# Patient Record
Sex: Female | Born: 2000 | Race: White | Hispanic: No | Marital: Single | Smoking: Never smoker
Health system: Southern US, Community
[De-identification: ages and names within clinical notes are randomized; demographics above are authoritative.]

## PROBLEM LIST (undated history)

## (undated) DIAGNOSIS — Z789 Other specified health status: Secondary | ICD-10-CM

## (undated) HISTORY — PX: NO PAST SURGERIES: SHX2092

---
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
## 2021-01-02 ENCOUNTER — Emergency Department (HOSPITAL_COMMUNITY)
Admission: EM | Admit: 2021-01-02 | Discharge: 2021-01-02 | Disposition: A | Discharge status: Home / Self Care | Payer: Medicaid Other | Attending: Emergency Medicine | Admitting: Emergency Medicine

## 2021-01-02 ENCOUNTER — Other Ambulatory Visit: Payer: Self-pay

## 2021-01-02 ENCOUNTER — Emergency Department (HOSPITAL_COMMUNITY): Payer: Medicaid Other

## 2021-01-02 DIAGNOSIS — Z3A01 Less than 8 weeks gestation of pregnancy: Secondary | ICD-10-CM | POA: Diagnosis not present

## 2021-01-02 DIAGNOSIS — E876 Hypokalemia: Secondary | ICD-10-CM | POA: Diagnosis not present

## 2021-01-02 DIAGNOSIS — Z3A11 11 weeks gestation of pregnancy: Secondary | ICD-10-CM | POA: Insufficient documentation

## 2021-01-02 DIAGNOSIS — O26891 Other specified pregnancy related conditions, first trimester: Secondary | ICD-10-CM | POA: Diagnosis present

## 2021-01-02 DIAGNOSIS — R7401 Elevation of levels of liver transaminase levels: Secondary | ICD-10-CM | POA: Diagnosis not present

## 2021-01-02 DIAGNOSIS — O219 Vomiting of pregnancy, unspecified: Secondary | ICD-10-CM | POA: Insufficient documentation

## 2021-01-02 DIAGNOSIS — R000 Tachycardia, unspecified: Secondary | ICD-10-CM | POA: Diagnosis not present

## 2021-01-02 DIAGNOSIS — Z419 Encounter for procedure for purposes other than remedying health state, unspecified: Secondary | ICD-10-CM | POA: Diagnosis not present

## 2021-01-02 DIAGNOSIS — R109 Unspecified abdominal pain: Secondary | ICD-10-CM | POA: Diagnosis not present

## 2021-01-02 DIAGNOSIS — R7989 Other specified abnormal findings of blood chemistry: Secondary | ICD-10-CM

## 2021-01-02 DIAGNOSIS — K760 Fatty (change of) liver, not elsewhere classified: Secondary | ICD-10-CM | POA: Diagnosis not present

## 2021-01-02 DIAGNOSIS — O99281 Endocrine, nutritional and metabolic diseases complicating pregnancy, first trimester: Secondary | ICD-10-CM | POA: Diagnosis not present

## 2021-01-02 LAB — HEPATITIS PANEL, ACUTE
HCV Ab: NONREACTIVE
Hep A IgM: NONREACTIVE
Hep B C IgM: NONREACTIVE
Hepatitis B Surface Ag: NONREACTIVE

## 2021-01-02 LAB — CBC WITH DIFFERENTIAL/PLATELET
Abs Immature Granulocytes: 0.04 10*3/uL (ref 0.00–0.07)
Basophils Absolute: 0.1 10*3/uL (ref 0.0–0.1)
Basophils Relative: 1 %
Eosinophils Absolute: 0.1 10*3/uL (ref 0.0–0.5)
Eosinophils Relative: 1 %
HCT: 42.7 % (ref 36.0–46.0)
Hemoglobin: 15 g/dL (ref 12.0–15.0)
Immature Granulocytes: 0 %
Lymphocytes Relative: 15 %
Lymphs Abs: 1.5 10*3/uL (ref 0.7–4.0)
MCH: 30.2 pg (ref 26.0–34.0)
MCHC: 35.1 g/dL (ref 30.0–36.0)
MCV: 86.1 fL (ref 80.0–100.0)
Monocytes Absolute: 1.1 10*3/uL — ABNORMAL HIGH (ref 0.1–1.0)
Monocytes Relative: 11 %
Neutro Abs: 7 10*3/uL (ref 1.7–7.7)
Neutrophils Relative %: 72 %
Platelets: 284 10*3/uL (ref 150–400)
RBC: 4.96 MIL/uL (ref 3.87–5.11)
RDW: 12.9 % (ref 11.5–15.5)
WBC: 9.7 10*3/uL (ref 4.0–10.5)
nRBC: 0 % (ref 0.0–0.2)

## 2021-01-02 LAB — COMPREHENSIVE METABOLIC PANEL
ALT: 219 U/L — ABNORMAL HIGH (ref 0–44)
AST: 101 U/L — ABNORMAL HIGH (ref 15–41)
Albumin: 3.8 g/dL (ref 3.5–5.0)
Alkaline Phosphatase: 131 U/L — ABNORMAL HIGH (ref 38–126)
Anion gap: 16 — ABNORMAL HIGH (ref 5–15)
BUN: 5 mg/dL — ABNORMAL LOW (ref 6–20)
CO2: 18 mmol/L — ABNORMAL LOW (ref 22–32)
Calcium: 9.4 mg/dL (ref 8.9–10.3)
Chloride: 97 mmol/L — ABNORMAL LOW (ref 98–111)
Creatinine, Ser: 0.77 mg/dL (ref 0.44–1.00)
GFR, Estimated: >60 mL/min (ref >60)
Glucose, Bld: 109 mg/dL — ABNORMAL HIGH (ref 70–99)
Potassium: 2.5 mmol/L — CL (ref 3.5–5.1)
Sodium: 131 mmol/L — ABNORMAL LOW (ref 135–145)
Total Bilirubin: 2.6 mg/dL — ABNORMAL HIGH (ref 0.3–1.2)
Total Protein: 7.7 g/dL (ref 6.5–8.1)

## 2021-01-02 LAB — LIPASE, BLOOD: Lipase: 37 U/L (ref 11–51)

## 2021-01-02 LAB — I-STAT BETA HCG BLOOD, ED (MC, WL, AP ONLY): I-stat hCG, quantitative: >2000 m[IU]/mL — ABNORMAL HIGH (ref <5)

## 2021-01-02 LAB — POTASSIUM: Potassium: 3 mmol/L — ABNORMAL LOW (ref 3.5–5.1)

## 2021-01-02 MED ORDER — POTASSIUM CHLORIDE 10 MEQ/100ML IV SOLN
10.0000 meq | INTRAVENOUS | Status: AC
  Administered 2021-01-02 (×2): 10 meq via INTRAVENOUS
  Filled 2021-01-02 (×2): qty 100

## 2021-01-02 MED ORDER — ONDANSETRON HCL 4 MG/2ML IJ SOLN
4.0000 mg | Freq: Once | INTRAMUSCULAR | Status: AC
  Administered 2021-01-02: 4 mg via INTRAVENOUS
  Filled 2021-01-02: qty 2

## 2021-01-02 MED ORDER — SODIUM CHLORIDE 0.9 % IV BOLUS
2000.0000 mL | Freq: Once | INTRAVENOUS | Status: AC
Start: 2021-01-02 — End: 2021-01-02
  Administered 2021-01-02: 2000 mL via INTRAVENOUS

## 2021-01-02 MED ORDER — POTASSIUM CHLORIDE CRYS ER 20 MEQ PO TBCR
40.0000 meq | EXTENDED_RELEASE_TABLET | Freq: Once | ORAL | Status: AC
  Administered 2021-01-02: 40 meq via ORAL
  Filled 2021-01-02: qty 2

## 2021-01-02 MED ORDER — DOXYLAMINE-PYRIDOXINE 10-10 MG PO TBEC: DELAYED_RELEASE_TABLET | ORAL | 1 refills | Status: DC

## 2021-01-02 NOTE — ED Provider Notes (Signed)
Emergency Medicine Provider OB Triage Evaluation Note  Robin Campbell is a 20 y.o. female, G1P0, at [redacted]w[redacted]d gestation who presents to the emergency department with complaints of nausea and vomiting x2 weeks.  Patient has tried prescription medications with no relief.  Denies associated abdominal pain.  No vaginal bleeding.  Patient has had a ultrasound and confirmed IUP per patient; however unable to review results in chart review.   Review of  Systems  Positive: nausea and vomiting Negative: vaginal bleeding  Physical Exam  LMP 10/15/2020 (Exact Date)  General: Awake, no distress  HEENT: Atraumatic  Resp: Normal effort  Cardiac: Normal rate Abd: Nondistended, nontender  MSK: Moves all extremities without difficulty Neuro: Speech clear  Medical Decision Making  Pt evaluated for pregnancy concern and is stable for transfer to MAU. Pt is in agreement with plan for transfer.  12:38 PM Discussed with MAU APP, Joni Reining  who would like patient worked up in the ED due to elevated HR. MAU is happy see patient once cardiac related issues are ruled out due to tachycardia.  Labs ordered EKG  Clinical Impression  No diagnosis found.     Jesusita Oka 01/02/21 1311    Gerhard Munch, MD 01/02/21 1705

## 2021-01-02 NOTE — ED Provider Notes (Addendum)
[facility] EMERGENCY DEPARTMENT Provider Note   CSN: [id number] Arrival date & time: 01/02/21  1200     History Chief Complaint  Patient presents with   Emesis During Pregnancy    Robin Campbell is a 20 y.o. female with past medical history significant for G1 P0 pregnancy approximately [redacted]w[redacted]d followed by left-sided OB/GYN who presents for evaluation nausea and vomiting.  This is been occurring over the last 2 weeks however worse over the last 48 hours.  States she has been able to keep down any liquids.  She has tried Zofran as well as likely just without relief.  She denies any abdominal pain, back pain, chest pain, shortness of breath, vaginal bleeding, fluid leakage.  She has had an ultrasound which confirmed IUP.  Denies additional aggravating or alleviating factors.   History obtained from patient and past medical records.  No interpreter is used.   HPI     No past medical history on file.  Patient Active Problem List   Diagnosis Date Noted   Encounter for supervision of normal first pregnancy in first trimester 12/11/2020    No past surgical history on file.   OB History     Gravida  1   Para      Term      Preterm      AB      Living         SAB      IAB      Ectopic      Multiple      Live Births              Family History  Problem Relation Age of Onset   Colon cancer Father    Throat cancer Father     Social History   Tobacco Use   Smoking status: Never    Passive exposure: Never   Smokeless tobacco: Never  Vaping Use   Vaping Use: Never used  Substance Use Topics   Alcohol use: Yes   Drug use: Never    Home Medications Prior to Admission medications   Medication Sig Start Date End Date Taking? Authorizing Provider  ondansetron (ZOFRAN ODT) 4 MG disintegrating tablet Take 1 tablet (4 mg total) by mouth every 6 (six) hours as needed for nausea. 12/20/20  Yes Nadara Mustard, MD    Allergies    Patient  has no known allergies.  Review of Systems   Review of Systems  Constitutional: Negative.   HENT: Negative.    Respiratory: Negative.    Cardiovascular: Negative.   Gastrointestinal:  Positive for nausea and vomiting. Negative for abdominal distention, abdominal pain, anal bleeding, blood in stool, constipation, diarrhea and rectal pain.  Genitourinary: Negative.   Musculoskeletal: Negative.   Skin: Negative.   Neurological: Negative.   All other systems reviewed and are negative.  Physical Exam Updated Vital Signs BP 95/62   Pulse 95   Temp 98.2 F (36.8 C) (Oral)   Resp 18   Ht 5\' 3"  (1.6 m)   Wt 58.1 kg   LMP 10/15/2020 (Exact Date)   SpO2 98%   BMI 22.67 kg/m   Physical Exam Vitals and nursing note reviewed.  Constitutional:      General: She is not in acute distress.    Appearance: She is well-developed. She is not ill-appearing, toxic-appearing or diaphoretic.  HENT:     Head: Normocephalic and atraumatic.     Nose: Nose normal.  Mouth/Throat:     Mouth: Mucous membranes are moist.  Eyes:     Pupils: Pupils are equal, round, and reactive to light.  Cardiovascular:     Rate and Rhythm: Normal rate.     Pulses: Normal pulses.     Heart sounds: Normal heart sounds.  Pulmonary:     Effort: Pulmonary effort is normal. No respiratory distress.     Breath sounds: Normal breath sounds.  Abdominal:     General: Bowel sounds are normal. There is no distension.     Palpations: Abdomen is soft.     Tenderness: There is no abdominal tenderness. There is no right CVA tenderness, left CVA tenderness, guarding or rebound. Negative signs include Murphy's sign and McBurney's sign.     Comments: FHT 166  Musculoskeletal:        General: No swelling, tenderness, deformity or signs of injury. Normal range of motion.     Cervical back: Normal range of motion.  Skin:    General: Skin is warm and dry.     Capillary Refill: Capillary refill takes less than 2 seconds.   Neurological:     General: No focal deficit present.     Mental Status: She is alert and oriented to person, place, and time.  Psychiatric:        Mood and Affect: Mood normal.    ED Results / Procedures / Treatments   Labs (all labs ordered are listed, but only abnormal results are displayed) Labs Reviewed  CBC WITH DIFFERENTIAL/PLATELET - Abnormal; Notable for the following components:      Result Value   Monocytes Absolute 1.1 (*)    All other components within normal limits  COMPREHENSIVE METABOLIC PANEL - Abnormal; Notable for the following components:   Sodium 131 (*)    Potassium 2.5 (*)    Chloride 97 (*)    CO2 18 (*)    Glucose, Bld 109 (*)    BUN 5 (*)    AST 101 (*)    ALT 219 (*)    Alkaline Phosphatase 131 (*)    Total Bilirubin 2.6 (*)    Anion gap 16 (*)    All other components within normal limits  I-STAT BETA HCG BLOOD, ED ([facility], [facility], AP ONLY) - Abnormal; Notable for the following components:   I-stat hCG, quantitative >2,000.0 (*)    All other components within normal limits  LIPASE, BLOOD  HEPATITIS PANEL, ACUTE  POTASSIUM    EKG EKG Interpretation  Date/Time:  Thursday January 02 2021 13:20:04 EDT Ventricular Rate:  139 PR Interval:  172 QRS Duration: 70 QT Interval:  284 QTC Calculation: 432 R Axis:   112 Text Interpretation: Suspect arm lead reversal, interpretation assumes no reversal Sinus tachycardia Biatrial enlargement Right axis deviation Abnormal ECG Confirmed by Gerhard Munch [phone]) on 01/02/2021 3:51:51 PM  Radiology US Abdomen Limited RUQ (LIVER/GB)  Result Date: 01/02/2021 CLINICAL DATA:  Elevated LFTs EXAM: ULTRASOUND ABDOMEN LIMITED RIGHT UPPER QUADRANT COMPARISON:  None. FINDINGS: Gallbladder: There is layering sludge in the gallbladder. No gallstones or wall thickening visualized. No sonographic Murphy sign noted by sonographer. Common bile duct: Diameter: 4 mm Liver: Parenchymal echogenicity is mildly increased. No focal  lesion is identified. There is no intrahepatic biliary ductal dilatation. Portal vein is patent on color Doppler imaging with normal direction of blood flow towards the liver. Other: None. IMPRESSION: 1. Mild hepatic steatosis. 2. Layering sludge in the gallbladder. No shadowing stones or evidence of acute cholecystitis. Electronically  Signed   By: Lesia Hausen M.D.   On: 01/02/2021 16:11    Procedures .Critical Care  Date/Time: 01/02/2021 6:19 PM Performed by: Linwood Dibbles, PA-C Authorized by: Linwood Dibbles, PA-C   Critical care provider statement:    Critical care time (minutes):  35   Critical care was necessary to treat or prevent imminent or life-threatening deterioration of the following conditions:  Metabolic crisis (severe hypokalemia)   Critical care was time spent personally by me on the following activities:  Discussions with consultants, evaluation of patient's response to treatment, examination of patient, ordering and performing treatments and interventions, ordering and review of laboratory studies, ordering and review of radiographic studies, pulse oximetry, re-evaluation of patient's condition, obtaining history from patient or surrogate and review of old charts   Medications Ordered in ED Medications  sodium chloride 0.9 % bolus 2,000 mL (0 mLs Intravenous Stopped 01/02/21 1718)  ondansetron (ZOFRAN) injection 4 mg (4 mg Intravenous Given 01/02/21 1507)  potassium chloride 10 mEq in 100 mL IVPB (0 mEq Intravenous Stopped 01/02/21 1718)  potassium chloride SA (KLOR-CON) CR tablet 40 mEq (40 mEq Oral Given 01/02/21 1717)   ED Course  I have reviewed the triage vital signs and the nursing notes.  Pertinent labs & imaging results that were available during my care of the patient were reviewed by me and considered in my medical decision making (see chart for details).  Here for evaluation of nausea vomiting in setting of pregnancy.  She is afebrile, nonseptic,  non-ill-appearing.  Apparently with triage exam MAU would not take patient as she was tachycardic.  Patient denies any chest pain, shortness of breath.  No clinical evidence of VTE on exam.  She does appear very dehydrated.  Suspect her tachycardia is due to this.  We will plan on basic labs, IV fluids, EKG and reassess  Labs personally reviewed and interpreted:  CBC without leukocytosis, hemoglobin 15.0 CMP sodium 131, potassium 2.5, elevated LFTs, anion gap 16 Lipase 37 EKG without ischemic changes, no prolonged qt interval Fetal heart tones 166  Patient reassessed. Discussed labs and imaging. Given IV and PO potassium. Discussed elevated LFT.  She continues to deny any abdominal pain.  Patient states her emesis has been consistent throughout her pregnancy.  Discussed ultrasound with possible hepatic steatosis with some layering however no discrete stones.  Exam not concerning for cholelithiasis, choledocholithiasis.  Patient does state that she does eat fast food very frequently.  Discussed close follow-up with PCP for repeat of her labs.  We will plan on repeat her potassium to ensure this is increasing.  With regards to her emesis she has not had any emesis here in the emergency department.  She is gotten 2 L of IV fluids.  She continues to deny any abdominal pain, vaginal discharge or bleeding.  Collected hepatitis panel.  The patient's tachycardia likely due to dehydration.  I have low suspicion for sepsis, PE as cause of this. No HA, vision changes, abd pain, HTN here to suggest Pre E as cause of her Lft elevation.  Reassessed.  Tolerating p.o. intake.  I offered admission to patient as she does appear dehydrated with significant hypokalemia however patient DECLINES and prefers to go home.  Care transferred to [facility], PA-C who will FU on repeat potassium. If improved can dc home with zofran/ potassium rx and close FU with PCP and Obgyn for repeat labs.     MDM Rules/Calculators/A&P  Final Clinical Impression(s) / ED Diagnoses Final diagnoses:  LFT elevation  Nausea and vomiting during pregnancy  Hypokalemia    Rx / DC Orders ED Discharge Orders     None            Celinda Dethlefs A, PA-C 01/03/21 1025    Gerhard MunchLockwood, Robert, MD 01/07/21 1609

## 2021-01-02 NOTE — ED Triage Notes (Signed)
Patient complains of emesis during pregnancy for the last two weeks, she is 11 weeks and 4 days. Denies abdominal pain, denies vaginal bleeding.

## 2021-01-02 NOTE — ED Provider Notes (Addendum)
Care of the patient received from [facility].  Please see her note for full HPI.  In short, 19 year old G1P0 resents to the ER with complaints of hyperemesis.  She was found to be profoundly hypokalemic with a potassium of 2.5, and a small metabolic acidosis, tachycardic.  Her LFTs were also elevated, hepatitis panel sent off.  Abdominal ultrasound with some gallbladder sludge but no evidence of acute cholecystitis.  Patient was given IV fluids and IV potassium.  She was tolerating oral fluids well.  Care of the patient signed out pending repeat potassium.  Repeat potassium 3.0.  Patient continues to tolerate  oral fluids well.  Pt taking zofran, will prescribe Diclegis as per discussion w/ Dr. Adela Lank.  Stable for discharge at this time.  Case discussed with Dr. Adela Lank is agreeable to the above plan and disposition  Results for orders placed or performed during the hospital encounter of 01/02/21  CBC with Differential  Result Value Ref Range   WBC 9.7 4.0 - 10.5 K/uL   RBC 4.96 3.87 - 5.11 MIL/uL   Hemoglobin 15.0 12.0 - 15.0 g/dL   HCT 58.5 27.7 - 82.4 %   MCV 86.1 80.0 - 100.0 fL   MCH 30.2 26.0 - 34.0 pg   MCHC 35.1 30.0 - 36.0 g/dL   RDW 23.5 36.1 - 44.3 %   Platelets 284 150 - 400 K/uL   nRBC 0.0 0.0 - 0.2 %   Neutrophils Relative % 72 %   Neutro Abs 7.0 1.7 - 7.7 K/uL   Lymphocytes Relative 15 %   Lymphs Abs 1.5 0.7 - 4.0 K/uL   Monocytes Relative 11 %   Monocytes Absolute 1.1 (H) 0.1 - 1.0 K/uL   Eosinophils Relative 1 %   Eosinophils Absolute 0.1 0.0 - 0.5 K/uL   Basophils Relative 1 %   Basophils Absolute 0.1 0.0 - 0.1 K/uL   Immature Granulocytes 0 %   Abs Immature Granulocytes 0.04 0.00 - 0.07 K/uL  Comprehensive metabolic panel  Result Value Ref Range   Sodium 131 (L) 135 - 145 mmol/L   Potassium 2.5 (LL) 3.5 - 5.1 mmol/L   Chloride 97 (L) 98 - 111 mmol/L   CO2 18 (L) 22 - 32 mmol/L   Glucose, Bld 109 (H) 70 - 99 mg/dL   BUN 5 (L) 6 - 20 mg/dL   Creatinine, Ser  1.54 0.44 - 1.00 mg/dL   Calcium 9.4 8.9 - 00.8 mg/dL   Total Protein 7.7 6.5 - 8.1 g/dL   Albumin 3.8 3.5 - 5.0 g/dL   AST 676 (H) 15 - 41 U/L   ALT 219 (H) 0 - 44 U/L   Alkaline Phosphatase 131 (H) 38 - 126 U/L   Total Bilirubin 2.6 (H) 0.3 - 1.2 mg/dL   GFR, Estimated >19 >50 mL/min   Anion gap 16 (H) 5 - 15  Lipase, blood  Result Value Ref Range   Lipase 37 11 - 51 U/L  Potassium  Result Value Ref Range   Potassium 3.0 (L) 3.5 - 5.1 mmol/L  I-Stat Beta hCG blood, ED ([facility], [facility], [facility] only)  Result Value Ref Range   I-stat hCG, quantitative >2,000.0 (H) <5 mIU/mL   Comment 3           US Abdomen Limited RUQ (LIVER/GB)  Result Date: 01/02/2021 CLINICAL DATA:  Elevated LFTs EXAM: ULTRASOUND ABDOMEN LIMITED RIGHT UPPER QUADRANT COMPARISON:  None. FINDINGS: Gallbladder: There is layering sludge in the gallbladder. No gallstones or wall thickening  visualized. No sonographic Murphy sign noted by sonographer. Common bile duct: Diameter: 4 mm Liver: Parenchymal echogenicity is mildly increased. No focal lesion is identified. There is no intrahepatic biliary ductal dilatation. Portal vein is patent on color Doppler imaging with normal direction of blood flow towards the liver. Other: None. IMPRESSION: 1. Mild hepatic steatosis. 2. Layering sludge in the gallbladder. No shadowing stones or evidence of acute cholecystitis. Electronically Signed   By: Lesia Hausen M.D.   On: 01/02/2021 16:11              Melene Plan, DO 01/02/21 1950    Mare Ferrari, PA-C 01/02/21 1956    Melene Plan, DO 01/02/21 1956

## 2021-01-02 NOTE — Discharge Instructions (Addendum)
Your potassium was very low, continue to drink plenty of fluids and follow up with your OBGYN. Please return to the ER for any new or worsening symptoms. Take the prescribed medication for nausea as directed

## 2021-01-02 NOTE — ED Notes (Signed)
Pt transported to Ultrasound.  

## 2021-01-08 ENCOUNTER — Other Ambulatory Visit: Payer: Self-pay

## 2021-01-08 ENCOUNTER — Encounter: Payer: Self-pay | Admitting: Obstetrics and Gynecology

## 2021-01-08 ENCOUNTER — Ambulatory Visit (INDEPENDENT_AMBULATORY_CARE_PROVIDER_SITE_OTHER): Payer: Medicaid Other | Admitting: Obstetrics and Gynecology

## 2021-01-08 VITALS — BP 88/70 | Wt 123.0 lb

## 2021-01-08 DIAGNOSIS — Z1379 Encounter for other screening for genetic and chromosomal anomalies: Secondary | ICD-10-CM | POA: Diagnosis not present

## 2021-01-08 DIAGNOSIS — O210 Mild hyperemesis gravidarum: Secondary | ICD-10-CM

## 2021-01-08 DIAGNOSIS — Z113 Encounter for screening for infections with a predominantly sexual mode of transmission: Secondary | ICD-10-CM

## 2021-01-08 DIAGNOSIS — Z369 Encounter for antenatal screening, unspecified: Secondary | ICD-10-CM

## 2021-01-08 DIAGNOSIS — Z1159 Encounter for screening for other viral diseases: Secondary | ICD-10-CM

## 2021-01-08 DIAGNOSIS — Z3401 Encounter for supervision of normal first pregnancy, first trimester: Secondary | ICD-10-CM

## 2021-01-08 MED ORDER — ONDANSETRON 4 MG PO TBDP: 4.0000 mg | ORAL_TABLET | Freq: Four times a day (QID) | ORAL | 3 refills | Status: DC | PRN

## 2021-01-08 MED ORDER — PROMETHAZINE HCL 25 MG PO TABS: 25.0000 mg | ORAL_TABLET | Freq: Four times a day (QID) | ORAL | 3 refills | Status: DC | PRN

## 2021-01-08 NOTE — Progress Notes (Signed)
Routine Prenatal Care Visit  Subjective  Robin Campbell is a 20 y.o. G1P0 at [redacted]w[redacted]d being seen today for ongoing prenatal care.  She is currently monitored for the following issues for this low-risk pregnancy and has Encounter for supervision of normal first pregnancy in first trimester on their problem list.  ----------------------------------------------------------------------------------- Patient reports  that she has been having issues with nausea or vomiting.  She has very little desire to eat.  She reports that she has lost 30 pounds since her pregnancy began.  She is accompanied by her significant other who reports that she has not eaten in 19 days.  The patient reports that she had small amounts of water and ginger ale this morning.  She was previously hospitalized for nausea during pregnancy. She reports that she is using Zofran previously in pregnancy which helped control her nausea.  She is prescribed a different medication at the hospital but was not able to fill the prescription for this medication.  She reported that it was too expensive.  She has been able to obtain pregnancy Medicaid. Contractions: Not present. Vag. Bleeding: None.  Movement: Absent. Denies leaking of fluid.  ----------------------------------------------------------------------------------- The following portions of the patient's history were reviewed and updated as appropriate: allergies, current medications, past family history, past medical history, past social history, past surgical history and problem list. Problem list updated.   Objective  Blood pressure (!) 88/70, weight 123 lb (55.8 kg), last menstrual period 10/15/2020. Pregravid weight 147 lb (66.7 kg) Total Weight Gain -24 lb (-10.9 kg) Urinalysis:      Fetal Status: Fetal Heart Rate (bpm): 130   Movement: Absent     General:  Alert, oriented and cooperative. Patient is in no acute distress.  Skin: Skin is warm and dry. No rash noted.    Cardiovascular: Normal heart rate noted  Respiratory: Normal respiratory effort, no problems with respiration noted  Abdomen: Soft, gravid, appropriate for gestational age. Pain/Pressure: Absent     Pelvic:  Cervical exam deferred        Extremities: Normal range of motion.  Edema: None  Mental Status: Normal mood and affect. Normal behavior. Normal judgment and thought content.     Assessment   20 y.o. G1P0 at [redacted]w[redacted]d by  07/22/2021, by Last Menstrual Period presenting for routine prenatal visit  Plan   pregnancy 1 Problems (from 12/11/20 to present)     Problem Noted Resolved   Encounter for supervision of normal first pregnancy in first trimester 12/11/2020 by Tresea Mall, CNM No   Overview Addendum 01/08/2021 10:03 AM by Natale Milch, MD     Nursing Staff Provider  Office Location  [facility] Dating  LMP, confirmed 9 wk [place]  Language  English Anatomy US    Flu Vaccine   Genetic Screen  NIPS:   TDaP vaccine    Hgb A1C or  GTT Early : NA Third trimester :   Covid    LAB RESULTS   Rhogam   Blood Type     Feeding Plan  Antibody    Contraception  Rubella    Circumcision  RPR     Pediatrician   HBsAg NON REACTIVE (09/01 1830)   Support Person Boyfriend [place] HIV    Prenatal Classes Advised Varicella     GBS  (For PCN allergy, check sensitivities)   BTL Consent NO    VBAC Consent N/A Pap  Not of age       Pelvis Tested NA  I recommended inpatient admission for patient's severe hyperemesis.  Patient declined. I encouraged the patient to maintain hydration at home.  I recommended popsicles, sucking on hard candy, or chewing gum.  I recommended small frequent snacks trying to consume something every 2 hours.  Reviewed plan of care for management of nausea outpatient.  I sent prescriptions for Phenergan and Zofran to the pharmacy.  NOB and Maternit21 labs today.  Gestational age appropriate obstetric precautions including but not limited to vaginal  bleeding, contractions, leaking of fluid and fetal movement were reviewed in detail with the patient.    Return in about 2 weeks (around 01/22/2021) for ROB with MD.  Natale Milch MD [facility], [facility] 01/08/2021, 10:25 AM

## 2021-01-08 NOTE — Patient Instructions (Signed)
Initial steps to help :   B6 (pyridoxine) 25 mg,  3-4 times a day- 200 mg a day total Unisom (doxylamine) 25 mg at bedtime **B6 and Unisom are available as a combination prescription medications called diclegis and bonjesta  B1 (thiamin)  50-100 mg 1-2 a day-  100 mg a day total  Continue prenatal vitamin with iron and thiamin. If it is not tolerated switch to 1 mg of folic acid.  Can add medication for gastric reflux if needed.  Subsequent steps to be added to B1, B6, and Unisom:  Antihistamine (one of the following medications) Dramamine      25-50 mg every 4-6 hours Benadryl      25-50 mg every 4-6 hours Meclizine      25 mg every 6 hours  2. Dopamine Antagonist (one of the following medications) Metoclopramide  (Reglan)  5-10 mg every 6-8 hours         PO Promethazine   (Phenergan)   12.5-25 mg every 4-6 hours      PO or rectal Prochlorperazine  (Compazine)  5-10 mg every 6-8 hours     25mg BID rectally   Subsequent steps if there has still not been improvement in symptoms:  3. Daily stool softner:  Colace 100 mg twice a day  4. Ondansetron  (Zofran)   4-8 mg every 6-8 hours  Hyperemesis Gravidarum Hyperemesis gravidarum is a severe form of nausea and vomiting that happens during pregnancy. Hyperemesis is worse than morning sickness. It may cause you to have nausea or vomiting all day for many days. It may keep you from eating and drinking enough food and liquids, which can lead to dehydration, malnutrition, and weight loss. Hyperemesis usually occurs during the first half (the first 20 weeks) of pregnancy. It often goes away once a woman is in her second half of pregnancy. However, sometimes hyperemesis continues through anentire pregnancy. What are the causes? The cause of this condition is not known. It may be associated with: Changes in hormones in the body during pregnancy. Changes in the gastrointestinal system. Genetic or inherited conditions. What are the signs or  symptoms? Symptoms of this condition include: Severe nausea and vomiting that does not go away. Problems keeping food down. Weight loss. Loss of body fluid (dehydration). Loss of appetite. You may have no desire to eat or you may not like the food you have previously enjoyed. How is this diagnosed? This condition may be diagnosed based on your medical history, your symptoms,and a physical exam. You may also have other tests, including: Blood tests. Urine tests. Blood pressure tests. Ultrasound to look for problems with the placenta or to check if you are pregnant with more than one baby. How is this treated? This condition is managed by controlling symptoms. This may include: Following an eating plan. This can help to lessen nausea and vomiting. Treatments that do not use medicine. These include acupressure bracelets, hypnosis, and eating or drinking foods or fluids that contain ginger, ginger ale, or ginger tea. Taking prescription medicine or over-the-counter medicine as told by your health care provider. Continuing to take prenatal vitamins. You may need to change what kind you take and when you take them. Follow your health care provider's instructions about prenatal vitamins. An eating plan and medicines are often used together to help control symptoms. If medicines do not help relieve nausea and vomiting, you may need to receivefluids through an IV at the hospital. Follow these instructions at home: To help   hospital. Follow these instructions at home: To help relieve your symptoms, listen to your body. Everyone is different and has different preferences. Find what works best for you. Here are some things you can try to help relieve your symptoms: Meals and snacks  Eat 5-6 small meals daily instead of 3 large meals. Eating small meals and snacks can help you avoid an empty stomach. Before getting out of bed, eat a couple of crackers to avoid moving around on an empty stomach. Eat a protein-rich snack before bed. Examples include  cheese and crackers, or a peanut butter sandwich made with 1 slice of whole-wheat bread and 1 tsp (5 g) of peanut butter. Eat and drink slowly. Try eating starchy foods as these are usually tolerated well. Examples include cereal, toast, bread, potatoes, pasta, rice, and pretzels. Eat at least one serving of protein with your meals and snacks. Protein options include lean meats, poultry, seafood, beans, nuts, nut butters, eggs, cheese, and yogurt. Eat or suck on things that have ginger in them. It may help to relieve nausea. Add  tsp (0.44 g) ground ginger to hot tea, or choose ginger tea. Fluids It is important to stay hydrated. Try to: Drink small amounts of fluids often. Drink fluids 30 minutes before or after a meal to help lessen the feeling of a full stomach. Drink 100% fruit juice or an electrolyte drink. An electrolyte drink contains sodium, potassium, and chloride. Drink fluids that are cold, clear, and carbonated or sour. These include lemonade, ginger ale, lemon-lime soda, ice water, and sparkling water. Things to avoid Avoid the following: Eating foods that trigger your symptoms. These may include spicy foods, coffee, high-fat foods, very sweet foods, and acidic foods. Drinking more than 1 cup of fluid at a time. Skipping meals. Nausea can be more intense on an empty stomach. If you cannot tolerate food, do not force it. Try sucking on ice chips or other frozen items and make up for missed calories later. Lying down within 2 hours after eating. Being exposed to environmental triggers. These may include food smells, smoky rooms, closed spaces, rooms with strong smells, warm or humid places, overly loud and noisy rooms, and rooms with motion or flickering lights. Try eating meals in a well-ventilated area that is free of strong smells. Making quick and sudden changes in your movement. Taking iron pills and multivitamins that contain iron. If you take prescription iron pills, do not stop  taking them unless your health care provider approves. Preparing food. The smell of food can spoil your appetite or trigger nausea. General instructions Brush your teeth or use a mouth rinse after meals. Take over-the-counter and prescription medicines only as told by your health care provider. Follow instructions from your health care provider about eating or drinking restrictions. Talk with your health care provider about starting a supplement of vitamin B6. Continue to take your prenatal vitamins as told by your health care provider. If you are having trouble taking your prenatal vitamins, talk with your health care provider about other options. Keep all follow-up visits. This is important. Follow-up visits include prenatal visits. Contact a health care provider if: You have pain in your abdomen. You have a severe headache. You have vision problems. You are losing weight. You feel weak or dizzy. You cannot eat or drink without vomiting, especially if this goes on for a full day. Get help right away if: You cannot drink fluids without vomiting. You vomit blood. You have constant nausea and vomiting.  a fever and your symptoms suddenly get worse. Summary Hyperemesis gravidarum is a severe form of nausea and vomiting that happens during pregnancy. Making some changes to your eating habits may help relieve nausea and vomiting. This condition may be managed with lifestyle changes and medicines as prescribed by your health care provider. If medicines do not help relieve nausea and vomiting, you may need to receive fluids through an IV at the hospital. This information is not intended to replace advice given to you by your health care provider. Make sure you discuss any questions you have with your healthcare provider. Document Revised: 11/13/2019 Document Reviewed: 11/13/2019 Elsevier Patient Education  2022 [employer]  

## 2021-01-08 NOTE — Progress Notes (Signed)
C/o needs refill of zofran; doesn't feel well today; very nauseous; hard to eat and keep liquids down.

## 2021-01-09 LAB — HEPATITIS C ANTIBODY: Hep C Virus Ab: <0.1 s/co ratio (ref 0.0–0.9)

## 2021-01-09 LAB — RPR+RH+ABO+RUB AB+AB SCR+CB...
Antibody Screen: NEGATIVE
HIV Screen 4th Generation wRfx: NONREACTIVE
Hematocrit: 43.1 % (ref 34.0–46.6)
Hemoglobin: 14.6 g/dL (ref 11.1–15.9)
Hepatitis B Surface Ag: NEGATIVE
MCH: 29.9 pg (ref 26.6–33.0)
MCHC: 33.9 g/dL (ref 31.5–35.7)
MCV: 88 fL (ref 79–97)
Platelets: 255 10*3/uL (ref 150–450)
RBC: 4.89 x10E6/uL (ref 3.77–5.28)
RDW: 13.5 % (ref 11.7–15.4)
RPR Ser Ql: NONREACTIVE
Rh Factor: POSITIVE
Rubella Antibodies, IGG: 3.95 index (ref >0.99)
Varicella zoster IgG: 903 index (ref >165)
WBC: 8.9 10*3/uL (ref 3.4–10.8)

## 2021-01-11 ENCOUNTER — Inpatient Hospital Stay
Admission: EM | Admit: 2021-01-11 | Discharge: 2021-01-15 | DRG: 833 | Disposition: A | Discharge status: Home / Self Care | Payer: Medicaid Other | Attending: Obstetrics and Gynecology | Admitting: Obstetrics and Gynecology

## 2021-01-11 ENCOUNTER — Other Ambulatory Visit: Payer: Self-pay

## 2021-01-11 DIAGNOSIS — Z20822 Contact with and (suspected) exposure to covid-19: Secondary | ICD-10-CM | POA: Diagnosis present

## 2021-01-11 DIAGNOSIS — O99891 Other specified diseases and conditions complicating pregnancy: Secondary | ICD-10-CM | POA: Diagnosis present

## 2021-01-11 DIAGNOSIS — O210 Mild hyperemesis gravidarum: Secondary | ICD-10-CM | POA: Diagnosis not present

## 2021-01-11 DIAGNOSIS — R000 Tachycardia, unspecified: Secondary | ICD-10-CM | POA: Diagnosis present

## 2021-01-11 DIAGNOSIS — R1110 Vomiting, unspecified: Secondary | ICD-10-CM

## 2021-01-11 DIAGNOSIS — O99281 Endocrine, nutritional and metabolic diseases complicating pregnancy, first trimester: Secondary | ICD-10-CM | POA: Diagnosis not present

## 2021-01-11 DIAGNOSIS — Z880 Allergy status to penicillin: Secondary | ICD-10-CM

## 2021-01-11 DIAGNOSIS — O2390 Unspecified genitourinary tract infection in pregnancy, unspecified trimester: Secondary | ICD-10-CM | POA: Diagnosis not present

## 2021-01-11 DIAGNOSIS — O211 Hyperemesis gravidarum with metabolic disturbance: Secondary | ICD-10-CM | POA: Diagnosis not present

## 2021-01-11 DIAGNOSIS — R7401 Elevation of levels of liver transaminase levels: Secondary | ICD-10-CM

## 2021-01-11 DIAGNOSIS — Z3403 Encounter for supervision of normal first pregnancy, third trimester: Secondary | ICD-10-CM

## 2021-01-11 DIAGNOSIS — E860 Dehydration: Secondary | ICD-10-CM

## 2021-01-11 DIAGNOSIS — B9689 Other specified bacterial agents as the cause of diseases classified elsewhere: Secondary | ICD-10-CM | POA: Diagnosis not present

## 2021-01-11 DIAGNOSIS — E876 Hypokalemia: Secondary | ICD-10-CM

## 2021-01-11 DIAGNOSIS — Z3401 Encounter for supervision of normal first pregnancy, first trimester: Secondary | ICD-10-CM

## 2021-01-11 DIAGNOSIS — Z3A12 12 weeks gestation of pregnancy: Secondary | ICD-10-CM | POA: Diagnosis not present

## 2021-01-11 DIAGNOSIS — R824 Acetonuria: Secondary | ICD-10-CM

## 2021-01-11 DIAGNOSIS — R9431 Abnormal electrocardiogram [ECG] [EKG]: Secondary | ICD-10-CM | POA: Diagnosis not present

## 2021-01-11 DIAGNOSIS — R8271 Bacteriuria: Secondary | ICD-10-CM

## 2021-01-11 LAB — CBC
HCT: 40 % (ref 36.0–46.0)
Hemoglobin: 15 g/dL (ref 12.0–15.0)
MCH: 31.1 pg (ref 26.0–34.0)
MCHC: 37.5 g/dL — ABNORMAL HIGH (ref 30.0–36.0)
MCV: 82.8 fL (ref 80.0–100.0)
Platelets: 258 10*3/uL (ref 150–400)
RBC: 4.83 MIL/uL (ref 3.87–5.11)
RDW: 13 % (ref 11.5–15.5)
WBC: 9.3 10*3/uL (ref 4.0–10.5)
nRBC: 0 % (ref 0.0–0.2)

## 2021-01-11 LAB — RESP PANEL BY RT-PCR (FLU A&B, COVID) ARPGX2
Influenza A by PCR: NEGATIVE
Influenza B by PCR: NEGATIVE
SARS Coronavirus 2 by RT PCR: NEGATIVE

## 2021-01-11 LAB — COMPREHENSIVE METABOLIC PANEL
ALT: 332 U/L — ABNORMAL HIGH (ref 0–44)
AST: 191 U/L — ABNORMAL HIGH (ref 15–41)
Albumin: 3.8 g/dL (ref 3.5–5.0)
Alkaline Phosphatase: 111 U/L (ref 38–126)
Anion gap: 14 (ref 5–15)
BUN: <5 mg/dL — ABNORMAL LOW (ref 6–20)
CO2: 26 mmol/L (ref 22–32)
Calcium: 9.2 mg/dL (ref 8.9–10.3)
Chloride: 90 mmol/L — ABNORMAL LOW (ref 98–111)
Creatinine, Ser: 0.54 mg/dL (ref 0.44–1.00)
GFR, Estimated: >60 mL/min (ref >60)
Glucose, Bld: 119 mg/dL — ABNORMAL HIGH (ref 70–99)
Potassium: 2.2 mmol/L — CL (ref 3.5–5.1)
Sodium: 130 mmol/L — ABNORMAL LOW (ref 135–145)
Total Bilirubin: 1.7 mg/dL — ABNORMAL HIGH (ref 0.3–1.2)
Total Protein: 8 g/dL (ref 6.5–8.1)

## 2021-01-11 LAB — MAGNESIUM: Magnesium: 1.8 mg/dL (ref 1.7–2.4)

## 2021-01-11 LAB — HCG, QUANTITATIVE, PREGNANCY: hCG, Beta Chain, Quant, S: 143643 m[IU]/mL — ABNORMAL HIGH (ref <5)

## 2021-01-11 LAB — ACETAMINOPHEN LEVEL: Acetaminophen (Tylenol), Serum: <10 ug/mL — ABNORMAL LOW (ref 10–30)

## 2021-01-11 LAB — LIPASE, BLOOD: Lipase: 36 U/L (ref 11–51)

## 2021-01-11 MED ORDER — THIAMINE HCL 100 MG/ML IJ SOLN
INTRAVENOUS | Status: AC
  Filled 2021-01-11 (×3): qty 1000

## 2021-01-11 MED ORDER — PYRIDOXINE HCL 100 MG/ML IJ SOLN
100.0000 mg | Freq: Once | INTRAMUSCULAR | Status: AC
  Administered 2021-01-11: 100 mg via INTRAVENOUS
  Filled 2021-01-11: qty 1

## 2021-01-11 MED ORDER — ONDANSETRON HCL 4 MG/2ML IJ SOLN: 4.0000 mg | Freq: Three times a day (TID) | INTRAMUSCULAR | Status: DC | PRN

## 2021-01-11 MED ORDER — PROMETHAZINE HCL 25 MG PO TABS
25.0000 mg | ORAL_TABLET | Freq: Once | ORAL | Status: AC
  Administered 2021-01-11: 25 mg via ORAL
  Filled 2021-01-11: qty 1

## 2021-01-11 MED ORDER — METHYLPREDNISOLONE 4 MG PO TABS
16.0000 mg | ORAL_TABLET | Freq: Every day | ORAL | Status: AC
  Administered 2021-01-12 – 2021-01-14 (×3): 16 mg via ORAL
  Filled 2021-01-11 (×3): qty 4

## 2021-01-11 MED ORDER — METHYLPREDNISOLONE 4 MG PO TABS: 4.0000 mg | ORAL_TABLET | Freq: Every day | ORAL | Status: DC

## 2021-01-11 MED ORDER — METHYLPREDNISOLONE SODIUM SUCC 125 MG IJ SOLR
48.0000 mg | Freq: Once | INTRAMUSCULAR | Status: AC
  Administered 2021-01-12: 48 mg via INTRAVENOUS
  Filled 2021-01-11: qty 0.77

## 2021-01-11 MED ORDER — SODIUM CHLORIDE 0.9 % IV SOLN
8.0000 mg | Freq: Three times a day (TID) | INTRAVENOUS | Status: DC | PRN
  Filled 2021-01-11: qty 4

## 2021-01-11 MED ORDER — ALUM & MAG HYDROXIDE-SIMETH 200-200-20 MG/5ML PO SUSP
30.0000 mL | Freq: Once | ORAL | Status: AC
  Administered 2021-01-11: 30 mL via ORAL
  Filled 2021-01-11: qty 30

## 2021-01-11 MED ORDER — VITAMIN B-6 50 MG PO TABS
25.0000 mg | ORAL_TABLET | Freq: Two times a day (BID) | ORAL | Status: DC
  Administered 2021-01-12 – 2021-01-15 (×8): 25 mg via ORAL
  Filled 2021-01-11 (×10): qty 0.5

## 2021-01-11 MED ORDER — POTASSIUM CHLORIDE 10 MEQ/100ML IV SOLN
10.0000 meq | INTRAVENOUS | Status: DC
  Administered 2021-01-11 (×2): 10 meq via INTRAVENOUS
  Filled 2021-01-11 (×2): qty 100

## 2021-01-11 MED ORDER — METHYLPREDNISOLONE 4 MG PO TABS
8.0000 mg | ORAL_TABLET | Freq: Every day | ORAL | Status: DC
  Filled 2021-01-11: qty 2

## 2021-01-11 MED ORDER — METHYLPREDNISOLONE 16 MG PO TABS: 16.0000 mg | ORAL_TABLET | Freq: Every day | ORAL | Status: DC

## 2021-01-11 MED ORDER — METHYLPREDNISOLONE 4 MG PO TABS
4.0000 mg | ORAL_TABLET | Freq: Every day | ORAL | Status: DC
  Filled 2021-01-11: qty 1

## 2021-01-11 MED ORDER — FAMOTIDINE 20 MG PO TABS
20.0000 mg | ORAL_TABLET | Freq: Two times a day (BID) | ORAL | Status: DC
  Administered 2021-01-11 – 2021-01-15 (×8): 20 mg via ORAL
  Filled 2021-01-11 (×8): qty 1

## 2021-01-11 MED ORDER — CEPHALEXIN 500 MG PO CAPS
500.0000 mg | ORAL_CAPSULE | Freq: Once | ORAL | Status: AC
  Administered 2021-01-11: 500 mg via ORAL
  Filled 2021-01-11: qty 1

## 2021-01-11 MED ORDER — METHYLPREDNISOLONE 4 MG PO TABS
8.0000 mg | ORAL_TABLET | Freq: Every day | ORAL | Status: DC
  Administered 2021-01-14: 8 mg via ORAL
  Filled 2021-01-11 (×4): qty 2

## 2021-01-11 MED ORDER — METHYLPREDNISOLONE 4 MG PO TABS
16.0000 mg | ORAL_TABLET | Freq: Every day | ORAL | Status: AC
  Administered 2021-01-12 – 2021-01-15 (×4): 16 mg via ORAL
  Filled 2021-01-11 (×6): qty 4

## 2021-01-11 MED ORDER — DOXYLAMINE SUCCINATE (SLEEP) 25 MG PO TABS
25.0000 mg | ORAL_TABLET | ORAL | Status: AC
  Administered 2021-01-11: 25 mg via ORAL
  Filled 2021-01-11: qty 1

## 2021-01-11 MED ORDER — POTASSIUM CHLORIDE CRYS ER 20 MEQ PO TBCR
40.0000 meq | EXTENDED_RELEASE_TABLET | Freq: Once | ORAL | Status: AC
  Administered 2021-01-11: 40 meq via ORAL
  Filled 2021-01-11: qty 2

## 2021-01-11 MED ORDER — ONDANSETRON HCL 4 MG/2ML IJ SOLN
4.0000 mg | Freq: Once | INTRAMUSCULAR | Status: AC
  Administered 2021-01-11: 4 mg via INTRAVENOUS
  Filled 2021-01-11: qty 2

## 2021-01-11 MED ORDER — PROMETHAZINE HCL 25 MG RE SUPP
12.5000 mg | RECTAL | Status: DC | PRN
  Filled 2021-01-11: qty 1

## 2021-01-11 MED ORDER — METHYLPREDNISOLONE 4 MG PO TABS: 8.0000 mg | ORAL_TABLET | Freq: Every day | ORAL | Status: DC

## 2021-01-11 MED ORDER — DOXYLAMINE SUCCINATE (SLEEP) 25 MG PO TABS
25.0000 mg | ORAL_TABLET | Freq: Two times a day (BID) | ORAL | Status: DC
  Administered 2021-01-12 – 2021-01-15 (×8): 25 mg via ORAL
  Filled 2021-01-11 (×10): qty 1

## 2021-01-11 MED ORDER — DEXTROSE IN LACTATED RINGERS 5 % IV SOLN: INTRAVENOUS | Status: DC

## 2021-01-11 MED ORDER — LACTATED RINGERS IV BOLUS
2000.0000 mL | Freq: Once | INTRAVENOUS | Status: AC
  Administered 2021-01-11: 2000 mL via INTRAVENOUS

## 2021-01-11 MED ORDER — PROMETHAZINE HCL 25 MG PO TABS
12.5000 mg | ORAL_TABLET | ORAL | Status: DC | PRN
  Filled 2021-01-11: qty 1

## 2021-01-11 MED ORDER — HYDROXYZINE HCL 25 MG PO TABS: 50.0000 mg | ORAL_TABLET | Freq: Four times a day (QID) | ORAL | Status: DC | PRN

## 2021-01-11 MED ORDER — FAMOTIDINE 20 MG IN NS 100 ML IVPB
20.0000 mg | Freq: Two times a day (BID) | INTRAVENOUS | Status: DC
  Filled 2021-01-11 (×4): qty 100

## 2021-01-11 MED ORDER — ONDANSETRON 4 MG PO TBDP: 4.0000 mg | ORAL_TABLET | Freq: Three times a day (TID) | ORAL | Status: DC | PRN

## 2021-01-11 MED ORDER — METHYLPREDNISOLONE 4 MG PO TABS
16.0000 mg | ORAL_TABLET | Freq: Every day | ORAL | Status: AC
  Administered 2021-01-12 – 2021-01-13 (×2): 16 mg via ORAL
  Filled 2021-01-11 (×4): qty 4

## 2021-01-11 MED ORDER — HYDROXYZINE HCL 50 MG/ML IM SOLN
50.0000 mg | Freq: Four times a day (QID) | INTRAMUSCULAR | Status: DC | PRN
  Filled 2021-01-11: qty 1

## 2021-01-11 MED ORDER — KCL IN DEXTROSE-NACL 20-5-0.45 MEQ/L-%-% IV SOLN
INTRAVENOUS | Status: DC
  Filled 2021-01-11 (×14): qty 1000

## 2021-01-11 NOTE — ED Notes (Signed)
Report given to Jackie RN

## 2021-01-11 NOTE — ED Triage Notes (Signed)
Pt to ED for emesis, [redacted] weeks pregnant. Has had px meds for emesis not helping. Reports unable to eat

## 2021-01-11 NOTE — H&P (Signed)
Obstetric H&P   Chief Complaint: Nausea and vomiting  Prenatal Care Provider: [facility]  History of Present Illness: 20 y.o. G1P0 [redacted]w[redacted]d by 07/22/2021, by Last Menstrual Period presenting to ER with symptoms of hyperemesis.  Patient noted to be tachycardic and hyperkalemic on presentation.  Symptoms were not controlled on home regimen of Dicelgis, Zofran, and promethazine.  Symptoms have been progressively worsening in the past week with currently no po intolerance.  She declined admission on 9/7.  She has lost 20lbs since start of her pregnancy.  No vaginal bleeding or other pregnancy concerns.    Pregravid weight 66.7 kg Total Weight Gain -10.9 kg  pregnancy 1 Problems (from 12/11/20 to present)     Problem Noted Resolved   Encounter for supervision of normal first pregnancy in first trimester 12/11/2020 by Tresea Mall, CNM No   Overview Addendum 01/08/2021 10:03 AM by Natale Milch, MD     Nursing Staff Provider  Office Location  [facility] Dating  LMP, confirmed 9 wk [place]  Language  English Anatomy US    Flu Vaccine   Genetic Screen  NIPS:   TDaP vaccine    Hgb A1C or  GTT Early : NA Third trimester :   Covid    LAB RESULTS   Rhogam   Blood Type  O pos  Feeding Plan  Antibody  neg  Contraception  Rubella Immune  Circumcision  RPR NR   Pediatrician   HBsAg NON REACTIVE (09/01 1830)   Support Person Boyfriend [place] HIV Negative   Prenatal Classes Advised Varicella Immune    GBS  (For PCN allergy, check sensitivities)   BTL Consent NO    VBAC Consent N/A Pap  Not of age       Pelvis Tested NA                Review of Systems: 10 point review of systems negative unless otherwise noted in HPI  Past Medical History: Patient Active Problem List   Diagnosis Date Noted   Hyperemesis affecting pregnancy, antepartum 01/11/2021   Encounter for supervision of normal first pregnancy in first trimester 12/11/2020     Nursing Staff Provider  Office Location  [facility] Dating   LMP, confirmed 9 wk [place]  Language  English Anatomy US    Flu Vaccine   Genetic Screen  NIPS:   TDaP vaccine    Hgb A1C or  GTT Early : NA Third trimester :   Covid    LAB RESULTS   Rhogam   Blood Type     Feeding Plan  Antibody    Contraception  Rubella    Circumcision  RPR     Pediatrician   HBsAg NON REACTIVE (09/01 1830)   Support Person Boyfriend [place] HIV    Prenatal Classes Advised Varicella     GBS  (For PCN allergy, check sensitivities)   BTL Consent NO    VBAC Consent N/A Pap  Not of age       Pelvis Tested NA          Past Surgical History: History reviewed. No pertinent surgical history.  Past Obstetric History: # 1 - Date: None, Sex: None, Weight: None, GA: None, Delivery: None, Apgar1: None, Apgar5: None, Living: None, Birth Comments: None   Past Gynecologic History:  Family History: Family History  Problem Relation Age of Onset   Colon cancer Father    Throat cancer Father     Social History: Social History  Socioeconomic History   Marital status: Single    Spouse name: Not on file   Number of children: Not on file   Years of education: Not on file   Highest education level: Not on file  Occupational History   Occupation: Retail  Tobacco Use   Smoking status: Never    Passive exposure: Never   Smokeless tobacco: Never  Vaping Use   Vaping Use: Never used  Substance and Sexual Activity   Alcohol use: Yes   Drug use: Never   Sexual activity: Yes    Partners: Male    Birth control/protection: None  Other Topics Concern   Not on file  Social History Narrative   Not on file   Social Determinants of Health   Financial Resource Strain: Not on file  Food Insecurity: Not on file  Transportation Needs: Not on file  Physical Activity: Not on file  Stress: Not on file  Social Connections: Not on file  Intimate Partner Violence: Not on file    Medications: Prior to Admission medications   Medication Sig Start Date End Date Taking?  Authorizing Provider  Doxylamine-Pyridoxine (DICLEGIS) 10-10 MG TBEC Initial, 2 tablets (doxylamine succinate 10 mg/pyridoxine hydrochloride 10 mg per tablet) orally at bedtime on day 1 and 2; if symptoms persist, take 1 tablet in morning and 2 tablets at bedtime on day 3; if symptoms persist, may increase to MAX 4 tablets per day, administered as 1 tablet in the morning, 1 tablet in mid-afternoon and 2 tablets at bedtime Patient not taking: Reported on 01/11/2021 01/02/21   Trudee GripBelaya, Maria A, PA-C  ondansetron (ZOFRAN ODT) 4 MG disintegrating tablet Take 1 tablet (4 mg total) by mouth every 6 (six) hours as needed for nausea. Patient not taking: Reported on 01/11/2021 12/20/20   Nadara MustardHarris, Robert P, MD  ondansetron (ZOFRAN ODT) 4 MG disintegrating tablet Take 1 tablet (4 mg total) by mouth every 6 (six) hours as needed for nausea. 01/08/21   Schuman, Jaquelyn Bitterhristanna R, MD  promethazine (PHENERGAN) 25 MG tablet Take 1 tablet (25 mg total) by mouth every 6 (six) hours as needed for nausea or vomiting. 01/08/21   Natale MilchSchuman, Christanna R, MD    Allergies: No Known Allergies  Physical Exam: Vitals: Blood pressure 107/63, pulse 100, temperature 99 F (37.2 C), temperature source Oral, resp. rate 19, height 5\' 3"  (1.6 m), weight 57.6 kg, last menstrual period 10/15/2020, SpO2 100 %.  General: NAD HEENT: normocephalic, anicteric Pulmonary: No increased work of breathing Cardiovascular: RRR, distal pulses 2+ Abdomen: soft, non-tender Extremities: no edema, erythema, or tenderness Neurologic: Grossly intact Psychiatric: mood appropriate, affect full  Labs: Results for orders placed or performed during the hospital encounter of 01/11/21 (from the past 24 hour(s))  Lipase, blood     Status: None   Collection Time: 01/11/21  3:57 PM  Result Value Ref Range   Lipase 36 11 - 51 U/L  Comprehensive metabolic panel     Status: Abnormal   Collection Time: 01/11/21  3:57 PM  Result Value Ref Range   Sodium 130 (L) 135 -  145 mmol/L   Potassium 2.2 (LL) 3.5 - 5.1 mmol/L   Chloride 90 (L) 98 - 111 mmol/L   CO2 26 22 - 32 mmol/L   Glucose, Bld 119 (H) 70 - 99 mg/dL   BUN <5 (L) 6 - 20 mg/dL   Creatinine, Ser 1.610.54 0.44 - 1.00 mg/dL   Calcium 9.2 8.9 - 09.610.3 mg/dL   Total Protein 8.0 6.5 -  8.1 g/dL   Albumin 3.8 3.5 - 5.0 g/dL   AST 175 (H) 15 - 41 U/L   ALT 332 (H) 0 - 44 U/L   Alkaline Phosphatase 111 38 - 126 U/L   Total Bilirubin 1.7 (H) 0.3 - 1.2 mg/dL   GFR, Estimated >10 >25 mL/min   Anion gap 14 5 - 15  CBC     Status: Abnormal   Collection Time: 01/11/21  3:57 PM  Result Value Ref Range   WBC 9.3 4.0 - 10.5 K/uL   RBC 4.83 3.87 - 5.11 MIL/uL   Hemoglobin 15.0 12.0 - 15.0 g/dL   HCT 85.2 77.8 - 24.2 %   MCV 82.8 80.0 - 100.0 fL   MCH 31.1 26.0 - 34.0 pg   MCHC 37.5 (H) 30.0 - 36.0 g/dL   RDW 35.3 61.4 - 43.1 %   Platelets 258 150 - 400 K/uL   nRBC 0.0 0.0 - 0.2 %  hCG, quantitative, pregnancy     Status: Abnormal   Collection Time: 01/11/21  3:57 PM  Result Value Ref Range   hCG, Beta Chain, Quant, S 143,643 (H) <5 mIU/mL  Magnesium     Status: None   Collection Time: 01/11/21  3:57 PM  Result Value Ref Range   Magnesium 1.8 1.7 - 2.4 mg/dL  Resp Panel by RT-PCR (Flu A&B, Covid) Nasopharyngeal Swab     Status: None   Collection Time: 01/11/21  4:12 PM   Specimen: Nasopharyngeal Swab; Nasopharyngeal(NP) swabs in vial transport medium  Result Value Ref Range   SARS Coronavirus 2 by RT PCR NEGATIVE NEGATIVE   Influenza A by PCR NEGATIVE NEGATIVE   Influenza B by PCR NEGATIVE NEGATIVE  Urinalysis, Complete w Microscopic     Status: Abnormal   Collection Time: 01/11/21  4:21 PM  Result Value Ref Range   Color, Urine YELLOW YELLOW   APPearance CLOUDY (A) CLEAR   Specific Gravity, Urine 1.015 1.005 - 1.030   pH 7.0 5.0 - 8.0   Glucose, UA NEGATIVE NEGATIVE mg/dL   Hgb urine dipstick TRACE (A) NEGATIVE   Bilirubin Urine SMALL (A) NEGATIVE   Ketones, ur >160 (A) NEGATIVE mg/dL    Protein, ur 7.0 (A) NEGATIVE mg/dL   Nitrite NEGATIVE NEGATIVE   Leukocytes,Ua SMALL (A) NEGATIVE   RBC / HPF 6-10 0 - 5 RBC/hpf   WBC, UA 21-50 0 - 5 WBC/hpf   Bacteria, UA FEW (A) NONE SEEN   Squamous Epithelial / LPF 11-20 0 - 5   Mucus PRESENT   Acetaminophen level     Status: Abnormal   Collection Time: 01/11/21  6:00 PM  Result Value Ref Range   Acetaminophen (Tylenol), Serum <10 (L) 10 - 30 ug/mL    Assessment: 20 y.o. G1P0 [redacted]w[redacted]d by 07/22/2021, by Last Menstrual Period with hyperemesis gravidarum  Plan: 1) Hyperemesis gravidarum - clear liquid diet advance as tolerated - D5 KCL at 127mL/hr and once daily banana bag - Repeat electrolytes including magnesium in AM - Prn Zofran, promethazine - Steroid taper - Continue vitamin B6 and doxylamine - Consider addition of reglan  2) Fetus - previable  3) PNL - Blood type O/Positive/-- (09/07 1047) / Anti-bodyscreen Negative (09/07 1047) / Rubella 3.95 (09/07 1047) / Varicella Immune / RPR Non Reactive (09/07 1047) / HBsAg Negative (09/07 1047) / HIV Non Reactive (09/07 1047)   4) Immunization History -   There is no immunization history on file for this patient.  5) Disposition -  pending po tolerance and improvement in symptoms  Vena Austria, MD, [facility], [facility] 01/11/2021, 9:17 PM

## 2021-01-11 NOTE — ED Provider Notes (Signed)
[facility] Emergency Department Provider Note  ____________________________________________   Event Date/Time   First MD Initiated Contact with Patient 01/11/21 [phone]     (approximate)  I have reviewed the triage vital signs and the nursing notes.   HISTORY  Chief Complaint Emesis   HPI Robin Campbell is a 20 y.o. female G1P0 at [redacted]w[redacted]d without other significant past medical history who presents for assessment of nausea and vomiting.  Patient states that is been a week since has been over keep anything down and she has been taking Zofran but this is not seem to help much.  She states she has began to feel lightheaded and dizzy.  She denies any headache, earache, chest pain, cough, fevers, abdominal pain, diarrhea, burning with urination, abnormal vaginal bleeding or discharge, back pain, rash or extremity pain.  No recent falls or injuries.  No other medications at this time.  She did note she was unable to fill her likely just prescribed Pregnancy due to cough.  Of note patient was evaluated by her OB/GYN on 9/7 and at that time was felt to be fairly dehydrated and her OB recommended admission but patient had declined.         History reviewed. No pertinent past medical history.  Patient Active Problem List   Diagnosis Date Noted   Encounter for supervision of normal first pregnancy in first trimester 12/11/2020    History reviewed. No pertinent surgical history.  Prior to Admission medications   Medication Sig Start Date End Date Taking? Authorizing Provider  Doxylamine-Pyridoxine (DICLEGIS) 10-10 MG TBEC Initial, 2 tablets (doxylamine succinate 10 mg/pyridoxine hydrochloride 10 mg per tablet) orally at bedtime on day 1 and 2; if symptoms persist, take 1 tablet in morning and 2 tablets at bedtime on day 3; if symptoms persist, may increase to MAX 4 tablets per day, administered as 1 tablet in the morning, 1 tablet in mid-afternoon and 2 tablets at  bedtime 01/02/21   Sharyn Lull A, PA-C  ondansetron (ZOFRAN ODT) 4 MG disintegrating tablet Take 1 tablet (4 mg total) by mouth every 6 (six) hours as needed for nausea. 12/20/20   Gae Dry, MD  ondansetron (ZOFRAN ODT) 4 MG disintegrating tablet Take 1 tablet (4 mg total) by mouth every 6 (six) hours as needed for nausea. 01/08/21   Schuman, Stefanie Libel, MD  promethazine (PHENERGAN) 25 MG tablet Take 1 tablet (25 mg total) by mouth every 6 (six) hours as needed for nausea or vomiting. 01/08/21   Homero Fellers, MD    Allergies Patient has no known allergies.  Family History  Problem Relation Age of Onset   Colon cancer Father    Throat cancer Father     Social History Social History   Tobacco Use   Smoking status: Never    Passive exposure: Never   Smokeless tobacco: Never  Vaping Use   Vaping Use: Never used  Substance Use Topics   Alcohol use: Yes   Drug use: Never    Review of Systems  Review of Systems  Constitutional:  Negative for chills and fever.  HENT:  Negative for sore throat.   Eyes:  Negative for pain.  Respiratory:  Negative for cough and stridor.   Cardiovascular:  Negative for chest pain.  Gastrointestinal:  Positive for nausea and vomiting.  Genitourinary:  Negative for dysuria.  Musculoskeletal:  Negative for myalgias.  Skin:  Negative for rash.  Neurological:  Negative for seizures, loss of consciousness and  headaches.  Psychiatric/Behavioral:  Negative for suicidal ideas.   All other systems reviewed and are negative.  ____________________________________________   PHYSICAL EXAM:  VITAL SIGNS: ED Triage Vitals  Enc Vitals Group     BP 01/11/21 1554 113/71     Pulse Rate 01/11/21 1554 (!) 127     Resp 01/11/21 1554 20     Temp 01/11/21 1554 97.7 F (36.5 C)     Temp Source 01/11/21 1554 Oral     SpO2 01/11/21 1554 98 %     Weight 01/11/21 1555 123 lb (55.8 kg)     Height 01/11/21 1555 $RemoveBefor'5\' 3"'[id number]$  (1.6 m)     Head Circumference --       Peak Flow --      Pain Score 01/11/21 1554 0     Pain Loc --      Pain Edu? --      Excl. in [place]? --    Vitals:   01/11/21 1712 01/11/21 1800  BP: 103/72 101/69  Pulse: 97 99  Resp: 18 12  Temp:    SpO2: 100% 100%   Physical Exam Vitals and nursing note reviewed.  Constitutional:      General: She is not in acute distress.    Appearance: She is well-developed.  HENT:     Head: Normocephalic and atraumatic.     Right Ear: External ear normal.     Left Ear: External ear normal.     Nose: Nose normal.     Mouth/Throat:     Mouth: Mucous membranes are dry.  Eyes:     Conjunctiva/sclera: Conjunctivae normal.  Cardiovascular:     Rate and Rhythm: Regular rhythm. Tachycardia present.     Heart sounds: No murmur heard. Pulmonary:     Effort: Pulmonary effort is normal. No respiratory distress.     Breath sounds: Normal breath sounds.  Abdominal:     Palpations: Abdomen is soft.     Tenderness: There is no abdominal tenderness. There is no right CVA tenderness or left CVA tenderness.  Musculoskeletal:     Cervical back: Neck supple.     Right lower leg: No edema.     Left lower leg: No edema.  Skin:    General: Skin is warm and dry.     Capillary Refill: Capillary refill takes more than 3 seconds.  Neurological:     Mental Status: She is alert and oriented to person, place, and time.  Psychiatric:        Mood and Affect: Mood normal.    ____________________________________________   LABS (all labs ordered are listed, but only abnormal results are displayed)  Labs Reviewed  COMPREHENSIVE METABOLIC PANEL - Abnormal; Notable for the following components:      Result Value   Sodium 130 (*)    Potassium 2.2 (*)    Chloride 90 (*)    Glucose, Bld 119 (*)    BUN <5 (*)    AST 191 (*)    ALT 332 (*)    Total Bilirubin 1.7 (*)    All other components within normal limits  CBC - Abnormal; Notable for the following components:   MCHC 37.5 (*)    All other  components within normal limits  URINALYSIS, COMPLETE (UACMP) WITH MICROSCOPIC - Abnormal; Notable for the following components:   APPearance CLOUDY (*)    Hgb urine dipstick TRACE (*)    Bilirubin Urine SMALL (*)    Ketones, ur >160 (*)  Protein, ur 7.0 (*)    Leukocytes,Ua SMALL (*)    Bacteria, UA FEW (*)    All other components within normal limits  HCG, QUANTITATIVE, PREGNANCY - Abnormal; Notable for the following components:   hCG, Beta Chain, Quant, S [id number] (*)    All other components within normal limits  RESP PANEL BY RT-PCR (FLU A&B, COVID) [id number]  URINE CULTURE  LIPASE, BLOOD  MAGNESIUM  ACETAMINOPHEN LEVEL   ____________________________________________  EKG  ____________________________________________  RADIOLOGY  ED MD interpretation:    Official radiology report(s): No results found.  ____________________________________________   PROCEDURES  Procedure(s) performed (including Critical Care):  .1-3 Lead EKG Interpretation Performed by: Lucrezia Starch, MD Authorized by: Lucrezia Starch, MD     Interpretation: non-specific     ECG rate assessment: tachycardic     Rhythm: sinus tachycardia     Ectopy: none     Conduction: normal     ____________________________________________   INITIAL IMPRESSION / ASSESSMENT AND PLAN / ED COURSE      Patient presents with above-stated history and exam for assessment of persistent nausea and vomiting in early trimester pregnancy responding to Zofran.  Patient had also been prescribed Phenergan and that since she had not been taking this recently.  She also was seen by OB couple days ago recommend admission at that time the patient declined and now she states she has been feeling worse and is amenable to this if needed.  On arrival she is tachycardic at 127 with otherwise stable vital signs on room air.  On exam her lungs are clear bilaterally abdomen is soft but she does appear extremely dehydrated with  sunken eyes, tachycardia and prolonged capillary refill.  Differential includes hyperemesis related to pregnancy, metabolic derangements, UTI with low suspicion for SBO, appendicitis, diverticulitis or other emergent intra-abdominal pathology at this time.  She has no abdominal pain vaginal bleeding or discharge to suggest PID, endometritis or abruption.  CMP today shows a sodium of 130, K of 2.2 and a transaminitis with an AST of 191 and ALT of 332.  Alk phos is within normal limits and overall there is no significant tenderness of right upper quadrant to suggest acute cholestatic process or acute cholecystitis.  Unclear etiology for demise at this time less than possible is related to hyperemesis.  She recently had a hepatitis panel that was negative within the last month.  Suspicion for acute viral hepatitis.  We will set Tylenol level and the patient denies taking significant mount of Tylenol.  CBC shows no leukocytosis or acute anemia.  Magnesium is within normal limits.  COVID influenza PCR is negative.  hCG is appropriate at [phone].  Patient denies any urinary symptoms UA is concerning for possible cystitis versus asymptomatic bacteriuria given there are some bacteria noted with 21-50 WBCs and small excite esterase.  Patient also has greater 160 ketones consistent with her hyperemesis and very significant dehydration on arrival.  She was treated with 2 L of IV fluids as well as IV and potassium repletion.  She was given below noted antiemetics.  On reassessment after approximately 3 hours she was still slight tachycardic at 103 and states she developed some heartburn.  We will give her some Maalox and order dose of Keflex to treat what I suspect is asymptomatic bacteriuria at this time.  Given she is still tachycardic and degree of dehydration and hypokalemia I will admit to observation to OB/GYN service.      ____________________________________________   FINAL  CLINICAL IMPRESSION(S) / ED  DIAGNOSES  Final diagnoses:  Hyperemesis  Dehydration  Hypokalemia  Asymptomatic bacteriuria during pregnancy  Ketonuria  Transaminitis    Medications  potassium chloride 10 mEq in 100 mL IVPB (10 mEq Intravenous New Bag/Given 01/11/21 1829)  dextrose 5 % in lactated ringers infusion (has no administration in time range)  alum & mag hydroxide-simeth (MAALOX/MYLANTA) 200-200-20 MG/5ML suspension 30 mL (has no administration in time range)  lactated ringers bolus 2,000 mL (2,000 mLs Intravenous New Bag/Given 01/11/21 1637)  pyridOXINE (B-6) injection 100 mg (100 mg Intravenous Given 01/11/21 1634)  ondansetron (ZOFRAN) injection 4 mg (4 mg Intravenous Given 01/11/21 1634)  potassium chloride SA (KLOR-CON) CR tablet 40 mEq (40 mEq Oral Given 01/11/21 1713)  doxylamine (Sleep) (UNISOM) tablet 25 mg (25 mg Oral Given 01/11/21 1713)  promethazine (PHENERGAN) tablet 25 mg (25 mg Oral Given 01/11/21 1829)  cephALEXin (KEFLEX) capsule 500 mg (500 mg Oral Given 01/11/21 1846)     ED Discharge Orders     None        Note:  This document was prepared using Dragon voice recognition software and may include unintentional dictation errors.    Lucrezia Starch, MD 01/11/21 [phone]

## 2021-01-12 DIAGNOSIS — O210 Mild hyperemesis gravidarum: Secondary | ICD-10-CM | POA: Diagnosis not present

## 2021-01-12 DIAGNOSIS — R195 Other fecal abnormalities: Secondary | ICD-10-CM | POA: Diagnosis not present

## 2021-01-12 DIAGNOSIS — E876 Hypokalemia: Secondary | ICD-10-CM | POA: Diagnosis not present

## 2021-01-12 DIAGNOSIS — Z20822 Contact with and (suspected) exposure to covid-19: Secondary | ICD-10-CM | POA: Diagnosis not present

## 2021-01-12 DIAGNOSIS — R000 Tachycardia, unspecified: Secondary | ICD-10-CM | POA: Diagnosis not present

## 2021-01-12 DIAGNOSIS — Z8719 Personal history of other diseases of the digestive system: Secondary | ICD-10-CM | POA: Diagnosis not present

## 2021-01-12 DIAGNOSIS — Z3A13 13 weeks gestation of pregnancy: Secondary | ICD-10-CM | POA: Diagnosis not present

## 2021-01-12 DIAGNOSIS — O99281 Endocrine, nutritional and metabolic diseases complicating pregnancy, first trimester: Secondary | ICD-10-CM | POA: Diagnosis not present

## 2021-01-12 DIAGNOSIS — Z880 Allergy status to penicillin: Secondary | ICD-10-CM | POA: Diagnosis not present

## 2021-01-12 DIAGNOSIS — O99891 Other specified diseases and conditions complicating pregnancy: Secondary | ICD-10-CM | POA: Diagnosis not present

## 2021-01-12 DIAGNOSIS — R9431 Abnormal electrocardiogram [ECG] [EKG]: Secondary | ICD-10-CM | POA: Diagnosis not present

## 2021-01-12 DIAGNOSIS — O211 Hyperemesis gravidarum with metabolic disturbance: Secondary | ICD-10-CM | POA: Diagnosis not present

## 2021-01-12 DIAGNOSIS — Z3A12 12 weeks gestation of pregnancy: Secondary | ICD-10-CM | POA: Diagnosis not present

## 2021-01-12 DIAGNOSIS — B9689 Other specified bacterial agents as the cause of diseases classified elsewhere: Secondary | ICD-10-CM | POA: Diagnosis not present

## 2021-01-12 DIAGNOSIS — R109 Unspecified abdominal pain: Secondary | ICD-10-CM | POA: Diagnosis not present

## 2021-01-12 DIAGNOSIS — R748 Abnormal levels of other serum enzymes: Secondary | ICD-10-CM | POA: Diagnosis not present

## 2021-01-12 DIAGNOSIS — O2390 Unspecified genitourinary tract infection in pregnancy, unspecified trimester: Secondary | ICD-10-CM | POA: Diagnosis not present

## 2021-01-12 LAB — URINALYSIS, COMPLETE (UACMP) WITH MICROSCOPIC
Glucose, UA: NEGATIVE mg/dL
Ketones, ur: >160 mg/dL — AB
Nitrite: NEGATIVE
Protein, ur: 30 mg/dL — AB
Specific Gravity, Urine: 1.015 (ref 1.005–1.030)
pH: 7 (ref 5.0–8.0)

## 2021-01-12 LAB — BASIC METABOLIC PANEL
Anion gap: 5 (ref 5–15)
BUN: <5 mg/dL — ABNORMAL LOW (ref 6–20)
CO2: 28 mmol/L (ref 22–32)
Calcium: 8.1 mg/dL — ABNORMAL LOW (ref 8.9–10.3)
Chloride: 99 mmol/L (ref 98–111)
Creatinine, Ser: 0.44 mg/dL (ref 0.44–1.00)
GFR, Estimated: >60 mL/min (ref >60)
Glucose, Bld: 161 mg/dL — ABNORMAL HIGH (ref 70–99)
Potassium: 3.1 mmol/L — ABNORMAL LOW (ref 3.5–5.1)
Sodium: 132 mmol/L — ABNORMAL LOW (ref 135–145)

## 2021-01-12 LAB — MAGNESIUM: Magnesium: 1.8 mg/dL (ref 1.7–2.4)

## 2021-01-12 NOTE — Progress Notes (Signed)
Pt states she is feeling much better throughout the day. She has ate a clear liquid tray for breakfast and lunch and kept all of it down with no nausea/vomiting.   No pain. Up ad lib. Tolerating all PO meds with no nausea/vomiting.

## 2021-01-12 NOTE — Progress Notes (Signed)
Obstetric and Gynecology  Subjective  Feeling better, kept down some broth.  Overall significant improvement in nausea  Objective  Vital signs in last 24 hours: Temp:  [97.7 F (36.5 C)-99 F (37.2 C)] 98.4 F (36.9 C) (09/11 0746) Pulse Rate:  [84-127] 84 (09/11 0407) Resp:  [12-20] 18 (09/11 0746) BP: (96-113)/(58-72) 96/61 (09/11 0746) SpO2:  [97 %-100 %] 99 % (09/11 0407) Weight:  [55.8 kg-57.6 kg] 57.6 kg (09/10 2040) Last BM Date: 12/23/20   Intake/Output Summary (Last 24 hours) at 01/12/2021 1121 Last data filed at 01/12/2021 1025 Gross per 24 hour  Intake 3462.5 ml  Output 1000 ml  Net 2462.5 ml    General: NAD Pulmonary: no increased work of breathing Extremities: no edema  Labs: Results for orders placed or performed during the hospital encounter of 01/11/21 (from the past 24 hour(s))  Lipase, blood     Status: None   Collection Time: 01/11/21  3:57 PM  Result Value Ref Range   Lipase 36 11 - 51 U/L  Comprehensive metabolic panel     Status: Abnormal   Collection Time: 01/11/21  3:57 PM  Result Value Ref Range   Sodium 130 (L) 135 - 145 mmol/L   Potassium 2.2 (LL) 3.5 - 5.1 mmol/L   Chloride 90 (L) 98 - 111 mmol/L   CO2 26 22 - 32 mmol/L   Glucose, Bld 119 (H) 70 - 99 mg/dL   BUN <5 (L) 6 - 20 mg/dL   Creatinine, Ser 4.48 0.44 - 1.00 mg/dL   Calcium 9.2 8.9 - 18.5 mg/dL   Total Protein 8.0 6.5 - 8.1 g/dL   Albumin 3.8 3.5 - 5.0 g/dL   AST 631 (H) 15 - 41 U/L   ALT 332 (H) 0 - 44 U/L   Alkaline Phosphatase 111 38 - 126 U/L   Total Bilirubin 1.7 (H) 0.3 - 1.2 mg/dL   GFR, Estimated >49 >70 mL/min   Anion gap 14 5 - 15  CBC     Status: Abnormal   Collection Time: 01/11/21  3:57 PM  Result Value Ref Range   WBC 9.3 4.0 - 10.5 K/uL   RBC 4.83 3.87 - 5.11 MIL/uL   Hemoglobin 15.0 12.0 - 15.0 g/dL   HCT 26.3 78.5 - 88.5 %   MCV 82.8 80.0 - 100.0 fL   MCH 31.1 26.0 - 34.0 pg   MCHC 37.5 (H) 30.0 - 36.0 g/dL   RDW 02.7 74.1 - 28.7 %   Platelets 258  150 - 400 K/uL   nRBC 0.0 0.0 - 0.2 %  hCG, quantitative, pregnancy     Status: Abnormal   Collection Time: 01/11/21  3:57 PM  Result Value Ref Range   hCG, Beta Chain, Quant, S 143,643 (H) <5 mIU/mL  Magnesium     Status: None   Collection Time: 01/11/21  3:57 PM  Result Value Ref Range   Magnesium 1.8 1.7 - 2.4 mg/dL  Resp Panel by RT-PCR (Flu A&B, Covid) Nasopharyngeal Swab     Status: None   Collection Time: 01/11/21  4:12 PM   Specimen: Nasopharyngeal Swab; Nasopharyngeal(NP) swabs in vial transport medium  Result Value Ref Range   SARS Coronavirus 2 by RT PCR NEGATIVE NEGATIVE   Influenza A by PCR NEGATIVE NEGATIVE   Influenza B by PCR NEGATIVE NEGATIVE  Urinalysis, Complete w Microscopic     Status: Abnormal   Collection Time: 01/11/21  4:21 PM  Result Value Ref Range   Color, Urine  YELLOW YELLOW   APPearance CLOUDY (A) CLEAR   Specific Gravity, Urine 1.015 1.005 - 1.030   pH 7.0 5.0 - 8.0   Glucose, UA NEGATIVE NEGATIVE mg/dL   Hgb urine dipstick TRACE (A) NEGATIVE   Bilirubin Urine SMALL (A) NEGATIVE   Ketones, ur >160 (A) NEGATIVE mg/dL   Protein, ur 30 (A) NEGATIVE mg/dL   Nitrite NEGATIVE NEGATIVE   Leukocytes,Ua SMALL (A) NEGATIVE   RBC / HPF 6-10 0 - 5 RBC/hpf   WBC, UA 21-50 0 - 5 WBC/hpf   Bacteria, UA FEW (A) NONE SEEN   Squamous Epithelial / LPF 11-20 0 - 5   Mucus PRESENT   Acetaminophen level     Status: Abnormal   Collection Time: 01/11/21  6:00 PM  Result Value Ref Range   Acetaminophen (Tylenol), Serum <10 (L) 10 - 30 ug/mL  Basic metabolic panel     Status: Abnormal   Collection Time: 01/12/21  5:05 AM  Result Value Ref Range   Sodium 132 (L) 135 - 145 mmol/L   Potassium 3.1 (L) 3.5 - 5.1 mmol/L   Chloride 99 98 - 111 mmol/L   CO2 28 22 - 32 mmol/L   Glucose, Bld 161 (H) 70 - 99 mg/dL   BUN <5 (L) 6 - 20 mg/dL   Creatinine, Ser 6.21 0.44 - 1.00 mg/dL   Calcium 8.1 (L) 8.9 - 10.3 mg/dL   GFR, Estimated >30 >86 mL/min   Anion gap 5 5 - 15   Magnesium     Status: None   Collection Time: 01/12/21  5:05 AM  Result Value Ref Range   Magnesium 1.8 1.7 - 2.4 mg/dL    Cultures: Results for orders placed or performed during the hospital encounter of 01/11/21  Resp Panel by RT-PCR (Flu A&B, Covid) Nasopharyngeal Swab     Status: None   Collection Time: 01/11/21  4:12 PM   Specimen: Nasopharyngeal Swab; Nasopharyngeal(NP) swabs in vial transport medium  Result Value Ref Range Status   SARS Coronavirus 2 by RT PCR NEGATIVE NEGATIVE Final    Comment: (NOTE) SARS-CoV-2 target nucleic acids are NOT DETECTED.  The SARS-CoV-2 RNA is generally detectable in upper respiratory specimens during the acute phase of infection. The lowest concentration of SARS-CoV-2 viral copies this assay can detect is 138 copies/mL. A negative result does not preclude SARS-Cov-2 infection and should not be used as the sole basis for treatment or other patient management decisions. A negative result may occur with  improper specimen collection/handling, submission of specimen other than nasopharyngeal swab, presence of viral mutation(s) within the areas targeted by this assay, and inadequate number of viral copies(<138 copies/mL). A negative result must be combined with clinical observations, patient history, and epidemiological information. The expected result is Negative.  Fact Sheet for Patients:  BloggerCourse.com  Fact Sheet for Healthcare Providers:  SeriousBroker.it  This test is no t yet approved or cleared by the [place] FDA and  has been authorized for detection and/or diagnosis of SARS-CoV-2 by FDA under an Emergency Use Authorization (EUA). This EUA will remain  in effect (meaning this test can be used) for the duration of the COVID-19 declaration under Section 564(b)(1) of the Act, 21 U.S.C.section 360bbb-3(b)(1), unless the authorization is terminated  or revoked sooner.        Influenza A by PCR NEGATIVE NEGATIVE Final   Influenza B by PCR NEGATIVE NEGATIVE Final    Comment: (NOTE) The Xpert Xpress SARS-CoV-2/FLU/RSV plus assay is intended as  an aid in the diagnosis of influenza from Nasopharyngeal swab specimens and should not be used as a sole basis for treatment. Nasal washings and aspirates are unacceptable for Xpert Xpress SARS-CoV-2/FLU/RSV testing.  Fact Sheet for Patients: BloggerCourse.com  Fact Sheet for Healthcare Providers: SeriousBroker.it  This test is not yet approved or cleared by the [place] FDA and has been authorized for detection and/or diagnosis of SARS-CoV-2 by FDA under an Emergency Use Authorization (EUA). This EUA will remain in effect (meaning this test can be used) for the duration of the COVID-19 declaration under Section 564(b)(1) of the Act, 21 U.S.C. section 360bbb-3(b)(1), unless the authorization is terminated or revoked.  Performed at [facility], [address] [place], [place] [address]     Imaging:  Assessment   20 y.o. G1P0 [redacted]w[redacted]d Estimated Date of Delivery: 07/22/21 with hyperemesis gravidarum  Plan   1) Hyperemesis gravidarum - clear liquid diet advance as tolerated - D5 KCL at 141mL/hr and once daily banana bag - Repeat electrolytes including magnesium in AM - Prn Zofran, promethazine - Steroid taper - Continue vitamin B6 and doxylamine - Consider addition of reglan if needed  2) Hyperkalemia - improving continue KCL in maintenance fluids

## 2021-01-13 DIAGNOSIS — O210 Mild hyperemesis gravidarum: Secondary | ICD-10-CM | POA: Diagnosis not present

## 2021-01-13 DIAGNOSIS — Z3A12 12 weeks gestation of pregnancy: Secondary | ICD-10-CM | POA: Diagnosis not present

## 2021-01-13 LAB — URINE CULTURE: Culture: NO GROWTH

## 2021-01-13 LAB — BASIC METABOLIC PANEL
Anion gap: 4 — ABNORMAL LOW (ref 5–15)
BUN: <5 mg/dL — ABNORMAL LOW (ref 6–20)
CO2: 29 mmol/L (ref 22–32)
Calcium: 7.9 mg/dL — ABNORMAL LOW (ref 8.9–10.3)
Chloride: 103 mmol/L (ref 98–111)
Creatinine, Ser: <0.3 mg/dL — ABNORMAL LOW (ref 0.44–1.00)
Glucose, Bld: 111 mg/dL — ABNORMAL HIGH (ref 70–99)
Potassium: 3 mmol/L — ABNORMAL LOW (ref 3.5–5.1)
Sodium: 136 mmol/L (ref 135–145)

## 2021-01-13 LAB — MAGNESIUM: Magnesium: 1.8 mg/dL (ref 1.7–2.4)

## 2021-01-13 MED ORDER — DOCUSATE SODIUM 100 MG PO CAPS
100.0000 mg | ORAL_CAPSULE | Freq: Two times a day (BID) | ORAL | Status: DC
  Administered 2021-01-13 – 2021-01-14 (×2): 100 mg via ORAL
  Filled 2021-01-13 (×3): qty 1

## 2021-01-13 NOTE — Progress Notes (Signed)
Subjective: Patient reports feeling much better this morning. She continues with IV fluids and daily IV prenatal viatmin bag. No vomiting in many hours now. She has taken in clear liquids without nausea. Resting quietly   Objective: I have reviewed patient's vital signs, intake and output, medications, and labs. BP (!) 91/56 (BP Location: Left Arm)   Pulse 75   Temp 98.3 F (36.8 C) (Oral)   Resp 20   Ht 5\' 3"  (1.6 m)   Wt 57.6 kg   LMP 10/15/2020 (Exact Date)   SpO2 100%   BMI 22.50 kg/m  Results for orders placed or performed during the hospital encounter of 01/11/21 (from the past 24 hour(s))  Basic metabolic panel     Status: Abnormal   Collection Time: 01/13/21  5:41 AM  Result Value Ref Range   Sodium 136 135 - 145 mmol/L   Potassium 3.0 (L) 3.5 - 5.1 mmol/L   Chloride 103 98 - 111 mmol/L   CO2 29 22 - 32 mmol/L   Glucose, Bld 111 (H) 70 - 99 mg/dL   BUN <5 (L) 6 - 20 mg/dL   Creatinine, Ser 03/15/21 (L) 0.44 - 1.00 mg/dL   Calcium 7.9 (L) 8.9 - 10.3 mg/dL   GFR, Estimated NOT CALCULATED >60 mL/min   Anion gap 4 (L) 5 - 15  Magnesium     Status: None   Collection Time: 01/13/21  5:41 AM  Result Value Ref Range   Magnesium 1.8 1.7 - 2.4 mg/dL     General: alert, cooperative, fatigued, and no distress Resp: clear to auscultation bilaterally Cardio: regular rate and rhythm, S1, S2 normal, no murmur, click, rub or gallop GI: soft, non-tender; bowel sounds normal; no masses,  no organomegaly and FHTS 150s , aucultated with hand held dippler. Extremities: extremities normal, atraumatic, no cyanosis or edema and no edema, redness or tenderness in the calves or thighs   Assessment/Plan: IUP 12+ weeks gestation  Hyperemesis gravidarum Responding well to IV rehydration, steroids, "Banana bag"  Will advance to solids today. Rechck her CMP in the am.  LOS: 1 day    03/15/21 01/13/2021, 11:00 AM

## 2021-01-14 DIAGNOSIS — R748 Abnormal levels of other serum enzymes: Secondary | ICD-10-CM

## 2021-01-14 DIAGNOSIS — R195 Other fecal abnormalities: Secondary | ICD-10-CM

## 2021-01-14 DIAGNOSIS — Z3A13 13 weeks gestation of pregnancy: Secondary | ICD-10-CM

## 2021-01-14 DIAGNOSIS — E876 Hypokalemia: Secondary | ICD-10-CM

## 2021-01-14 DIAGNOSIS — R109 Unspecified abdominal pain: Secondary | ICD-10-CM | POA: Diagnosis not present

## 2021-01-14 DIAGNOSIS — O210 Mild hyperemesis gravidarum: Secondary | ICD-10-CM | POA: Diagnosis not present

## 2021-01-14 DIAGNOSIS — Z8719 Personal history of other diseases of the digestive system: Secondary | ICD-10-CM

## 2021-01-14 LAB — COMPREHENSIVE METABOLIC PANEL
ALT: 193 U/L — ABNORMAL HIGH (ref 0–44)
AST: 76 U/L — ABNORMAL HIGH (ref 15–41)
Albumin: 3.2 g/dL — ABNORMAL LOW (ref 3.5–5.0)
Alkaline Phosphatase: 78 U/L (ref 38–126)
Anion gap: 7 (ref 5–15)
BUN: <5 mg/dL — ABNORMAL LOW (ref 6–20)
CO2: 28 mmol/L (ref 22–32)
Calcium: 8.5 mg/dL — ABNORMAL LOW (ref 8.9–10.3)
Chloride: 101 mmol/L (ref 98–111)
Creatinine, Ser: 0.43 mg/dL — ABNORMAL LOW (ref 0.44–1.00)
GFR, Estimated: >60 mL/min (ref >60)
Glucose, Bld: 112 mg/dL — ABNORMAL HIGH (ref 70–99)
Potassium: 3.2 mmol/L — ABNORMAL LOW (ref 3.5–5.1)
Sodium: 136 mmol/L (ref 135–145)
Total Bilirubin: 0.8 mg/dL (ref 0.3–1.2)
Total Protein: 6.1 g/dL — ABNORMAL LOW (ref 6.5–8.1)

## 2021-01-14 LAB — GASTROINTESTINAL PANEL BY PCR, STOOL (REPLACES STOOL CULTURE)

## 2021-01-14 MED ORDER — POTASSIUM CHLORIDE 10 MEQ/100ML IV SOLN
10.0000 meq | INTRAVENOUS | Status: AC
Start: 2021-01-14 — End: 2021-01-15
  Administered 2021-01-14 – 2021-01-15 (×4): 10 meq via INTRAVENOUS
  Filled 2021-01-14 (×4): qty 100

## 2021-01-14 MED ORDER — PROSOURCE PLUS PO LIQD: 30.0000 mL | Freq: Two times a day (BID) | ORAL | Status: DC

## 2021-01-14 MED ORDER — BOOST / RESOURCE BREEZE PO LIQD CUSTOM: 1.0000 | Freq: Three times a day (TID) | ORAL | Status: DC

## 2021-01-14 MED ORDER — LOPERAMIDE HCL 2 MG PO CAPS
2.0000 mg | ORAL_CAPSULE | ORAL | Status: DC | PRN
  Filled 2021-01-14: qty 1

## 2021-01-14 NOTE — Progress Notes (Signed)
Initial Nutrition Assessment RD working remotely.  DOCUMENTATION CODES:   Not applicable  INTERVENTION:  - will order Boost Breeze BID, each supplement provides 250 kcal and 9 grams of protein. - will order 30 ml Prosource Plus BID, each supplement provides 100 kcal and 15 grams protein.  - complete NFPE when feasible.  - consider adding probiotic.  - if patient unable to tolerate adequate PO nutrition, recommend post-pyloric small bore NGT and initiation of TF.    NUTRITION DIAGNOSIS:   Inadequate oral intake related to acute illness, nausea, vomiting as evidenced by per patient/family report.  GOAL:   Patient will meet greater than or equal to 90% of their needs  MONITOR:   PO intake, Supplement acceptance, Labs, Weight trends, I & O's  REASON FOR ASSESSMENT:   Malnutrition Screening Tool  ASSESSMENT:   20 year old female with no past medical history. She is G1P0; [redacted]w[redacted]d by 07/22/2021. She presented to the ED due to symptoms of hyperemesis. On presentation she was noted to be hyperkalemic and tachycardic. She reported 20 lb weight loss since the beginning of pregnancy.  Diet advanced to CLD on 9/11 at 0100 and to Soft yesterday at 1354. Despite diet advancement, she has still been mainly opting to consume jello and broth at meals.   She outlines that she often experiences frequent BMs and urgency with BMs at baseline, often having 5 BMs/day. She has previously taken over the counter digestive support vitamins but is unsure what they are called. She has had multiple loose BMs in the past 24 hours.   She reports nausea has greatly improved and she has not had any episodes of emesis the past 2 days.   GI is consulted and note from today reviewed.  Weight today is 128 lb, weight on 9/10 was 123 lb, and weight on 8/10 was 141 lb. This indicates 13 lb weight loss (9% body weight) in the past 1 month. Suspect some degree of malnutrition but unable to confirm without completing  NFPE.    Labs reviewed; K: 3.2 mmol/l, BUN: <5 mg/dl, creatinine: 1.61 mg/dl, Ca: 8.5 mg/dl, LFTs elevated but down from 9/12. Medications reviewed; 100 mg colace BID, 20 mg oral pepcid BID, medrol taper, PRN oral and IV zofran and phenergan, 25 mg vitamin B6 BID. IVF; D5-1/2 NS-20 mEq IV KCl @ 125 ml/hr (510 kcal/24 hrs).     NUTRITION - FOCUSED PHYSICAL EXAM:  Unable to complete.  Diet Order:   Diet Order             DIET SOFT Room service appropriate? Yes; Fluid consistency: Thin  Diet effective now                   EDUCATION NEEDS:   Not appropriate for education at this time  Skin:  Skin Assessment: Reviewed RN Assessment  Last BM:  PTA/unknown  Height:   Ht Readings from Last 1 Encounters:  01/11/21 5\' 3"  (1.6 m) (31 %, Z= -0.51)*   * Growth percentiles are based on CDC (Girls, 2-20 Years) data.    Weight:   Wt Readings from Last 1 Encounters:  01/14/21 58.1 kg (51 %, Z= 0.03)*   * Growth percentiles are based on CDC (Girls, 2-20 Years) data.     Estimated Nutritional Needs:  Kcal:  1800-2000 kcal Protein:  90-105 grams Fluid:  >/= 2.5 L/day      01/16/21, MS, RD, LDN, CNSC Inpatient Clinical Dietitian RD pager # available in [facility]  After hours/weekend pager # available in [facility]

## 2021-01-14 NOTE — Progress Notes (Signed)
Subjective:  She reports that she has been having less nausea and vomiting.  However since she is able to eat juice and Jell-O yesterday she has had issues with needing to have multiple bowel movements.  She reports that even prior to pregnancy she was having issues with urgency and defecation.  She has been wondering if she has IBS.  She reports that she generally was using the bathroom and having bowel movements more than 5 times a day.  She has never previously seen a GI doctor.  She reports that this has been a long-term issue for her that has worsens since she graduated high school.  She denies any blood or mucus in her stools.  She reports that her stools are generally brown or yellow in color.  She reports that the bowel movements are generally loose more than solid.  When she does have a small solid bowel movement is generally very small in amount.  In the past she was taking a digestive vitamin over-the-counter and a gummy form which helped her.  She does not remember the name of the digestive benefit vitamin.  She reports that prior to pregnancy she was eating more fatty fast food but now that food does not appetizer.  She reports that yesterday for breakfast she ate chicken broth and Jell-O.  For lunch she ate beef broth and Jell-O.  She is not able to eat dinner because her her stomach started hurting again.  She has not had any nausea or vomiting since she was originally admitted on the 11th.  She has had elevated liver enzymes this pregnancy and her prior abdominal ultrasound had shown sludge in her gallbladder as well as hepatic steatosis.  10.9 kg weight loss this pregnancy.  Objective:   Blood pressure (!) 99/59, pulse 81, temperature 98.5 F (36.9 C), temperature source Oral, resp. rate 18, height 5\' 3"  (1.6 m), weight 58.1 kg, last menstrual period 10/15/2020, SpO2 98 %.  General: NAD Pulmonary: no increased work of breathing Abdomen: non-distended, diffusely tender, no rebound, no  guarding Uterus:  gravid Extremities: no edema, no erythema, no tenderness, no signs of DVT  Results for orders placed or performed during the hospital encounter of 01/11/21 (from the past 72 hour(s))  Lipase, blood     Status: None   Collection Time: 01/11/21  3:57 PM  Result Value Ref Range   Lipase 36 11 - 51 U/L    Comment: Performed at [facility], [address] [place], [place]  Comprehensive metabolic panel     Status: Abnormal   Collection Time: 01/11/21  3:57 PM  Result Value Ref Range   Sodium 130 (L) 135 - 145 mmol/L   Potassium 2.2 (LL) 3.5 - 5.1 mmol/L    Comment: CRITICAL RESULT CALLED TO, READ BACK BY AND VERIFIED WITH CRYSTAL GUALDONI RN @1636  01/11/21 SCS    Chloride 90 (L) 98 - 111 mmol/L   CO2 26 22 - 32 mmol/L   Glucose, Bld 119 (H) 70 - 99 mg/dL    Comment: Glucose reference range applies only to samples taken after fasting for at least 8 hours.   BUN <5 (L) 6 - 20 mg/dL   Creatinine, Ser 0.44 - 1.00 mg/dL   Calcium 9.2 8.9 - 03/13/21 mg/dL   Total Protein 8.0 6.5 - 8.1 g/dL   Albumin 3.8 3.5 - 5.0 g/dL   AST 6.75 (H) 15 - 41 U/L   ALT 332 (H) 0 - 44 U/L  Alkaline Phosphatase 111 38 - 126 U/L   Total Bilirubin 1.7 (H) 0.3 - 1.2 mg/dL   GFR, Estimated >85 >27 mL/min    Comment: (NOTE) Calculated using the CKD-EPI Creatinine Equation (2021)    Anion gap 14 5 - 15    Comment: Performed at [facility], [address] [place], [place] [address]  CBC     Status: Abnormal   Collection Time: 01/11/21  3:57 PM  Result Value Ref Range   WBC 9.3 4.0 - 10.5 K/uL   RBC 4.83 3.87 - 5.11 MIL/uL   Hemoglobin 15.0 12.0 - 15.0 g/dL   HCT 35.3 61.4 - 43.1 %   MCV 82.8 80.0 - 100.0 fL   MCH 31.1 26.0 - 34.0 pg   MCHC 37.5 (H) 30.0 - 36.0 g/dL   RDW 54.0 08.6 - 76.1 %   Platelets 258 150 - 400 K/uL   nRBC 0.0 0.0 - 0.2 %    Comment: Performed at [facility], [address] [place], [place] [address]  hCG, quantitative,  pregnancy     Status: Abnormal   Collection Time: 01/11/21  3:57 PM  Result Value Ref Range   hCG, Beta Chain, Quant, S 143,643 (H) <5 mIU/mL    Comment:          GEST. AGE      CONC.  (mIU/mL)   <=1 WEEK        5 - 50     2 WEEKS       50 - 500     3 WEEKS       100 - 10,000     4 WEEKS     1,000 - 30,000     5 WEEKS     3,500 - 115,000   6-8 WEEKS     12,000 - 270,000    12 WEEKS     15,000 - 220,000        FEMALE AND NON-PREGNANT FEMALE:     LESS THAN 5 mIU/mL Performed at [facility], [address] [place], [place] [address]   Magnesium     Status: None   Collection Time: 01/11/21  3:57 PM  Result Value Ref Range   Magnesium 1.8 1.7 - 2.4 mg/dL    Comment: Performed at [facility], [address] [place], [place] [address]  Resp Panel by RT-PCR (Flu A&B, Covid) Nasopharyngeal Swab     Status: None   Collection Time: 01/11/21  4:12 PM   Specimen: Nasopharyngeal Swab; Nasopharyngeal(NP) swabs in vial transport medium  Result Value Ref Range   SARS Coronavirus 2 by RT PCR NEGATIVE NEGATIVE    Comment: (NOTE) SARS-CoV-2 target nucleic acids are NOT DETECTED.  The SARS-CoV-2 RNA is generally detectable in upper respiratory specimens during the acute phase of infection. The lowest concentration of SARS-CoV-2 viral copies this assay can detect is 138 copies/mL. A negative result does not preclude SARS-Cov-2 infection and should not be used as the sole basis for treatment or other patient management decisions. A negative result may occur with  improper specimen collection/handling, submission of specimen other than nasopharyngeal swab, presence of viral mutation(s) within the areas targeted by this assay, and inadequate number of viral copies(<138 copies/mL). A negative result must be combined with clinical observations, patient history, and epidemiological information. The expected result is Negative.  Fact Sheet for Patients:   BloggerCourse.com  Fact Sheet for Healthcare Providers:  SeriousBroker.it  This test is no t  yet approved or cleared by the [place] and  has been authorized for detection and/or diagnosis of SARS-CoV-2 by FDA under an Emergency Use Authorization (EUA). This EUA will remain  in effect (meaning this test can be used) for the duration of the COVID-19 declaration under Section 564(b)(1) of the Act, 21 U.S.C.section 360bbb-3(b)(1), unless the authorization is terminated  or revoked sooner.       Influenza A by PCR NEGATIVE NEGATIVE   Influenza B by PCR NEGATIVE NEGATIVE    Comment: (NOTE) The Xpert Xpress SARS-CoV-2/FLU/RSV plus assay is intended as an aid in the diagnosis of influenza from Nasopharyngeal swab specimens and should not be used as a sole basis for treatment. Nasal washings and aspirates are unacceptable for Xpert Xpress SARS-CoV-2/FLU/RSV testing.  Fact Sheet for Patients: BloggerCourse.com  Fact Sheet for Healthcare Providers: SeriousBroker.it  This test is not yet approved or cleared by the [place] FDA and has been authorized for detection and/or diagnosis of SARS-CoV-2 by FDA under an Emergency Use Authorization (EUA). This EUA will remain in effect (meaning this test can be used) for the duration of the COVID-19 declaration under Section 564(b)(1) of the Act, 21 U.S.C. section 360bbb-3(b)(1), unless the authorization is terminated or revoked.  Performed at [facility], [address] [place], [place] [address]   Urinalysis, Complete w Microscopic     Status: Abnormal   Collection Time: 01/11/21  4:21 PM  Result Value Ref Range   Color, Urine YELLOW YELLOW   APPearance CLOUDY (A) CLEAR   Specific Gravity, Urine 1.015 1.005 - 1.030   pH 7.0 5.0 - 8.0   Glucose, UA NEGATIVE NEGATIVE mg/dL   Hgb urine dipstick TRACE (A) NEGATIVE    Bilirubin Urine SMALL (A) NEGATIVE   Ketones, ur >160 (A) NEGATIVE mg/dL   Protein, ur 30 (A) NEGATIVE mg/dL    Comment: CORRECTED ON 09/11 AT 0309: PREVIOUSLY REPORTED AS 7.0   Nitrite NEGATIVE NEGATIVE   Leukocytes,Ua SMALL (A) NEGATIVE   RBC / HPF 6-10 0 - 5 RBC/hpf   WBC, UA 21-50 0 - 5 WBC/hpf   Bacteria, UA FEW (A) NONE SEEN   Squamous Epithelial / LPF 11-20 0 - 5   Mucus PRESENT     Comment: Performed at [facility], [address] [place], [place] [address]  Urine Culture     Status: None   Collection Time: 01/11/21  4:21 PM   Specimen: Urine, Random  Result Value Ref Range   Specimen Description      URINE, RANDOM Performed at [facility], [address], [place], [place] [address]    Special Requests      NONE Performed at [facility], [address] [place], [place] [address]    Culture      NO GROWTH Performed at [facility], [address], [place], [place] [address]    Report Status 01/13/2021 FINAL   Acetaminophen level     Status: Abnormal   Collection Time: 01/11/21  6:00 PM  Result Value Ref Range   Acetaminophen (Tylenol), Serum <10 (L) 10 - 30 ug/mL    Comment: (NOTE) Therapeutic concentrations vary significantly. A range of 10-30 ug/mL  may be an effective concentration for many patients. However, some  are best treated at concentrations outside of this range. Acetaminophen concentrations >150 ug/mL at 4 hours after ingestion  and >50 ug/mL at 12 hours after ingestion are often associated with  toxic reactions.  Performed at [facility], [address] [place], [place] [address]   Basic metabolic panel     Status: Abnormal   Collection Time: 01/12/21  5:05 AM  Result Value Ref Range   Sodium 132 (L) 135 - 145 mmol/L   Potassium 3.1 (L) 3.5 - 5.1 mmol/L   Chloride 99 98 - 111 mmol/L   CO2 28 22 - 32 mmol/L   Glucose, Bld 161 (H) 70 - 99 mg/dL    Comment: Glucose reference range applies only  to samples taken after fasting for at least 8 hours.   BUN <5 (L) 6 - 20 mg/dL   Creatinine, Ser 8.14 0.44 - 1.00 mg/dL   Calcium 8.1 (L) 8.9 - 10.3 mg/dL   GFR, Estimated >48 >18 mL/min    Comment: (NOTE) Calculated using the CKD-EPI Creatinine Equation (2021)    Anion gap 5 5 - 15    Comment: Performed at [facility], [address] [place], [place] [address]  Magnesium     Status: None   Collection Time: 01/12/21  5:05 AM  Result Value Ref Range   Magnesium 1.8 1.7 - 2.4 mg/dL    Comment: Performed at [facility], [address] [place], [place] [address]  Basic metabolic panel     Status: Abnormal   Collection Time: 01/13/21  5:41 AM  Result Value Ref Range   Sodium 136 135 - 145 mmol/L   Potassium 3.0 (L) 3.5 - 5.1 mmol/L   Chloride 103 98 - 111 mmol/L   CO2 29 22 - 32 mmol/L   Glucose, Bld 111 (H) 70 - 99 mg/dL    Comment: Glucose reference range applies only to samples taken after fasting for at least 8 hours.   BUN <5 (L) 6 - 20 mg/dL   Creatinine, Ser <3.78 (L) 0.44 - 1.00 mg/dL   Calcium 7.9 (L) 8.9 - 10.3 mg/dL   GFR, Estimated NOT CALCULATED >60 mL/min    Comment: (NOTE) Calculated using the CKD-EPI Creatinine Equation (2021)    Anion gap 4 (L) 5 - 15    Comment: Performed at [facility], [address], [place], [place] [address]  Magnesium     Status: None   Collection Time: 01/13/21  5:41 AM  Result Value Ref Range   Magnesium 1.8 1.7 - 2.4 mg/dL    Comment: Performed at [facility], [address] [place], [place] [address]  Comprehensive metabolic panel     Status: Abnormal   Collection Time: 01/14/21  6:18 AM  Result Value Ref Range   Sodium 136 135 - 145 mmol/L   Potassium 3.2 (L) 3.5 - 5.1 mmol/L   Chloride 101 98 - 111 mmol/L   CO2 28 22 - 32 mmol/L   Glucose, Bld 112 (H) 70 - 99 mg/dL    Comment: Glucose reference range applies only to samples taken after fasting for at least 8 hours.   BUN <5 (L) 6 -  20 mg/dL   Creatinine, Ser 2.87 (L) 0.44 - 1.00 mg/dL   Calcium 8.5 (L) 8.9 - 10.3 mg/dL   Total Protein 6.1 (L) 6.5 - 8.1 g/dL   Albumin 3.2 (L) 3.5 - 5.0 g/dL   AST 76 (H) 15 - 41 U/L   ALT 193 (H) 0 - 44 U/L   Alkaline Phosphatase 78 38 - 126 U/L   Total Bilirubin 0.8 0.3 - 1.2 mg/dL   GFR, Estimated >86 >76 mL/min    Comment: (  NOTE) Calculated using the CKD-EPI Creatinine Equation (2021)    Anion gap 7 5 - 15    Comment: Performed at [facility], [address] [place], [place] [address]    Assessment:   20 y.o. G1P0 hospital day #2  Plan:   1) Hyperemesis: Patient has been on started on a steroid taper for hyperemesis.  She is also receiving B6 and Unisom.  She has orders for Zofran Phenergan and Vistaril.  She is on IV fluids with vitamin replacement. She is receiving pepcid.  2) GI concerns and elevated liver enzymes. History of hepatic steatosis. Will consult GI for assistance in evaluation and treatment options.   3) Advance diet as tolerated  4) Continue care inpatient today.   Adelene Idler MD [facility], [facility] 01/14/2021 10:55 AM

## 2021-01-14 NOTE — Consult Note (Signed)
Melodie Bouillon, MD [address], [address], [place], [place], [address] [address], [place], [place], [address] Phone: [phone]  Fax: [phone]  Consultation  Referring Provider:     Dr. Jerene Pitch Primary Care Physician:  Velvet Bathe, MD Reason for Consultation:     Diarrhea, stomach pain, hyperemesis, elevated liver enzymes, fatty liver on ultrasound  Date of Admission:  01/11/2021 Date of Consultation:  01/14/2021         HPI:   Janeka Libman is a 20 y.o. female G1, P0, 12+ weeks gestation, admitted with nausea vomiting, diagnosed with hyperemesis gravidarum with GI consulted for the above.  Patient reports onset of nausea vomiting symptoms since her pregnancy and did not have nausea vomiting prior to this.  Reports decrease in appetite and poor p.o. intake due to this.  However, prior to pregnancy she describes having intermittent episodes of abdominal pain, and loose stools, but this did not affect her appetite and she used to eat well.  She describes having loose bowel movements with urgency, about once a week, with bilateral lower quadrant abdominal pain that typically would start after 2 to 3 days of not having a bowel movement.  Dull, 5 or 10, nonradiating.  It would improve somewhat after having a bowel movement but she would have multiple loose stools for 2 to 3 days.  She never sought medical care for the symptoms.  She reports that the symptoms seems to have worsened recently.  As of yesterday has had recurrence of her loose stools that she remembers having prior to pregnancy.  No blood in stool.  No melena.  Denies any hematemesis or coffee-ground emesis.  PMHx: Pt denies any medical problems. Denies ever being on medications and does not see a PCP  Past surgical History: Denies any surgeries recently or even as a child  Prior to Admission medications   Medication Sig Start Date End Date Taking? Authorizing Provider  Doxylamine-Pyridoxine (DICLEGIS)  10-10 MG TBEC Initial, 2 tablets (doxylamine succinate 10 mg/pyridoxine hydrochloride 10 mg per tablet) orally at bedtime on day 1 and 2; if symptoms persist, take 1 tablet in morning and 2 tablets at bedtime on day 3; if symptoms persist, may increase to MAX 4 tablets per day, administered as 1 tablet in the morning, 1 tablet in mid-afternoon and 2 tablets at bedtime Patient not taking: Reported on 01/11/2021 01/02/21   Trudee Grip A, PA-C  ondansetron (ZOFRAN ODT) 4 MG disintegrating tablet Take 1 tablet (4 mg total) by mouth every 6 (six) hours as needed for nausea. Patient not taking: Reported on 01/11/2021 12/20/20   Nadara Mustard, MD  ondansetron (ZOFRAN ODT) 4 MG disintegrating tablet Take 1 tablet (4 mg total) by mouth every 6 (six) hours as needed for nausea. 01/08/21   Schuman, Jaquelyn Bitter, MD  promethazine (PHENERGAN) 25 MG tablet Take 1 tablet (25 mg total) by mouth every 6 (six) hours as needed for nausea or vomiting. 01/08/21   Natale Milch, MD    Family History  Problem Relation Age of Onset   Colon cancer Father    Throat cancer Father      Social History   Tobacco Use   Smoking status: Never    Passive exposure: Never   Smokeless tobacco: Never  Vaping Use   Vaping Use: Never used  Substance Use Topics   Alcohol use: Yes   Drug use: Never    Allergies as of 01/11/2021   (No Known Allergies)  Review of Systems:    All systems reviewed and negative except where noted in HPI.   Physical Exam:  Constitutional: General:   Alert,  Well-developed, well-nourished, pleasant and cooperative in NAD BP (!) 99/59 (BP Location: Left Arm)   Pulse 81   Temp 98.5 F (36.9 C) (Oral)   Resp 18   Ht $R'5\' 3"'XH$  (1.6 m)   Wt 58.1 kg   LMP 10/15/2020 (Exact Date)   SpO2 98%   BMI 22.67 kg/m   Eyes:  Sclera clear, no icterus.   Conjunctiva pink. PERRLA  Ears:  No scars, lesions or masses, Normal auditory acuity. Nose:  No deformity, discharge, or lesions. Mouth:  No  deformity or lesions, oropharynx pink & moist.  Neck:  Supple; no masses or thyromegaly.  Respiratory: Normal respiratory effort, Normal percussion  Gastrointestinal:  Normal bowel sounds.  No bruits.  Soft, non-tender and non-distended without masses, hepatosplenomegaly or hernias noted.  No guarding or rebound tenderness.     Cardiac: No clubbing or edema.  No cyanosis. Normal posterior tibial pedal pulses noted.  Lymphatic:  No significant cervical or axillary adenopathy.  Psych:  Alert and cooperative. Normal mood and affect.  Musculoskeletal:  Normal gait. Head normocephalic, atraumatic. Symmetrical without gross deformities. 5/5 Upper and Lower extremity strength bilaterally.  Skin: Warm. Intact without significant lesions or rashes. No jaundice.  Neurologic:  Face symmetrical, tongue midline, Normal sensation to touch;  grossly normal neurologically.  Psych:  Alert and oriented x3, Alert and cooperative. Normal mood and affect.   LAB RESULTS: Recent Labs    01/11/21 1557  WBC 9.3  HGB 15.0  HCT 40.0  PLT 258   BMET Recent Labs    01/12/21 0505 01/13/21 0541 01/14/21 0618  NA 132* 136 136  K 3.1* 3.0* 3.2*  CL 99 103 101  CO2 $Re'28 29 28  'lMK$ GLUCOSE 161* 111* 112*  BUN <5* <5* <5*  CREATININE 0.44 <0.30* 0.43*  CALCIUM 8.1* 7.9* 8.5*   LFT Recent Labs    01/14/21 0618  PROT 6.1*  ALBUMIN 3.2*  AST 76*  ALT 193*  ALKPHOS 78  BILITOT 0.8   PT/INR No results for input(s): LABPROT, INR in the last 72 hours.  STUDIES: Right upper quadrant ultrasound 01/02/2021  Mild hepatic steatosis, layering sludge in the gallbladder, common bile duct diameter 4 mm  EKG: Sinus tachycardia 9/2   Impression / Plan:   Merry Pond is a 20 y.o. y/o female with 12+ week gestation with hyperemesis gravidarum, with elevated liver enzymes, and intermittent loose stools prior to pregnancy and recent worsening of symptoms  Elevated liver enzymes: Patient is elevated liver  enzymes are likely due to her nausea vomiting in pregnancy/hyperemesis gravidarum  Transaminases are still elevated, but have improved compared to yesterday  Bilirubin and alk phos have normalized.  No evidence of biliary obstruction on ultrasound  As per uptodate: "The degree of abnormality in liver chemistries correlates with the severity of vomiting; the highest elevations are seen in patients with the most severe or protracted vomiting. Abnormal liver chemistries resolve promptly upon resolution of vomiting."  Hep C antibody, and acute hepatitis panel is negative which is reassuring  We do not have baseline labs prior to her pregnancy to compare  If her liver enzymes continue to improve over the next 1 to 2 days as it is today, continue expectant management, and avoiding hepatotoxic drugs.  Continue treatment of her nausea vomiting of pregnancy/hyperemesis gravidarum as elevated liver enzymes are  likely to improve/resolve with this.  However, if they do not improve as expected, we can order further work-up to evaluate for other causes such as autoimmune hepatitis/Wilson's disease as necessary  Abdominal pain/loose stools:  Given that her loose stools have reoccurred as of yesterday, would recommend checking for infectious etiology  However, given her intermittent symptoms prior to pregnancy, will also check fecal calprotectin to evaluate for any suggestion of IBD  Although, IBD is less likely as patient reports having abdominal pain after having 2 to 3 days of constipation before she will have loose stools.  IBD would not be expected to lead to constipation.  She may have overflow diarrhea causing her intermittent symptoms prior to pregnancy, or possible functional symptoms as well.  Recommend discontinuing the docusate the patient is on.  Use Imodium as needed until infectious panel rules out infectious etiology.  If C. difficile positive, stop Imodium.  Can consider Metamucil as bulking  agent as needed as well until further work-up is available  If symptoms do not improve, can consider MRI abdomen since patient is pregnant and would avoid radiation used for CT scans.  Nausea vomiting: Her weight loss is likely related to her nausea vomiting and low appetite and poor p.o. intake that all started at the onset of her pregnancy and was not present prior to that.  Continue treatment for hyperemesis gravidarum, with steroids as per primary team  Hypokalemia present on presentation, is improving.  Patient is receiving potassium via IV fluids which is appropriate.  Would recommend continued IV replacement over oral replacement as oral replacement can contribute to her loose stools  Hyponatremia improved with IV fluids as well  Sinus tachycardia noted on EKG on 01/03/2021, has improved with pulse rate of 81 documented on flowsheets today.  Continue hydration as necessary  Continue antiemetics as needed  Would recommend small frequent meals    We will continue to follow  Discussed with Dr. Gilman Schmidt  Thank you for involving me in the care of this patient.      LOS: 2 days   Virgel Manifold, MD  01/14/2021, 3:27 PM

## 2021-01-15 ENCOUNTER — Other Ambulatory Visit: Payer: Self-pay | Admitting: Advanced Practice Midwife

## 2021-01-15 DIAGNOSIS — R748 Abnormal levels of other serum enzymes: Secondary | ICD-10-CM | POA: Diagnosis not present

## 2021-01-15 DIAGNOSIS — R824 Acetonuria: Secondary | ICD-10-CM

## 2021-01-15 DIAGNOSIS — E876 Hypokalemia: Secondary | ICD-10-CM

## 2021-01-15 DIAGNOSIS — O210 Mild hyperemesis gravidarum: Secondary | ICD-10-CM | POA: Diagnosis not present

## 2021-01-15 DIAGNOSIS — R7401 Elevation of levels of liver transaminase levels: Secondary | ICD-10-CM

## 2021-01-15 DIAGNOSIS — E860 Dehydration: Secondary | ICD-10-CM

## 2021-01-15 DIAGNOSIS — R195 Other fecal abnormalities: Secondary | ICD-10-CM | POA: Diagnosis not present

## 2021-01-15 DIAGNOSIS — O99891 Other specified diseases and conditions complicating pregnancy: Secondary | ICD-10-CM | POA: Insufficient documentation

## 2021-01-15 HISTORY — DX: Elevation of levels of liver transaminase levels: R74.01

## 2021-01-15 HISTORY — DX: Hypokalemia: E87.6

## 2021-01-15 LAB — MATERNIT21 PLUS CORE+SCA
Fetal Fraction: 6
Monosomy X (Turner Syndrome): NOT DETECTED
Result (T21): NEGATIVE
Trisomy 13 (Patau syndrome): NEGATIVE
Trisomy 18 (Edwards syndrome): NEGATIVE
Trisomy 21 (Down syndrome): NEGATIVE
XXX (Triple X Syndrome): NOT DETECTED
XXY (Klinefelter Syndrome): NOT DETECTED
XYY (Jacobs Syndrome): NOT DETECTED

## 2021-01-15 LAB — COMPREHENSIVE METABOLIC PANEL
ALT: 132 U/L — ABNORMAL HIGH (ref 0–44)
AST: 42 U/L — ABNORMAL HIGH (ref 15–41)
Albumin: 2.7 g/dL — ABNORMAL LOW (ref 3.5–5.0)
Alkaline Phosphatase: 66 U/L (ref 38–126)
Anion gap: 7 (ref 5–15)
BUN: <5 mg/dL — ABNORMAL LOW (ref 6–20)
CO2: 26 mmol/L (ref 22–32)
Calcium: 8.4 mg/dL — ABNORMAL LOW (ref 8.9–10.3)
Chloride: 104 mmol/L (ref 98–111)
Creatinine, Ser: 0.31 mg/dL — ABNORMAL LOW (ref 0.44–1.00)
GFR, Estimated: >60 mL/min (ref >60)
Glucose, Bld: 113 mg/dL — ABNORMAL HIGH (ref 70–99)
Potassium: 3.6 mmol/L (ref 3.5–5.1)
Sodium: 137 mmol/L (ref 135–145)
Total Bilirubin: 0.7 mg/dL (ref 0.3–1.2)
Total Protein: 5.7 g/dL — ABNORMAL LOW (ref 6.5–8.1)

## 2021-01-15 LAB — GLUCOSE, RANDOM: Glucose, Bld: 103 mg/dL — ABNORMAL HIGH (ref 70–99)

## 2021-01-15 MED ORDER — METHYLPREDNISOLONE 4 MG PO TABS: 4.0000 mg | ORAL_TABLET | Freq: Every day | ORAL | 0 refills | Status: DC

## 2021-01-15 MED ORDER — METHYLPREDNISOLONE 8 MG PO TABS: 8.0000 mg | ORAL_TABLET | Freq: Every day | ORAL | 0 refills | Status: DC

## 2021-01-15 MED ORDER — DOXYLAMINE SUCCINATE (SLEEP) 25 MG PO TABS: 25.0000 mg | ORAL_TABLET | Freq: Two times a day (BID) | ORAL | 0 refills | Status: DC

## 2021-01-15 MED ORDER — PYRIDOXINE HCL 25 MG PO TABS
25.0000 mg | ORAL_TABLET | Freq: Two times a day (BID) | ORAL | 2 refills | Status: DC
Start: 2021-01-15 — End: 2021-05-09

## 2021-01-15 NOTE — Progress Notes (Signed)
Pt discharged home. Discharge instructions, prescriptions, and follow up appointments given to and reviewed with pt. Pt verbalized understanding. To be escorted by axillary.  

## 2021-01-15 NOTE — Telephone Encounter (Signed)
Spoke with pharmacist regarding correct Rx.

## 2021-01-15 NOTE — Discharge Summary (Signed)
Physician Final Progress Note  Patient ID: Robin Campbell MRN: [id number] DOB/AGE: 01-06-2001 20 y.o.  Admit date: 01/11/2021 Admitting provider: Vena Austria, MD Discharge date: 01/15/2021   Admission Diagnoses:  1) intrauterine pregnancy at [redacted]w[redacted]d  2) Hyperemesis, dehydration, hypokalemia, transaminitis, diarrhea  Discharge Diagnoses:  Principal Problem:   Encounter for supervision of normal first pregnancy in first trimester Active Problems:   Hyperemesis affecting pregnancy, antepartum   Hyperemesis gravidarum   Dehydration   Hypokalemia   Ketonuria   Transaminitis    History of Present Illness: The patient is a 20 y.o. female G1P0 at [redacted]w[redacted]d who presents for symptoms of hyperemesis.  Patient noted to be tachycardic and hyperkalemic on presentation.  Symptoms were not controlled on home regimen of Dicelgis, Zofran, and promethazine.  Symptoms have been progressively worsening in the past week with currently no po tolerance.  She declined admission on 9/7.  She has lost 20lbs since start of her pregnancy.  No vaginal bleeding or other pregnancy concerns.  She was admitted for IV fluids/medication treatment, labs collected, diet advanced as tolerated. By the time of discharge she is tolerating PO diet and medications. She reports some improvement in diarrhea and has declined Imodium. Her labs have improved. She is discharged to home with instructions and precautions.   History reviewed. No pertinent past medical history.  History reviewed. No pertinent surgical history.  No current facility-administered medications on file prior to encounter.   Current Outpatient Medications on File Prior to Encounter  Medication Sig Dispense Refill   Doxylamine-Pyridoxine (DICLEGIS) 10-10 MG TBEC Initial, 2 tablets (doxylamine succinate 10 mg/pyridoxine hydrochloride 10 mg per tablet) orally at bedtime on day 1 and 2; if symptoms persist, take 1 tablet in morning and 2 tablets at bedtime on  day 3; if symptoms persist, may increase to MAX 4 tablets per day, administered as 1 tablet in the morning, 1 tablet in mid-afternoon and 2 tablets at bedtime (Patient not taking: Reported on 01/11/2021) 60 tablet 1   ondansetron (ZOFRAN ODT) 4 MG disintegrating tablet Take 1 tablet (4 mg total) by mouth every 6 (six) hours as needed for nausea. (Patient not taking: Reported on 01/11/2021) 20 tablet 0   ondansetron (ZOFRAN ODT) 4 MG disintegrating tablet Take 1 tablet (4 mg total) by mouth every 6 (six) hours as needed for nausea. 120 tablet 3   promethazine (PHENERGAN) 25 MG tablet Take 1 tablet (25 mg total) by mouth every 6 (six) hours as needed for nausea or vomiting. 120 tablet 3    No Known Allergies  Social History   Socioeconomic History   Marital status: Single    Spouse name: Not on file   Number of children: Not on file   Years of education: Not on file   Highest education level: Not on file  Occupational History   Occupation: Retail  Tobacco Use   Smoking status: Never    Passive exposure: Never   Smokeless tobacco: Never  Vaping Use   Vaping Use: Never used  Substance and Sexual Activity   Alcohol use: Yes   Drug use: Never   Sexual activity: Yes    Partners: Male    Birth control/protection: None  Other Topics Concern   Not on file  Social History Narrative   Not on file   Social Determinants of Health   Financial Resource Strain: Not on file  Food Insecurity: Not on file  Transportation Needs: Not on file  Physical Activity: Not on file  Stress: Not  on file  Social Connections: Not on file  Intimate Partner Violence: Not on file    Family History  Problem Relation Age of Onset   Colon cancer Father    Throat cancer Father      Review of Systems  Constitutional:  Negative for chills and fever.  HENT:  Negative for congestion, ear discharge, ear pain, hearing loss, sinus pain and sore throat.   Eyes:  Negative for blurred vision and double vision.   Respiratory:  Negative for cough, shortness of breath and wheezing.   Cardiovascular:  Negative for chest pain, palpitations and leg swelling.  Gastrointestinal:  Positive for diarrhea. Negative for abdominal pain, blood in stool, constipation, heartburn, melena, nausea and vomiting.  Genitourinary:  Negative for dysuria, flank pain, frequency, hematuria and urgency.  Musculoskeletal:  Negative for back pain, joint pain and myalgias.  Skin:  Negative for itching and rash.  Neurological:  Negative for dizziness, tingling, tremors, sensory change, speech change, focal weakness, seizures, loss of consciousness, weakness and headaches.  Endo/Heme/Allergies:  Negative for environmental allergies. Does not bruise/bleed easily.  Psychiatric/Behavioral:  Negative for depression, hallucinations, memory loss, substance abuse and suicidal ideas. The patient is not nervous/anxious and does not have insomnia.     Physical Exam: BP 106/64 (BP Location: Left Arm)   Pulse 92   Temp 98.1 F (36.7 C) (Oral)   Resp 18   Ht 5\' 3"  (1.6 m)   Wt 57.4 kg   LMP 10/15/2020 (Exact Date)   SpO2 100%   BMI 22.43 kg/m   Constitutional: Well nourished, well developed female in no acute distress.  HEENT: normal Skin: Warm and dry. Cardiovascular: Regular rate and rhythm.   Extremity:  no edema   Respiratory: Clear to auscultation bilateral. Normal respiratory effort Neuro: DTRs 2+, Cranial nerves grossly intact Psych: Alert and Oriented x3. No memory deficits. Normal mood and affect.   Fetal well being: positive fetal heart tones by doppler  Consults: GI and dietary  Significant Findings/ Diagnostic Studies: labs:   Results for Robin, Campbell (MRN Robin Campbell) as of 01/15/2021 12:05  Ref. Range 01/13/2021 05:41 01/14/2021 06:18 01/14/2021 16:35 01/15/2021 04:20 01/15/2021 07:45  BASIC METABOLIC PANEL Unknown Rpt (A)      COMPREHENSIVE METABOLIC PANEL Unknown  Rpt (A)   Rpt (A)  Sodium Latest Ref Range: 135 -  145 mmol/L 136 136   137  Potassium Latest Ref Range: 3.5 - 5.1 mmol/L 3.0 (L) 3.2 (L)   3.6  Chloride Latest Ref Range: 98 - 111 mmol/L 103 101   104  CO2 Latest Ref Range: 22 - 32 mmol/L 29 28   26   Glucose Latest Ref Range: 70 - 99 mg/dL 01/17/2021 (H) (H)  761 (H) 113 (H)  BUN Latest Ref Range: 6 - 20 mg/dL <5 (L) <5 (L)   <5 (L)  Creatinine Latest Ref Range: 0.44 - 1.00 mg/dL 607 (L) 371 (L)   <0.62 (L)  Calcium Latest Ref Range: 8.9 - 10.3 mg/dL 7.9 (L) 8.5 (L)   8.4 (L)  Anion gap Latest Ref Range: 5 - 15  4 (L) 7   7  Magnesium Latest Ref Range: 1.7 - 2.4 mg/dL 1.8      Alkaline Phosphatase Latest Ref Range: 38 - 126 U/L  78   66  Albumin Latest Ref Range: 3.5 - 5.0 g/dL  3.2 (L)   2.7 (L)  AST Latest Ref Range: 15 - 41 U/L  76 (H)   42 (H)  ALT Latest Ref Range: 0 - 44 U/L  193 (H)   132 (H)  Total Protein Latest Ref Range: 6.5 - 8.1 g/dL  6.1 (L)   5.7 (L)  Total Bilirubin Latest Ref Range: 0.3 - 1.2 mg/dL  0.8   0.7  GFR, Estimated Latest Ref Range: >60 mL/min NOT CALCULATED >60   >60  GASTROINTESTINAL PANEL BY PCR, STOOL (REPLACES STOOL CULTURE) Unknown   Rpt      Procedures: none  Hospital Course: The patient was admitted to Labor and Delivery Triage for observation.   Discharge Condition: good  Disposition: Discharge disposition: 01-Home or Self Care  Diet: advance diet as tolerated, small frequent meals  Discharge Activity: Activity as tolerated  Discharge Instructions     Discharge activity:  No Restrictions   Complete by: As directed    Discharge diet:  No restrictions   Complete by: As directed    Diet as tolerated, small frequent PO intake   No sexual activity restrictions   Complete by: As directed       Allergies as of 01/15/2021   No Known Allergies      Medication List     STOP taking these medications    Doxylamine-Pyridoxine 10-10 MG Tbec Commonly known as: Diclegis   ondansetron 4 MG disintegrating tablet Commonly known as: Zofran  ODT       TAKE these medications    doxylamine (Sleep) 25 MG tablet Commonly known as: UNISOM Take 1 tablet (25 mg total) by mouth 2 (two) times daily.   methylPREDNISolone 8 MG tablet Commonly known as: MEDROL Take 1 tablet (8 mg total) by mouth daily at 2 PM.   methylPREDNISolone 8 MG tablet Commonly known as: MEDROL Take 1 tablet (8 mg total) by mouth at bedtime.   methylPREDNISolone 8 MG tablet Commonly known as: MEDROL Take 1 tablet (8 mg total) by mouth daily with breakfast. Start taking on: January 16, 2021   methylPREDNISolone 4 MG tablet Commonly known as: MEDROL Take 1 tablet (4 mg total) by mouth daily at 2 PM. Start taking on: January 17, 2021   methylPREDNISolone 4 MG tablet Commonly known as: MEDROL Take 1 tablet (4 mg total) by mouth at bedtime. Start taking on: January 18, 2021   methylPREDNISolone 4 MG tablet Commonly known as: MEDROL Take 1 tablet (4 mg total) by mouth daily with breakfast. Start taking on: January 23, 2021   promethazine 25 MG tablet Commonly known as: PHENERGAN Take 1 tablet (25 mg total) by mouth every 6 (six) hours as needed for nausea or vomiting.   pyridOXINE 25 MG tablet Commonly known as: VITAMIN B-6 Take 1 tablet (25 mg total) by mouth 2 (two) times daily.        Follow-up Information     [facility]. Go to.   Specialty: Obstetrics and Gynecology Why: scheduled appointments and PRN Contact information: [address] [place] [address] [phone]                Total time spent taking care of this patient: 25 minutes  Signed: Tresea Mall, CNM  01/15/2021, 12:08 PM

## 2021-01-15 NOTE — Progress Notes (Signed)
Robin Bouillon, MD [address], [address], [place], [place], [address] [address], [place], [place], [address] Phone: [phone]  Fax: [phone]   Subjective: Patient reports her diarrhea had improved last night but has reoccurred today.  Reports 3 loose bowel movements today.  Continues to have Bilateral lower quadrant abdominal pain.  No blood in stool.  No fever or chills.  Reports nausea vomiting has improved as well.  Reports improvement in appetite  Imodium was prescribed, but patient states she refused to take it as she did not want to have constipation as she has struggled with constipation before   Objective: Exam: Vital signs in last 24 hours: Vitals:   01/14/21 2312 01/15/21 0300 01/15/21 0742 01/15/21 1103  BP: 92/62  (!) 100/58 106/64  Pulse: 86  91 92  Resp:   18 18  Temp: 97.9 F (36.6 C)  98 F (36.7 C) 98.1 F (36.7 C)  TempSrc: Oral  Oral Oral  SpO2: 99%  100%   Weight:  57.4 kg    Height:       Weight change: -0.635 kg  Intake/Output Summary (Last 24 hours) at 01/15/2021 1344 Last data filed at 01/15/2021 0620 Gross per 24 hour  Intake 2761.34 ml  Output 1100 ml  Net 1661.34 ml    Constitutional: General:   Alert,  Well-developed, well-nourished, pleasant and cooperative in NAD BP 106/64 (BP Location: Left Arm)   Pulse 92   Temp 98.1 F (36.7 C) (Oral)   Resp 18   Ht 5\' 3"  (1.6 m)   Wt 57.4 kg   LMP 10/15/2020 (Exact Date)   SpO2 100%   BMI 22.43 kg/m   Eyes:  Sclera clear, no icterus.   Conjunctiva pink.   Ears:  No scars, lesions or masses, Normal auditory acuity. Nose:  No deformity, discharge, or lesions. Mouth:  No deformity or lesions, oropharynx pink & moist.  Neck:  Supple; no masses, trachea midline  Respiratory: Normal respiratory effort  Gastrointestinal:  Soft, non-tender and non-distended without masses, hepatosplenomegaly or hernias noted.  No guarding or rebound tenderness.     Cardiac: No  clubbing or edema.  No cyanosis. Normal posterior tibial pedal pulses noted.  Lymphatic:  No significant cervical adenopathy.  Psych:  Alert and cooperative. Normal mood and affect.  Musculoskeletal:  Head normocephalic, atraumatic. 5/5 Lower extremity strength bilaterally.  Skin: Warm. Intact without significant lesions or rashes. No jaundice.  Neurologic:  Face symmetrical, tongue midline, Normal sensation to touch  Psych:  Alert and oriented x3, Alert and cooperative. Normal mood and affect.   Lab Results: Lab Results  Component Value Date   WBC 9.3 01/11/2021   HGB 15.0 01/11/2021   HCT 40.0 01/11/2021   MCV 82.8 01/11/2021   PLT 258 01/11/2021   Micro Results: Recent Results (from the past 240 hour(s))  Resp Panel by RT-PCR (Flu A&B, Covid) Nasopharyngeal Swab     Status: None   Collection Time: 01/11/21  4:12 PM   Specimen: Nasopharyngeal Swab; Nasopharyngeal(NP) swabs in vial transport medium  Result Value Ref Range Status   SARS Coronavirus 2 by RT PCR NEGATIVE NEGATIVE Final    Comment: (NOTE) SARS-CoV-2 target nucleic acids are NOT DETECTED.  The SARS-CoV-2 RNA is generally detectable in upper respiratory specimens during the acute phase of infection. The lowest concentration of SARS-CoV-2 viral copies this assay can detect is 138 copies/mL. A negative result does not preclude SARS-Cov-2 infection and should not be used as  the sole basis for treatment or other patient management decisions. A negative result may occur with  improper specimen collection/handling, submission of specimen other than nasopharyngeal swab, presence of viral mutation(s) within the areas targeted by this assay, and inadequate number of viral copies(<138 copies/mL). A negative result must be combined with clinical observations, patient history, and epidemiological information. The expected result is Negative.  Fact Sheet for Patients:  BloggerCourse.com  Fact  Sheet for Healthcare Providers:  SeriousBroker.it  This test is no t yet approved or cleared by the [place] FDA and  has been authorized for detection and/or diagnosis of SARS-CoV-2 by FDA under an Emergency Use Authorization (EUA). This EUA will remain  in effect (meaning this test can be used) for the duration of the COVID-19 declaration under Section 564(b)(1) of the Act, 21 U.S.C.section 360bbb-3(b)(1), unless the authorization is terminated  or revoked sooner.       Influenza A by PCR NEGATIVE NEGATIVE Final   Influenza B by PCR NEGATIVE NEGATIVE Final    Comment: (NOTE) The Xpert Xpress SARS-CoV-2/FLU/RSV plus assay is intended as an aid in the diagnosis of influenza from Nasopharyngeal swab specimens and should not be used as a sole basis for treatment. Nasal washings and aspirates are unacceptable for Xpert Xpress SARS-CoV-2/FLU/RSV testing.  Fact Sheet for Patients: BloggerCourse.com  Fact Sheet for Healthcare Providers: SeriousBroker.it  This test is not yet approved or cleared by the [place] FDA and has been authorized for detection and/or diagnosis of SARS-CoV-2 by FDA under an Emergency Use Authorization (EUA). This EUA will remain in effect (meaning this test can be used) for the duration of the COVID-19 declaration under Section 564(b)(1) of the Act, 21 U.S.C. section 360bbb-3(b)(1), unless the authorization is terminated or revoked.  Performed at [facility], [address] [place], [place] [address]   Urine Culture     Status: None   Collection Time: 01/11/21  4:21 PM   Specimen: Urine, Random  Result Value Ref Range Status   Specimen Description   Final    URINE, RANDOM Performed at [facility], [address], [place], [place] [address]    Special Requests   Final    NONE Performed at [facility], [address], [place],  [place] [address]    Culture   Final    NO GROWTH Performed at [facility], [address], [place], [place] [address]    Report Status 01/13/2021 FINAL  Final  Gastrointestinal Panel by PCR , Stool     Status: None   Collection Time: 01/14/21  4:35 PM   Specimen: Stool  Result Value Ref Range Status   Campylobacter species NOT DETECTED NOT DETECTED Final   Plesimonas shigelloides NOT DETECTED NOT DETECTED Final   Salmonella species NOT DETECTED NOT DETECTED Final   Yersinia enterocolitica NOT DETECTED NOT DETECTED Final   Vibrio species NOT DETECTED NOT DETECTED Final   Vibrio cholerae NOT DETECTED NOT DETECTED Final   Enteroaggregative E coli (EAEC) NOT DETECTED NOT DETECTED Final   Enteropathogenic E coli (EPEC) NOT DETECTED NOT DETECTED Final   Enterotoxigenic E coli (ETEC) NOT DETECTED NOT DETECTED Final   Shiga like toxin producing E coli (STEC) NOT DETECTED NOT DETECTED Final   Shigella/Enteroinvasive E coli (EIEC) NOT DETECTED NOT DETECTED Final   Cryptosporidium NOT DETECTED NOT DETECTED Final   Cyclospora cayetanensis NOT DETECTED NOT DETECTED Final   Entamoeba histolytica NOT DETECTED NOT DETECTED Final   Giardia lamblia  NOT DETECTED NOT DETECTED Final   Adenovirus F40/41 NOT DETECTED NOT DETECTED Final   Astrovirus NOT DETECTED NOT DETECTED Final   Norovirus GI/GII NOT DETECTED NOT DETECTED Final   Rotavirus A NOT DETECTED NOT DETECTED Final   Sapovirus (I, II, IV, and V) NOT DETECTED NOT DETECTED Final    Comment: Performed at [facility], [address], [place], [place] [address]   Studies/Results: No results found. Medications:  Scheduled Meds:  (feeding supplement) PROSource Plus  30 mL Oral BID BM   docusate sodium  100 mg Oral BID   vitamin B-6  25 mg Oral BID   And   doxylamine (Sleep)  25 mg Oral BID   famotidine  20 mg Oral Q12H   Or   famotidine (PEPCID) IV  20 mg Intravenous Q12H   feeding supplement  1 Container Oral TID BM   [START ON  01/16/2021] methylPREDNISolone  8 mg Oral Q breakfast   Followed by   Melene Muller ON 01/23/2021] methylPREDNISolone  4 mg Oral Q breakfast   methylPREDNISolone  8 mg Oral Q1400   Followed by   Melene Muller ON 01/17/2021] methylPREDNISolone  4 mg Oral Q1400   methylPREDNISolone  8 mg Oral QHS   Followed by   Melene Muller ON 01/18/2021] methylPREDNISolone  4 mg Oral QHS   Continuous Infusions:  dextrose 5 % and 0.45 % NaCl with KCl 20 mEq/L 75 mL/hr at 01/15/21 0620   ondansetron (ZOFRAN) IV     PRN Meds:.hydrOXYzine **OR** hydrOXYzine, loperamide, ondansetron **OR** ondansetron (ZOFRAN) IV **OR** ondansetron (ZOFRAN) IV, promethazine **OR** promethazine   Assessment: Loose stools Elevated liver enzymes Nausea/vomiting   Plan: Patient nausea vomiting due to hyperemesis gravidarum is improved.  She is tolerating oral diet better at this time.  elevated liver enzymes also continue to improve as her hyperemesis gravidarum symptoms improve  Continue steroid treatments for this as per primary team.  Continue antiemetics as needed  GI panel was negative for any infectious etiology.  C. difficile testing was attempted to be ordered but gave an alert to Dr. Jerene Pitch that it need to be approved by ID.  I reached out to Dr. Jerene Pitch to inquire if this is able to be done but have not heard back yet.  Fecal calprotectin was collected and is pending  Patient was advised that Imodium use as needed may help with her loose stools.  She can try taking 1 pill at a time in a day and if it does not cause her constipation, she can increase it to 2 to 3 pills a day, since her main concern for taking it is constipation. She verbalized understanding.   Continue small frequent meals  Once patient is discharged, she can follow-up in clinic as an outpatient as well so we can assess improvement or changes in her bowel habits   LOS: 3 days   Robin Bouillon, MD 01/15/2021, 1:44 PM

## 2021-01-16 ENCOUNTER — Telehealth: Payer: Self-pay

## 2021-01-16 NOTE — Telephone Encounter (Signed)
Pt has been scheduled for 10/19 for NP appt 4-6 wk f/u

## 2021-01-16 NOTE — Telephone Encounter (Signed)
-----   Message from Pasty Spillers, MD sent at 01/15/2021  1:52 PM EDT ----- Please make clinic follow-up for 4 to 6 weeks

## 2021-01-17 LAB — CALPROTECTIN, FECAL: Calprotectin, Fecal: 150 ug/g — ABNORMAL HIGH (ref 0–120)

## 2021-01-22 ENCOUNTER — Encounter: Payer: Self-pay | Admitting: Obstetrics and Gynecology

## 2021-01-22 ENCOUNTER — Other Ambulatory Visit: Payer: Self-pay

## 2021-01-22 ENCOUNTER — Ambulatory Visit (INDEPENDENT_AMBULATORY_CARE_PROVIDER_SITE_OTHER): Payer: Medicaid Other | Admitting: Obstetrics and Gynecology

## 2021-01-22 VITALS — BP 98/68 | Ht 63.0 in | Wt 129.6 lb

## 2021-01-22 DIAGNOSIS — R1110 Vomiting, unspecified: Secondary | ICD-10-CM

## 2021-01-22 DIAGNOSIS — Z3402 Encounter for supervision of normal first pregnancy, second trimester: Secondary | ICD-10-CM

## 2021-01-22 DIAGNOSIS — Z3A14 14 weeks gestation of pregnancy: Secondary | ICD-10-CM

## 2021-01-22 LAB — POCT URINALYSIS DIPSTICK OB
Glucose, UA: NEGATIVE
POC,PROTEIN,UA: NEGATIVE

## 2021-01-22 NOTE — Progress Notes (Signed)
Routine Prenatal Care Visit  Subjective  Robin Campbell is a 20 y.o. G1P0 at [redacted]w[redacted]d being seen today for ongoing prenatal care.  She is currently monitored for the following issues for this low-risk pregnancy and has Encounter for supervision of normal first pregnancy in first trimester; Hyperemesis affecting pregnancy, antepartum; Hyperemesis gravidarum; Dehydration; Hypokalemia; Ketonuria; Transaminitis; and Asymptomatic bacteriuria during pregnancy on their problem list.  ----------------------------------------------------------------------------------- Patient reports no complaints.   Contractions: Not present. Vag. Bleeding: None.  Movement: Absent. Denies leaking of fluid.  ----------------------------------------------------------------------------------- The following portions of the patient's history were reviewed and updated as appropriate: allergies, current medications, past family history, past medical history, past social history, past surgical history and problem list. Problem list updated.   Objective  Blood pressure 98/68, height 5\' 3"  (1.6 m), weight 129 lb 9.6 oz (58.8 kg), last menstrual period 10/15/2020. Pregravid weight 147 lb (66.7 kg) Total Weight Gain -17 lb 6.4 oz (-7.893 kg) Urinalysis:      Fetal Status: Fetal Heart Rate (bpm): 160   Movement: Absent     General:  Alert, oriented and cooperative. Patient is in no acute distress.  Skin: Skin is warm and dry. No rash noted.   Cardiovascular: Normal heart rate noted  Respiratory: Normal respiratory effort, no problems with respiration noted  Abdomen: Soft, gravid, appropriate for gestational age. Pain/Pressure: Absent     Pelvic:  Cervical exam deferred        Extremities: Normal range of motion.  Edema: None  Mental Status: Normal mood and affect. Normal behavior. Normal judgment and thought content.     Assessment   20 y.o. G1P0 at [redacted]w[redacted]d by  07/22/2021, by Last Menstrual Period presenting for routine  prenatal visit  Plan   pregnancy 1 Problems (from 12/11/20 to present)     Problem Noted Resolved   Encounter for supervision of normal first pregnancy in first trimester 12/11/2020 by 02/10/2021, CNM No   Overview Addendum 01/22/2021 11:37 AM by 01/24/2021, MD     Nursing Staff Provider  Office Location  [facility] Dating  LMP, confirmed 9 wk Natale Milch  Language  English Anatomy US    Flu Vaccine   Genetic Screen  NIPS: normal xy  TDaP vaccine    Hgb A1C or  GTT Early : NA Third trimester :   Covid    LAB RESULTS   Rhogam  Not needed Blood Type O/Positive/-- (09/07 1047)   Feeding Plan  Antibody Negative (09/07 1047)  Contraception  Rubella 3.95 (09/07 1047)  Circumcision  RPR Non Reactive (09/07 1047)   Pediatrician   HBsAg Negative (09/07 1047)   Support Person Boyfriend [place] HIV Non Reactive (09/07 1047)  Prenatal Classes Advised Varicella immune    GBS  (For PCN allergy, check sensitivities)   BTL Consent NO    VBAC Consent N/A Pap  Not of age       Pelvis Tested NA                She reports that she has been feeling better since leaving the hospital.  She is currently taking 8 mg of prednisone and is tapering to 4 mg tomorrow.  She is taking B6 and Unisom twice a day.  She reports that her symptoms are controlled with this.  She is able to eat and keep fluids down.  She is not having nausea.  She has follow-up planned with GI doctor for mid October.  Encouraged the patient to keep  this appointment.  She reports that she is having less issues with multiple bowel movements at the moment and more issues with constipation.  We discussed use of over-the-counter Colace stool softener.  Also encouraged to add fiber to her diet such as fruits and oatmeal.  Gestational age appropriate obstetric precautions including but not limited to vaginal bleeding, contractions, leaking of fluid and fetal movement were reviewed in detail with the patient.    Return in about 2 weeks  (around 02/05/2021) for ROB iwth MD.  Natale Milch MD [facility], [facility] 01/22/2021, 11:38 AM

## 2021-01-22 NOTE — Addendum Note (Signed)
Addended by: Clement Husbands A on: 01/22/2021 04:24 PM   Modules accepted: Orders

## 2021-01-24 ENCOUNTER — Telehealth: Payer: Self-pay

## 2021-01-24 NOTE — Telephone Encounter (Signed)
Change appointment to Dr. Allegra Lai for 02/05/2021 at 1:00 sent patient a mychart message informing patient

## 2021-01-24 NOTE — Telephone Encounter (Signed)
-----   Message from Pasty Spillers, MD sent at 01/23/2021  9:12 PM EDT ----- Morrie Sheldon, can you please let this pt know her fecal calprotectin was high and given her diarrhea symptoms, it may represent IBD. Since she has never been seen in clinic before, I talked to Dr. Allegra Lai about establishing care with her. Can you please change her appt from me to Dr. Allegra Lai, for the next 1-2 weeks please.

## 2021-02-01 DIAGNOSIS — Z419 Encounter for procedure for purposes other than remedying health state, unspecified: Secondary | ICD-10-CM | POA: Diagnosis not present

## 2021-02-04 ENCOUNTER — Encounter: Payer: Medicaid Other | Admitting: Obstetrics

## 2021-02-05 ENCOUNTER — Ambulatory Visit: Payer: Medicaid Other | Admitting: Gastroenterology

## 2021-02-06 ENCOUNTER — Other Ambulatory Visit: Payer: Self-pay

## 2021-02-06 ENCOUNTER — Ambulatory Visit (INDEPENDENT_AMBULATORY_CARE_PROVIDER_SITE_OTHER): Payer: Medicaid Other | Admitting: Obstetrics and Gynecology

## 2021-02-06 ENCOUNTER — Ambulatory Visit: Payer: Medicaid Other | Admitting: Gastroenterology

## 2021-02-06 ENCOUNTER — Encounter: Payer: Self-pay | Admitting: Obstetrics and Gynecology

## 2021-02-06 VITALS — BP 110/70 | Wt 135.0 lb

## 2021-02-06 DIAGNOSIS — O210 Mild hyperemesis gravidarum: Secondary | ICD-10-CM

## 2021-02-06 DIAGNOSIS — Z3A16 16 weeks gestation of pregnancy: Secondary | ICD-10-CM

## 2021-02-06 DIAGNOSIS — Z3402 Encounter for supervision of normal first pregnancy, second trimester: Secondary | ICD-10-CM

## 2021-02-06 NOTE — Progress Notes (Signed)
  Routine Prenatal Care Visit  Subjective  Robin Campbell is a 20 y.o. G1P0 at [redacted]w[redacted]d being seen today for ongoing prenatal care.  She is currently monitored for the following issues for this low-risk pregnancy and has Encounter for supervision of normal first pregnancy in first trimester; Hyperemesis affecting pregnancy, antepartum; Hyperemesis gravidarum; Dehydration; Hypokalemia; Ketonuria; Transaminitis; and Asymptomatic bacteriuria during pregnancy on their problem list.  ----------------------------------------------------------------------------------- Patient reports no complaints.   Contractions: Not present. Vag. Bleeding: None.  Movement: Absent. Leaking Fluid denies.  ----------------------------------------------------------------------------------- The following portions of the patient's history were reviewed and updated as appropriate: allergies, current medications, past family history, past medical history, past social history, past surgical history and problem list. Problem list updated.  Objective  Blood pressure 110/70, weight 135 lb (61.2 kg), last menstrual period 10/15/2020. Pregravid weight 147 lb (66.7 kg) Total Weight Gain -12 lb (-5.443 kg) Urinalysis: Urine Protein    Urine Glucose    Fetal Status: Fetal Heart Rate (bpm): 160   Movement: Absent     General:  Alert, oriented and cooperative. Patient is in no acute distress.  Skin: Skin is warm and dry. No rash noted.   Cardiovascular: Normal heart rate noted  Respiratory: Normal respiratory effort, no problems with respiration noted  Abdomen: Soft, gravid, appropriate for gestational age. Pain/Pressure: Absent     Pelvic:  Cervical exam deferred        Extremities: Normal range of motion.  Edema: None  Mental Status: Normal mood and affect. Normal behavior. Normal judgment and thought content.   Assessment   20 y.o. G1P0 at [redacted]w[redacted]d by  07/22/2021, by Last Menstrual Period presenting for routine prenatal  visit  Plan   pregnancy 1 Problems (from 12/11/20 to present)     Problem Noted Resolved   Encounter for supervision of normal first pregnancy in first trimester 12/11/2020 by Tresea Mall, CNM No   Overview Addendum 01/22/2021 11:40 AM by Natale Milch, MD     Nursing Staff Provider  Office Location  [facility] Dating  LMP, confirmed 9 wk [place]  Language  English Anatomy US    Flu Vaccine   Genetic Screen  NIPS: normal xy  TDaP vaccine    Hgb A1C or  GTT Early : NA Third trimester :   Covid    LAB RESULTS   Rhogam  Not needed Blood Type O/Positive/-- (09/07 1047)   Feeding Plan  Antibody Negative (09/07 1047)  Contraception  Rubella 3.95 (09/07 1047)  Circumcision  RPR Non Reactive (09/07 1047)   Pediatrician   HBsAg Negative (09/07 1047)   Support Person Boyfriend [place] HIV Non Reactive (09/07 1047)  Prenatal Classes Advised Varicella Immune    GBS  (For PCN allergy, check sensitivities)   BTL Consent NO    VBAC Consent N/A Pap  Not of age       Pelvis Tested NA                Preterm labor symptoms and general obstetric precautions including but not limited to vaginal bleeding, contractions, leaking of fluid and fetal movement were reviewed in detail with the patient. Please refer to After Visit Summary for other counseling recommendations.   - anatomy u/s ordered - feeling better from a nausea standpoint with good interval weight gain.  Return in about 4 weeks (around 03/06/2021) for ROB.   Thomasene Mohair, MD, [facility], [facility] 02/06/2021 12:11 PM

## 2021-02-07 ENCOUNTER — Encounter: Payer: Medicaid Other | Admitting: Advanced Practice Midwife

## 2021-02-19 ENCOUNTER — Ambulatory Visit: Payer: Medicaid Other | Admitting: Gastroenterology

## 2021-03-04 DIAGNOSIS — Z419 Encounter for procedure for purposes other than remedying health state, unspecified: Secondary | ICD-10-CM | POA: Diagnosis not present

## 2021-03-07 ENCOUNTER — Encounter: Payer: Medicaid Other | Admitting: Advanced Practice Midwife

## 2021-03-11 ENCOUNTER — Other Ambulatory Visit: Payer: Self-pay

## 2021-03-11 ENCOUNTER — Ambulatory Visit
Admission: RE | Admit: 2021-03-11 | Discharge: 2021-03-11 | Disposition: A | Discharge status: Home / Self Care | Payer: Medicaid Other | Source: Ambulatory Visit | Attending: Obstetrics and Gynecology | Admitting: Obstetrics and Gynecology

## 2021-03-11 DIAGNOSIS — Z362 Encounter for other antenatal screening follow-up: Secondary | ICD-10-CM | POA: Diagnosis not present

## 2021-03-11 DIAGNOSIS — Z3A21 21 weeks gestation of pregnancy: Secondary | ICD-10-CM | POA: Diagnosis not present

## 2021-03-11 DIAGNOSIS — Z3402 Encounter for supervision of normal first pregnancy, second trimester: Secondary | ICD-10-CM | POA: Diagnosis not present

## 2021-03-14 ENCOUNTER — Other Ambulatory Visit: Payer: Self-pay

## 2021-03-14 ENCOUNTER — Ambulatory Visit (INDEPENDENT_AMBULATORY_CARE_PROVIDER_SITE_OTHER): Payer: Medicaid Other | Admitting: Advanced Practice Midwife

## 2021-03-14 ENCOUNTER — Encounter: Payer: Self-pay | Admitting: Advanced Practice Midwife

## 2021-03-14 VITALS — BP 100/60 | Wt 139.0 lb

## 2021-03-14 DIAGNOSIS — Z3402 Encounter for supervision of normal first pregnancy, second trimester: Secondary | ICD-10-CM

## 2021-03-14 DIAGNOSIS — Z3A21 21 weeks gestation of pregnancy: Secondary | ICD-10-CM

## 2021-03-14 DIAGNOSIS — Z369 Encounter for antenatal screening, unspecified: Secondary | ICD-10-CM

## 2021-03-14 LAB — POCT URINALYSIS DIPSTICK OB
Glucose, UA: NEGATIVE
POC,PROTEIN,UA: NEGATIVE

## 2021-03-14 NOTE — Progress Notes (Signed)
ROB - anatomy 03/11/21,  no concerns. RM 6

## 2021-03-14 NOTE — Progress Notes (Signed)
  Routine Prenatal Care Visit  Subjective  Robin Campbell is a 20 y.o. G1P0 at [redacted]w[redacted]d being seen today for ongoing prenatal care.  She is currently monitored for the following issues for this low-risk pregnancy and has Encounter for supervision of normal first pregnancy in first trimester; Hyperemesis affecting pregnancy, antepartum; Hyperemesis gravidarum; Dehydration; Hypokalemia; Ketonuria; Transaminitis; and Asymptomatic bacteriuria during pregnancy on their problem list.  ----------------------------------------------------------------------------------- Patient reports she has an appetite and is eating without vomiting. Reviewed pregnancy eating guidelines.   Contractions: Not present. Vag. Bleeding: None.  Movement: Present. Leaking Fluid denies.  ----------------------------------------------------------------------------------- The following portions of the patient's history were reviewed and updated as appropriate: allergies, current medications, past family history, past medical history, past social history, past surgical history and problem list. Problem list updated.  Objective  Blood pressure 100/60, weight 139 lb (63 kg), last menstrual period 10/15/2020. Pregravid weight 147 lb (66.7 kg) Total Weight Gain -8 lb (-3.629 kg) Urinalysis: Urine Protein Negative  Urine Glucose Negative  Fetal Status: Fetal Heart Rate (bpm): 154 Fundal Height: 21 cm Movement: Present      Anatomy scan 03/11/21: incomplete for Upper Lip and otherwise complete, normal, female, placenta anterior  General:  Alert, oriented and cooperative. Patient is in no acute distress.  Skin: Skin is warm and dry. No rash noted.   Cardiovascular: Normal heart rate noted  Respiratory: Normal respiratory effort, no problems with respiration noted  Abdomen: Soft, gravid, appropriate for gestational age. Pain/Pressure: Absent     Pelvic:  Cervical exam deferred        Extremities: Normal range of motion.  Edema: None   Mental Status: Normal mood and affect. Normal behavior. Normal judgment and thought content.   Assessment   20 y.o. G1P0 at [redacted]w[redacted]d by  07/22/2021, by Last Menstrual Period presenting for routine prenatal visit  Plan   pregnancy 1 Problems (from 12/11/20 to present)    Problem Noted Resolved   Encounter for supervision of normal first pregnancy in first trimester 12/11/2020 by Tresea Mall, CNM No   Overview Addendum 01/22/2021 11:40 AM by Natale Milch, MD     Nursing Staff Provider  Office Location  [facility] Dating  LMP, confirmed 9 wk [place]  Language  English Anatomy US    Flu Vaccine   Genetic Screen  NIPS: normal xy  TDaP vaccine    Hgb A1C or  GTT Early : NA Third trimester :   Covid    LAB RESULTS   Rhogam  Not needed Blood Type O/Positive/-- (09/07 1047)   Feeding Plan  Antibody Negative (09/07 1047)  Contraception  Rubella 3.95 (09/07 1047)  Circumcision  RPR Non Reactive (09/07 1047)   Pediatrician   HBsAg Negative (09/07 1047)   Support Person Boyfriend [place] HIV Non Reactive (09/07 1047)  Prenatal Classes Advised Varicella Immune    GBS  (For PCN allergy, check sensitivities)   BTL Consent NO    VBAC Consent N/A Pap  Not of age       Pelvis Tested NA               Preterm labor symptoms and general obstetric precautions including but not limited to vaginal bleeding, contractions, leaking of fluid and fetal movement were reviewed in detail with the patient. Please refer to After Visit Summary for other counseling recommendations.   Return in about 4 weeks (around 04/11/2021) for f/u anatomy and rob after.  Tresea Mall, CNM 03/14/2021 9:51 AM

## 2021-04-03 DIAGNOSIS — Z419 Encounter for procedure for purposes other than remedying health state, unspecified: Secondary | ICD-10-CM | POA: Diagnosis not present

## 2021-04-10 ENCOUNTER — Ambulatory Visit
Admission: RE | Admit: 2021-04-10 | Discharge: 2021-04-10 | Disposition: A | Discharge status: Home / Self Care | Payer: Medicaid Other | Source: Ambulatory Visit | Attending: Advanced Practice Midwife | Admitting: Advanced Practice Midwife

## 2021-04-10 DIAGNOSIS — Z3402 Encounter for supervision of normal first pregnancy, second trimester: Secondary | ICD-10-CM | POA: Insufficient documentation

## 2021-04-10 DIAGNOSIS — Z3A25 25 weeks gestation of pregnancy: Secondary | ICD-10-CM | POA: Diagnosis not present

## 2021-04-10 DIAGNOSIS — Z369 Encounter for antenatal screening, unspecified: Secondary | ICD-10-CM | POA: Insufficient documentation

## 2021-04-10 DIAGNOSIS — Z3492 Encounter for supervision of normal pregnancy, unspecified, second trimester: Secondary | ICD-10-CM | POA: Diagnosis not present

## 2021-04-11 ENCOUNTER — Ambulatory Visit (INDEPENDENT_AMBULATORY_CARE_PROVIDER_SITE_OTHER): Payer: Medicaid Other | Admitting: Obstetrics

## 2021-04-11 ENCOUNTER — Other Ambulatory Visit: Payer: Self-pay

## 2021-04-11 VITALS — BP 120/80 | Wt 147.0 lb

## 2021-04-11 DIAGNOSIS — Z3402 Encounter for supervision of normal first pregnancy, second trimester: Secondary | ICD-10-CM

## 2021-04-11 DIAGNOSIS — Z3A25 25 weeks gestation of pregnancy: Secondary | ICD-10-CM

## 2021-04-11 NOTE — Progress Notes (Signed)
  Routine Prenatal Care Visit  Subjective  Robin Campbell is a 20 y.o. G1P0 at [redacted]w[redacted]d being seen today for ongoing prenatal care.  She is currently monitored for the following issues for this low-risk pregnancy and has Encounter for supervision of normal first pregnancy in first trimester; Hyperemesis affecting pregnancy, antepartum; Hyperemesis gravidarum; Dehydration; Hypokalemia; Ketonuria; Transaminitis; and Asymptomatic bacteriuria during pregnancy on their problem list.  ----------------------------------------------------------------------------------- Patient reports no complaints.   Contractions: Not present. Vag. Bleeding: None.  Movement: Present. Leaking Fluid denies.  ----------------------------------------------------------------------------------- The following portions of the patient's history were reviewed and updated as appropriate: allergies, current medications, past family history, past medical history, past social history, past surgical history and problem list. Problem list updated.  Objective  Blood pressure 120/80, weight 147 lb (66.7 kg), last menstrual period 10/15/2020. Pregravid weight 147 lb (66.7 kg) Total Weight Gain 0 lb (0 kg) Urinalysis: Urine Protein    Urine Glucose    Fetal Status:     Movement: Present     General:  Alert, oriented and cooperative. Patient is in no acute distress.  Skin: Skin is warm and dry. No rash noted.   Cardiovascular: Normal heart rate noted  Respiratory: Normal respiratory effort, no problems with respiration noted  Abdomen: Soft, gravid, appropriate for gestational age. Pain/Pressure: Absent     Pelvic:  Cervical exam deferred        Extremities: Normal range of motion.     Mental Status: Normal mood and affect. Normal behavior. Normal judgment and thought content.   Assessment   20 y.o. G1P0 at [redacted]w[redacted]d by  07/22/2021, by Last Menstrual Period presenting for routine prenatal visit  Plan   pregnancy 1 Problems (from  12/11/20 to present)    Problem Noted Resolved   Encounter for supervision of normal first pregnancy in first trimester 12/11/2020 by Tresea Mall, CNM No   Overview Addendum 01/22/2021 11:40 AM by Natale Milch, MD     Nursing Staff Provider  Office Location  [facility] Dating  LMP, confirmed 9 wk [place]  Language  English Anatomy US    Flu Vaccine   Genetic Screen  NIPS: normal xy  TDaP vaccine    Hgb A1C or  GTT Early : NA Third trimester :   Covid    LAB RESULTS   Rhogam  Not needed Blood Type O/Positive/-- (09/07 1047)   Feeding Plan  Antibody Negative (09/07 1047)  Contraception  Rubella 3.95 (09/07 1047)  Circumcision  RPR Non Reactive (09/07 1047)   Pediatrician   HBsAg Negative (09/07 1047)   Support Person Boyfriend [place] HIV Non Reactive (09/07 1047)  Prenatal Classes Advised Varicella Immune    GBS  (For PCN allergy, check sensitivities)   BTL Consent NO    VBAC Consent N/A Pap  Not of age       Pelvis Tested NA               Preterm labor symptoms and general obstetric precautions including but not limited to vaginal bleeding, contractions, leaking of fluid and fetal movement were reviewed in detail with the patient. Please refer to After Visit Summary for other counseling recommendations.   Return in about 4 weeks (around 05/09/2021) for return OB, 28 week labs  Mirna Mires, CNM  04/11/2021 11:00 AM

## 2021-05-04 DIAGNOSIS — Z419 Encounter for procedure for purposes other than remedying health state, unspecified: Secondary | ICD-10-CM | POA: Diagnosis not present

## 2021-05-04 HISTORY — PX: CHOLECYSTECTOMY: SHX55

## 2021-05-04 NOTE — L&D Delivery Note (Signed)
Date of delivery: 07/27/2021 ?Estimated Date of Delivery: 07/22/21 ?Patient's last menstrual period was 10/15/2020 (exact date). ?EGA: [redacted]w[redacted]d ? ?Delivery Note ?At 12:35 PM a viable female was delivered via Vaginal, Spontaneous  ?Presentation: Left Occiput Anterior with left compound hand   ?APGAR: 8, 9; weight 7 lb 0.9 oz (3200 g).   ?Placenta status: Spontaneous, Intact.   ?Cord: 3 vessels with the following complications: None.  Cord pH: NA ? ?Patient pushing well over 2 hours with limited progress. Discussed possible assistance with vacuum as baby is +2 with push and mom is beginning to lose energy. Asked Dr Logan Bores to come in for possible vacuum assist and in the meantime fetus rotated and began to make good progress. Vacuum not needed.  Mom pushed well to deliver a viable female infant.  The head with compound left hand and cord alongside chest followed by shoulders, which delivered without difficulty, and the rest of the body.  No nuchal cord noted.  Baby to mom's chest.  Cord clamped and cut after > 1 min delay.  Cord blood obtained.  Placenta delivered spontaneously, intact, with a 3-vessel cord.  All counts correct.  Hemostasis obtained with IV pitocin and fundal massage.  ? ?Anesthesia: Epidural ?Episiotomy: None ?Lacerations: None ?Suture Repair:  NA ?Est. Blood Loss (mL): 150 ? ?Mom to postpartum.  Baby to Couplet care / Skin to Skin. ? ?Tresea Mall, CNM ?07/27/2021, 1:10 PM ? ? ? ? ?

## 2021-05-09 ENCOUNTER — Other Ambulatory Visit: Payer: Self-pay

## 2021-05-09 ENCOUNTER — Ambulatory Visit (INDEPENDENT_AMBULATORY_CARE_PROVIDER_SITE_OTHER): Payer: Medicaid Other | Admitting: Obstetrics

## 2021-05-09 VITALS — BP 122/74 | Wt 152.0 lb

## 2021-05-09 DIAGNOSIS — Z3A25 25 weeks gestation of pregnancy: Secondary | ICD-10-CM

## 2021-05-09 DIAGNOSIS — Z3402 Encounter for supervision of normal first pregnancy, second trimester: Secondary | ICD-10-CM

## 2021-05-09 DIAGNOSIS — Z3A29 29 weeks gestation of pregnancy: Secondary | ICD-10-CM

## 2021-05-09 DIAGNOSIS — Z3480 Encounter for supervision of other normal pregnancy, unspecified trimester: Secondary | ICD-10-CM

## 2021-05-09 NOTE — Progress Notes (Signed)
No vb. No lof.  

## 2021-05-09 NOTE — Progress Notes (Signed)
°  Routine Prenatal Care Visit  Subjective  Robin Campbell is a 21 y.o. G1P0 at [redacted]w[redacted]d being seen today for ongoing prenatal care.  She is currently monitored for the following issues for this low-risk pregnancy and has Encounter for supervision of normal first pregnancy in first trimester; Hyperemesis affecting pregnancy, antepartum; Hyperemesis gravidarum; Dehydration; Hypokalemia; Ketonuria; Transaminitis; and Asymptomatic bacteriuria during pregnancy on their problem list.  ----------------------------------------------------------------------------------- Patient reports no complaints.  She did not request the glucola when she arrived. Lab contacted and she is provided her glucose after her exam today. Contractions: Not present. Vag. Bleeding: None.  Movement: Present. Leaking Fluid denies.  ----------------------------------------------------------------------------------- The following portions of the patient's history were reviewed and updated as appropriate: allergies, current medications, past family history, past medical history, past social history, past surgical history and problem list. Problem list updated.  Objective  Blood pressure 122/74, weight 152 lb (68.9 kg), last menstrual period 10/15/2020. Pregravid weight 147 lb (66.7 kg) Total Weight Gain 5 lb (2.268 kg) Urinalysis: Urine Protein    Urine Glucose    Fetal Status:     Movement: Present     General:  Alert, oriented and cooperative. Patient is in no acute distress.  Skin: Skin is warm and dry. No rash noted.   Cardiovascular: Normal heart rate noted  Respiratory: Normal respiratory effort, no problems with respiration noted  Abdomen: Soft, gravid, appropriate for gestational age. Pain/Pressure: Absent     Pelvic:  Cervical exam deferred        Extremities: Normal range of motion.     Mental Status: Normal mood and affect. Normal behavior. Normal judgment and thought content.   Assessment   21 y.o. G1P0 at [redacted]w[redacted]d  by  07/22/2021, by Last Menstrual Period presenting for routine prenatal visit  Plan   pregnancy 1 Problems (from 12/11/20 to present)    Problem Noted Resolved   Encounter for supervision of normal first pregnancy in first trimester 12/11/2020 by Tresea Mall, CNM No   Overview Addendum 01/22/2021 11:40 AM by Natale Milch, MD     Nursing Staff Provider  Office Location  [facility] Dating  LMP, confirmed 9 wk [place]  Language  English Anatomy US    Flu Vaccine   Genetic Screen  NIPS: normal xy  TDaP vaccine    Hgb A1C or  GTT Early : NA Third trimester :   Covid    LAB RESULTS   Rhogam  Not needed Blood Type O/Positive/-- (09/07 1047)   Feeding Plan  Antibody Negative (09/07 1047)  Contraception  Rubella 3.95 (09/07 1047)  Circumcision  RPR Non Reactive (09/07 1047)   Pediatrician   HBsAg Negative (09/07 1047)   Support Person Boyfriend [place] HIV Non Reactive (09/07 1047)  Prenatal Classes Advised Varicella Immune    GBS  (For PCN allergy, check sensitivities)   BTL Consent NO    VBAC Consent N/A Pap  Not of age       Pelvis Tested NA               Term labor symptoms and general obstetric precautions including but not limited to vaginal bleeding, contractions, leaking of fluid and fetal movement were reviewed in detail with the patient. Please refer to After Visit Summary for other counseling recommendations.  Provided her glucose drink and will have her labs drawn.  Return in about 2 weeks (around 05/23/2021) for return OB.  Mirna Mires, CNM  05/09/2021 9:23 AM

## 2021-05-10 LAB — 28 WEEK RH+PANEL
Basophils Absolute: 0 10*3/uL (ref 0.0–0.2)
Basos: 0 %
EOS (ABSOLUTE): 0.1 10*3/uL (ref 0.0–0.4)
Eos: 1 %
Gestational Diabetes Screen: 87 mg/dL (ref 70–139)
HIV Screen 4th Generation wRfx: NONREACTIVE
Hematocrit: 33.8 % — ABNORMAL LOW (ref 34.0–46.6)
Hemoglobin: 11.4 g/dL (ref 11.1–15.9)
Immature Grans (Abs): 0.1 10*3/uL (ref 0.0–0.1)
Immature Granulocytes: 1 %
Lymphocytes Absolute: 1.8 10*3/uL (ref 0.7–3.1)
Lymphs: 14 %
MCH: 31.1 pg (ref 26.6–33.0)
MCHC: 33.7 g/dL (ref 31.5–35.7)
MCV: 92 fL (ref 79–97)
Monocytes Absolute: 0.7 10*3/uL (ref 0.1–0.9)
Monocytes: 6 %
Neutrophils Absolute: 9.7 10*3/uL — ABNORMAL HIGH (ref 1.4–7.0)
Neutrophils: 78 %
Platelets: 289 10*3/uL (ref 150–450)
RBC: 3.67 x10E6/uL — ABNORMAL LOW (ref 3.77–5.28)
RDW: 11.4 % — ABNORMAL LOW (ref 11.7–15.4)
RPR Ser Ql: NONREACTIVE
WBC: 12.5 10*3/uL — ABNORMAL HIGH (ref 3.4–10.8)

## 2021-05-23 ENCOUNTER — Ambulatory Visit (INDEPENDENT_AMBULATORY_CARE_PROVIDER_SITE_OTHER): Payer: Medicaid Other | Admitting: Licensed Practical Nurse

## 2021-05-23 ENCOUNTER — Other Ambulatory Visit: Payer: Self-pay

## 2021-05-23 ENCOUNTER — Encounter: Payer: Self-pay | Admitting: Licensed Practical Nurse

## 2021-05-23 VITALS — BP 120/78 | Wt 155.0 lb

## 2021-05-23 DIAGNOSIS — Z3A31 31 weeks gestation of pregnancy: Secondary | ICD-10-CM

## 2021-05-23 DIAGNOSIS — O26843 Uterine size-date discrepancy, third trimester: Secondary | ICD-10-CM

## 2021-05-23 DIAGNOSIS — Z3401 Encounter for supervision of normal first pregnancy, first trimester: Secondary | ICD-10-CM

## 2021-05-23 NOTE — Progress Notes (Signed)
Routine Prenatal Care Visit  Subjective  Robin Campbell is a 21 y.o. G1P0 at [redacted]w[redacted]d being seen today for ongoing prenatal care.  She is currently monitored for the following issues for this low-risk pregnancy and has Encounter for supervision of normal first pregnancy in first trimester; Hyperemesis affecting pregnancy, antepartum; Hyperemesis gravidarum; Dehydration; Hypokalemia; Ketonuria; Transaminitis; and Asymptomatic bacteriuria during pregnancy on their problem list.  ----------------------------------------------------------------------------------- Patient reports no complaints.   -Reviewed 28 wk labs -Desires to breastfeed, has bene watching videos/reading online -Undecided about contraception, does desire another pregnancy for "a while", options reviewed. Contractions: Not present. Vag. Bleeding: None.   . Leaking Fluid denies.  ----------------------------------------------------------------------------------- The following portions of the patient's history were reviewed and updated as appropriate: allergies, current medications, past family history, past medical history, past social history, past surgical history and problem list. Problem list updated.  Objective  Blood pressure 120/78, weight 155 lb (70.3 kg), last menstrual period 10/15/2020. Pregravid weight 147 lb (66.7 kg) Total Weight Gain 8 lb (3.629 kg) Urinalysis: Urine Protein    Urine Glucose    Fetal Status: Fetal Heart Rate (bpm): 150 Fundal Height: 28 cm Movement: Present     General:  Alert, oriented and cooperative. Patient is in no acute distress.  Skin: Skin is warm and dry. No rash noted.   Cardiovascular: Normal heart rate noted  Respiratory: Normal respiratory effort, no problems with respiration noted  Abdomen: Soft, gravid, appropriate for gestational age. Pain/Pressure: Absent     Pelvic:  Cervical exam deferred        Extremities: Normal range of motion.     Mental Status: Normal mood and affect.  Normal behavior. Normal judgment and thought content.   Assessment   21 y.o. G1P0 at [redacted]w[redacted]d by  07/22/2021, by Last Menstrual Period presenting for routine prenatal visit S<D  Plan   pregnancy 1 Problems (from 12/11/20 to present)     Problem Noted Resolved   Encounter for supervision of normal first pregnancy in first trimester 12/11/2020 by Tresea Mall, CNM No   Overview Addendum 05/23/2021  1:27 PM by Ellwood Sayers, CNM     Nursing Staff Provider  Office Location  [facility] Dating  LMP, confirmed 9 wk [place]  Language  English Anatomy US  Norm, norm fu  Flu Vaccine   Genetic Screen  NIPS: normal xy  TDaP vaccine    Hgb A1C or  GTT Early : NA Third trimester : 87  Covid    LAB RESULTS   Rhogam  Not needed Blood Type O/Positive/-- (09/07 1047)   Feeding Plan Breast Antibody Negative (09/07 1047)  Contraception Undecided  Rubella 3.95 (09/07 1047)  Circumcision yes RPR Non Reactive (09/07 1047)   Pediatrician   HBsAg Negative (09/07 1047)   Support Person Boyfriend [place] HIV Non Reactive (09/07 1047)  Prenatal Classes Advised Varicella Immune    GBS  (For PCN allergy, check sensitivities)   BTL Consent NO    VBAC Consent N/A Pap  Not of age       Pelvis Tested NA                Preterm labor symptoms and general obstetric precautions including but not limited to vaginal bleeding, contractions, leaking of fluid and fetal movement were reviewed in detail with the patient. Please refer to After Visit Summary for other counseling recommendations.   Return in about 2 weeks (around 06/06/2021) for ROB.  [place] ordered for growth   Carie Caddy, CNM  [facility], [facility]  05/23/21  1:30 PM

## 2021-05-23 NOTE — Progress Notes (Signed)
No vb. No lof.  

## 2021-06-04 DIAGNOSIS — Z419 Encounter for procedure for purposes other than remedying health state, unspecified: Secondary | ICD-10-CM | POA: Diagnosis not present

## 2021-06-05 ENCOUNTER — Other Ambulatory Visit: Payer: Self-pay

## 2021-06-05 ENCOUNTER — Ambulatory Visit
Admission: RE | Admit: 2021-06-05 | Discharge: 2021-06-05 | Disposition: A | Discharge status: Home / Self Care | Payer: Medicaid Other | Source: Ambulatory Visit | Attending: Licensed Practical Nurse | Admitting: Licensed Practical Nurse

## 2021-06-05 DIAGNOSIS — Z3A33 33 weeks gestation of pregnancy: Secondary | ICD-10-CM | POA: Diagnosis not present

## 2021-06-05 DIAGNOSIS — O26843 Uterine size-date discrepancy, third trimester: Secondary | ICD-10-CM | POA: Diagnosis not present

## 2021-06-09 ENCOUNTER — Ambulatory Visit (INDEPENDENT_AMBULATORY_CARE_PROVIDER_SITE_OTHER): Payer: Medicaid Other | Admitting: Licensed Practical Nurse

## 2021-06-09 ENCOUNTER — Encounter: Payer: Self-pay | Admitting: Licensed Practical Nurse

## 2021-06-09 ENCOUNTER — Other Ambulatory Visit: Payer: Self-pay

## 2021-06-09 VITALS — BP 120/80 | Wt 156.0 lb

## 2021-06-09 DIAGNOSIS — Z23 Encounter for immunization: Secondary | ICD-10-CM

## 2021-06-09 DIAGNOSIS — Z3400 Encounter for supervision of normal first pregnancy, unspecified trimester: Secondary | ICD-10-CM

## 2021-06-09 DIAGNOSIS — Z3A33 33 weeks gestation of pregnancy: Secondary | ICD-10-CM

## 2021-06-09 DIAGNOSIS — Z3401 Encounter for supervision of normal first pregnancy, first trimester: Secondary | ICD-10-CM

## 2021-06-09 NOTE — Addendum Note (Signed)
Addended by: Brien Few on: 06/09/2021 10:09 AM   Modules accepted: Orders

## 2021-06-09 NOTE — Progress Notes (Signed)
Routine Prenatal Care Visit  Subjective  Robin Campbell is a 21 y.o. G1P0 at [redacted]w[redacted]d being seen today for ongoing prenatal care.  She is currently monitored for the following issues for this low-risk pregnancy and has Encounter for supervision of normal first pregnancy in first trimester; Hyperemesis affecting pregnancy, antepartum; Hyperemesis gravidarum; Dehydration; Hypokalemia; Ketonuria; Transaminitis; and Asymptomatic bacteriuria during pregnancy on their problem list.  ----------------------------------------------------------------------------------- Patient reports some cramping the other day, but it want away. Doing ok, sleepy today-woke up late. Reports mood is fine. Baby shower is in a few weeks. Reading about labor ad birth.  -recent [place] for growth shows fetus in 46%, AFI 11.2  Contractions: Irritability. Vag. Bleeding: None.  Movement: Present. Leaking Fluid denies.  ----------------------------------------------------------------------------------- The following portions of the patient's history were reviewed and updated as appropriate: allergies, current medications, past family history, past medical history, past social history, past surgical history and problem list. Problem list updated.  Objective  Blood pressure 120/80, weight 156 lb (70.8 kg), last menstrual period 10/15/2020. Pregravid weight 147 lb (66.7 kg) Total Weight Gain 9 lb (4.082 kg) Urinalysis: Urine Protein    Urine Glucose    Fetal Status:     Movement: Present     General:  Alert, oriented and cooperative. Patient is in no acute distress.  Skin: Skin is warm and dry. No rash noted.   Cardiovascular: Normal heart rate noted  Respiratory: Normal respiratory effort, no problems with respiration noted  Abdomen: Soft, gravid, appropriate for gestational age. Pain/Pressure: Absent     Pelvic:  Cervical exam deferred        Extremities: Normal range of motion.     Mental Status: Normal mood and affect. Normal  behavior. Normal judgment and thought content.   Assessment   21 y.o. G1P0 at [redacted]w[redacted]d by  07/22/2021, by Last Menstrual Period presenting for routine prenatal visit  Plan   pregnancy 1 Problems (from 12/11/20 to present)     Problem Noted Resolved   Encounter for supervision of normal first pregnancy in first trimester 12/11/2020 by Rod Can, CNM No   Overview Addendum 06/09/2021  9:59 AM by Allen Derry, Bee Staff Provider  Office Location  [facility] Dating  LMP, confirmed 9 wk [place]  Language  English Anatomy US  Norm, norm fu  Flu Vaccine   Genetic Screen  NIPS: normal xy  TDaP vaccine   06/09/21 Hgb A1C or  GTT Early : NA Third trimester : 87  Covid    LAB RESULTS   Rhogam  Not needed Blood Type O/Positive/-- (09/07 1047)   Feeding Plan Breast Antibody Negative (09/07 1047)  Contraception Undecided  Rubella 3.95 (09/07 1047)  Circumcision yes RPR Non Reactive (09/07 1047)   Pediatrician   HBsAg Negative (09/07 1047)   Support Person Boyfriend [place] HIV Non Reactive (09/07 1047)  Prenatal Classes Advised Varicella Immune    GBS  (For PCN allergy, check sensitivities)   BTL Consent NO    VBAC Consent N/A Pap  Not of age       Pelvis Tested NA                Preterm labor symptoms and general obstetric precautions including but not limited to vaginal bleeding, contractions, leaking of fluid and fetal movement were reviewed in detail with the patient. Please refer to After Visit Summary for other counseling recommendations.   Return in about 2 weeks (around 06/23/2021) for ROB.  TDAP today  Roberto Scales, [place], [facility]  06/09/21  10:02 AM

## 2021-06-09 NOTE — Addendum Note (Signed)
Addended by: Liliane Shi on: 06/09/2021 10:12 AM   Modules accepted: Orders

## 2021-06-09 NOTE — Patient Instructions (Signed)
34 Week Obstetrics Instructions  PRE-REGISTRATION  Please plan to pre-register at the hospital during the next week.  You can complete the form only by visiting [email or web address]   If you have any questions concerning this please contact the main number at [facility] [phone]  SIGNS OF LABOR  There is never an absolute way for an individual to determine if they ar truly in labor unless they are seen in the office or at the hospital.  However, please follow these guidelines in order to determine if you may potentially be in labor:  A.  Contractions 5 minutes apart for an hour (first baby)  B. Contractions 5 minutes apart for 20-30 minutes (if you have had a baby)  C. If you have any doubts   -If A, B, or C occur, please go to the hospital to be seen in Labor & Delivery [facility] [phone]-the nurse will check you and contact us. Please report to the hospital through the Emergency Room entrance and they will transport you to labor and delivery.  SPONTANEOUS RUPTURE OF MEMBRANES ("Water Breaks")  This is usually evidenced by a gush of fluid from the vagina.  If this occurs during office hours (and you are uncertain if your water has just broken), schedule ana appointment to come into the office to be checked.  After office hours and on weekends, go to the labor and delivery unit where the nurse will check you and contact us.  VAGINAL BLEEDING  A.  "Bloody Show" - this is a thick blood streaked vaginal discharge.  It is normal and should cause no alarm.  B. Heavy vaginal bleeding (similar to that of a period or heavier).  If this occurs, you should go IMMEDIATELY to the labor and delivery unit.  WHEN TO CALL  1. Routine Questions: please make a list of questions you may have and merely ask them at your next visit in the office.  2. Urgent or Worrisome Questions:  During office hours, please  call us at [phone] to leave a message with the nurse and/or provider for assistance.  After hours, the answering system will direct you as to which provider is on call and if your concern requires that they be paged.  If so, follow the instructions on the voicemail and the provider will contact you back at their earliest convenience.  3. Labor or Rupture of Membranes:  This will require an examination.  Please contact the office to schedule an appointment for exam.  After office hours and on the weekends, go to the Labor and Delivery unit at the hospital where the nurse will check you and call us.  You do not need to notify us at night that you are coming.  INSURANCE REGULATIONS/HOSPITAL DISCHARGE It is very important that you understand that not all delivers are the same.  Typically, a vaginal delivery requires a 2 day stay at the hospital and a cesarean delivery requires 4 days in the hospital for recovery.  It is your responsibility to know what time frame your insurance suggests you stay in the hospital for coverage purposes.  However, one must keep in mind that providers use their own judgment and discretion when determining when an individual is able to be discharged.    

## 2021-06-23 ENCOUNTER — Encounter: Payer: Self-pay | Admitting: Licensed Practical Nurse

## 2021-06-23 ENCOUNTER — Other Ambulatory Visit: Payer: Self-pay

## 2021-06-23 ENCOUNTER — Ambulatory Visit (INDEPENDENT_AMBULATORY_CARE_PROVIDER_SITE_OTHER): Payer: Medicaid Other | Admitting: Licensed Practical Nurse

## 2021-06-23 ENCOUNTER — Other Ambulatory Visit (HOSPITAL_COMMUNITY)
Admission: RE | Admit: 2021-06-23 | Discharge: 2021-06-23 | Disposition: A | Discharge status: Home / Self Care | Payer: Medicaid Other | Source: Ambulatory Visit | Attending: Licensed Practical Nurse | Admitting: Licensed Practical Nurse

## 2021-06-23 VITALS — BP 120/60 | Wt 158.0 lb

## 2021-06-23 DIAGNOSIS — Z113 Encounter for screening for infections with a predominantly sexual mode of transmission: Secondary | ICD-10-CM | POA: Diagnosis not present

## 2021-06-23 DIAGNOSIS — Z3400 Encounter for supervision of normal first pregnancy, unspecified trimester: Secondary | ICD-10-CM | POA: Insufficient documentation

## 2021-06-23 DIAGNOSIS — Z3A35 35 weeks gestation of pregnancy: Secondary | ICD-10-CM

## 2021-06-23 DIAGNOSIS — Z3685 Encounter for antenatal screening for Streptococcus B: Secondary | ICD-10-CM

## 2021-06-23 LAB — POCT URINALYSIS DIPSTICK OB
Glucose, UA: NEGATIVE
POC,PROTEIN,UA: NEGATIVE

## 2021-06-23 NOTE — Progress Notes (Signed)
Routine Prenatal Care Visit  Subjective  Robin Campbell is a 21 y.o. G1P0 at [redacted]w[redacted]d being seen today for ongoing prenatal care.  She is currently monitored for the following issues for this low-risk pregnancy and has Encounter for supervision of normal first pregnancy in first trimester; Hyperemesis affecting pregnancy, antepartum; Hyperemesis gravidarum; Dehydration; Hypokalemia; Ketonuria; Transaminitis; and Asymptomatic bacteriuria during pregnancy on their problem list.  ----------------------------------------------------------------------------------- Patient reports no complaints.  Partner present but on phone.  [name] do ing well, sleep is a little improved. No concerns today.  Contractions: Irritability. Vag. Bleeding: None.  Movement: Present. Leaking Fluid denies.  ----------------------------------------------------------------------------------- The following portions of the patient's history were reviewed and updated as appropriate: allergies, current medications, past family history, past medical history, past social history, past surgical history and problem list. Problem list updated.  Objective  Blood pressure 120/60, weight 158 lb (71.7 kg), last menstrual period 10/15/2020. Pregravid weight 147 lb (66.7 kg) Total Weight Gain 11 lb (4.99 kg) Urinalysis: Urine Protein Negative  Urine Glucose Negative  Fetal Status: Fetal Heart Rate (bpm): 130 Fundal Height: 34 cm Movement: Present     General:  Alert, oriented and cooperative. Patient is in no acute distress.  Skin: Skin is warm and dry. No rash noted.   Cardiovascular: Normal heart rate noted  Respiratory: Normal respiratory effort, no problems with respiration noted  Abdomen: Soft, gravid, appropriate for gestational age. Pain/Pressure: Absent     Pelvic:  Cervical exam deferred        Extremities: Normal range of motion.     Mental Status: Normal mood and affect. Normal behavior. Normal judgment and thought content.    Assessment   21 y.o. G1P0 at [redacted]w[redacted]d by  07/22/2021, by Last Menstrual Period presenting for routine prenatal visit  Plan   pregnancy 1 Problems (from 12/11/20 to present)     Problem Noted Resolved   Encounter for supervision of normal first pregnancy in first trimester 12/11/2020 by Tresea Mall, CNM No   Overview Addendum 06/09/2021  9:59 AM by Ellwood Sayers, CNM     Nursing Staff Provider  Office Location  [facility] Dating  LMP, confirmed 9 wk [place]  Language  English Anatomy US  Norm, norm fu  Flu Vaccine   Genetic Screen  NIPS: normal xy  TDaP vaccine   06/09/21 Hgb A1C or  GTT Early : NA Third trimester : 87  Covid    LAB RESULTS   Rhogam  Not needed Blood Type O/Positive/-- (09/07 1047)   Feeding Plan Breast Antibody Negative (09/07 1047)  Contraception Undecided  Rubella 3.95 (09/07 1047)  Circumcision yes RPR Non Reactive (09/07 1047)   Pediatrician   HBsAg Negative (09/07 1047)   Support Person Boyfriend [place] HIV Non Reactive (09/07 1047)  Prenatal Classes Advised Varicella Immune    GBS  (For PCN allergy, check sensitivities)   BTL Consent NO    VBAC Consent N/A Pap  Not of age       Pelvis Tested NA                Preterm labor symptoms and general obstetric precautions including but not limited to vaginal bleeding, contractions, leaking of fluid and fetal movement were reviewed in detail with the patient. Please refer to After Visit Summary for other counseling recommendations.   Return in about 1 week (around 06/30/2021) for ROB.  36 week labs collected  Carie Caddy, CNM  [place], [facility]  06/23/21  11:07 AM

## 2021-06-25 LAB — CERVICOVAGINAL ANCILLARY ONLY
Chlamydia: NEGATIVE
Comment: NEGATIVE
Comment: NORMAL
Neisseria Gonorrhea: NEGATIVE

## 2021-06-27 LAB — CULTURE, BETA STREP (GROUP B ONLY): Strep Gp B Culture: NEGATIVE

## 2021-07-01 ENCOUNTER — Encounter: Payer: Self-pay | Admitting: Advanced Practice Midwife

## 2021-07-01 ENCOUNTER — Ambulatory Visit (INDEPENDENT_AMBULATORY_CARE_PROVIDER_SITE_OTHER): Payer: Medicaid Other | Admitting: Advanced Practice Midwife

## 2021-07-01 ENCOUNTER — Other Ambulatory Visit: Payer: Self-pay

## 2021-07-01 VITALS — BP 107/70 | Ht 63.0 in | Wt 160.8 lb

## 2021-07-01 DIAGNOSIS — Z3A37 37 weeks gestation of pregnancy: Secondary | ICD-10-CM

## 2021-07-01 DIAGNOSIS — Z3401 Encounter for supervision of normal first pregnancy, first trimester: Secondary | ICD-10-CM

## 2021-07-01 LAB — POCT URINALYSIS DIPSTICK OB
Glucose, UA: NEGATIVE
POC,PROTEIN,UA: NEGATIVE

## 2021-07-01 NOTE — Progress Notes (Signed)
Routine Prenatal Care Visit  Subjective  Robin Campbell is a 21 y.o. G1P0 at [redacted]w[redacted]d being seen today for ongoing prenatal care.  She is currently monitored for the following issues for this low-risk pregnancy and has Encounter for supervision of normal first pregnancy in first trimester; Hyperemesis affecting pregnancy, antepartum; Hyperemesis gravidarum; Dehydration; Hypokalemia; Ketonuria; Transaminitis; and Asymptomatic bacteriuria during pregnancy on their problem list.  ----------------------------------------------------------------------------------- Patient reports no complaints.  She reports a good appetite and attributes small weight gain to excessive loss early in pregnancy. Growth scan done in early February was normal at 46%. She feels prepared for delivery and prefers spontaneous labor. Contractions: Not present. Vag. Bleeding: None.  Movement: Present. Leaking Fluid denies.  ----------------------------------------------------------------------------------- The following portions of the patient's history were reviewed and updated as appropriate: allergies, current medications, past family history, past medical history, past social history, past surgical history and problem list. Problem list updated.  Objective  Blood pressure 107/70, height 5\' 3"  (1.6 m), weight 160 lb 12.8 oz (72.9 kg), last menstrual period 10/15/2020. Pregravid weight 147 lb (66.7 kg) Total Weight Gain 13 lb 12.8 oz (6.26 kg) Urinalysis: Urine Protein    Urine Glucose    Fetal Status: Fetal Heart Rate (bpm): 140 Fundal Height: 36 cm Movement: Present     General:  Alert, oriented and cooperative. Patient is in no acute distress.  Skin: Skin is warm and dry. No rash noted.   Cardiovascular: Normal heart rate noted  Respiratory: Normal respiratory effort, no problems with respiration noted  Abdomen: Soft, gravid, appropriate for gestational age. Pain/Pressure: Absent     Pelvic:  Cervical exam deferred         Extremities: Normal range of motion.  Edema: None  Mental Status: Normal mood and affect. Normal behavior. Normal judgment and thought content.   Assessment   21 y.o. G1P0 at [redacted]w[redacted]d by  07/22/2021, by Last Menstrual Period presenting for routine prenatal visit  Plan   pregnancy 1 Problems (from 12/11/20 to present)    Problem Noted Resolved   Encounter for supervision of normal first pregnancy in first trimester 12/11/2020 by 02/10/2021, CNM No   Overview Addendum 06/09/2021  9:59 AM by 08/07/2021, CNM     Nursing Staff Provider  Office Location  [facility] Dating  LMP, confirmed 9 wk Robin Campbell  Language  English Anatomy US  Norm, norm fu  Flu Vaccine   Genetic Screen  NIPS: normal xy  TDaP vaccine   06/09/21 Hgb A1C or  GTT Early : NA Third trimester : 87  Covid    LAB RESULTS   Rhogam  Not needed Blood Type O/Positive/-- (09/07 1047)   Feeding Plan Breast Antibody Negative (09/07 1047)  Contraception Undecided  Rubella 3.95 (09/07 1047)  Circumcision yes RPR Non Reactive (09/07 1047)   Pediatrician   HBsAg Negative (09/07 1047)   Support Person Boyfriend Robin Campbell HIV Non Reactive (09/07 1047)  Prenatal Classes Advised Varicella Immune    GBS  (For PCN allergy, check sensitivities)   BTL Consent NO    VBAC Consent N/A Pap  Not of age       Pelvis Tested NA               Term labor symptoms and general obstetric precautions including but not limited to vaginal bleeding, contractions, leaking of fluid and fetal movement were reviewed in detail with the patient. Please refer to After Visit Summary for other counseling recommendations.   Return in about 1  week (around 07/08/2021) for rob.  Tresea Mall, CNM 07/01/2021 11:21 AM

## 2021-07-02 DIAGNOSIS — Z419 Encounter for procedure for purposes other than remedying health state, unspecified: Secondary | ICD-10-CM | POA: Diagnosis not present

## 2021-07-08 ENCOUNTER — Other Ambulatory Visit: Payer: Self-pay

## 2021-07-08 ENCOUNTER — Encounter: Payer: Self-pay | Admitting: Advanced Practice Midwife

## 2021-07-08 ENCOUNTER — Encounter: Payer: Medicaid Other | Admitting: Obstetrics

## 2021-07-08 ENCOUNTER — Ambulatory Visit (INDEPENDENT_AMBULATORY_CARE_PROVIDER_SITE_OTHER): Payer: Medicaid Other | Admitting: Advanced Practice Midwife

## 2021-07-08 VITALS — BP 100/60

## 2021-07-08 DIAGNOSIS — Z3A38 38 weeks gestation of pregnancy: Secondary | ICD-10-CM

## 2021-07-08 DIAGNOSIS — Z3403 Encounter for supervision of normal first pregnancy, third trimester: Secondary | ICD-10-CM

## 2021-07-08 LAB — POCT URINALYSIS DIPSTICK OB
Glucose, UA: NEGATIVE
POC,PROTEIN,UA: NEGATIVE

## 2021-07-08 NOTE — Progress Notes (Signed)
Routine Prenatal Care Visit ? ?Subjective  ?Robin Campbell is a 21 y.o. G1P0 at [redacted]w[redacted]d being seen today for ongoing prenatal care.  She is currently monitored for the following issues for this low-risk pregnancy and has Encounter for supervision of normal first pregnancy in first trimester; Hyperemesis affecting pregnancy, antepartum; Hyperemesis gravidarum; Dehydration; Hypokalemia; Ketonuria; Transaminitis; and Asymptomatic bacteriuria during pregnancy on their problem list.  ?----------------------------------------------------------------------------------- ?Patient reports no complaints. She is "ready and waiting".  ?Contractions: Not present. Vag. Bleeding: None.  Movement: Present. Leaking Fluid denies.  ?----------------------------------------------------------------------------------- ?The following portions of the patient's history were reviewed and updated as appropriate: allergies, current medications, past family history, past medical history, past social history, past surgical history and problem list. Problem list updated. ? ?Objective  ?Blood pressure 100/60, last menstrual period 10/15/2020. ?Pregravid weight 147 lb (66.7 kg) Total Weight Gain 13 lb 12.8 oz (6.26 kg) ?Urinalysis: Urine Protein    Urine Glucose   ? ?Fetal Status: Fetal Heart Rate (bpm): 134 Fundal Height: 37 cm Movement: Present    ? ?General:  Alert, oriented and cooperative. Patient is in no acute distress.  ?Skin: Skin is warm and dry. No rash noted.   ?Cardiovascular: Normal heart rate noted  ?Respiratory: Normal respiratory effort, no problems with respiration noted  ?Abdomen: Soft, gravid, appropriate for gestational age. Pain/Pressure: Absent     ?Pelvic:  Cervical exam deferred        ?Extremities: Normal range of motion.  Edema: None  ?Mental Status: Normal mood and affect. Normal behavior. Normal judgment and thought content.  ? ?Assessment  ? ?21 y.o. G1P0 at [redacted]w[redacted]d by  07/22/2021, by Last Menstrual Period presenting for  routine prenatal visit ? ?Plan  ? ?pregnancy 1 Problems (from 12/11/20 to present)   ? Problem Noted Resolved  ? Encounter for supervision of normal first pregnancy in first trimester 12/11/2020 by Tresea Mall, CNM No  ? Overview Addendum 06/09/2021  9:59 AM by Ellwood Sayers, CNM  ?   ?Nursing Staff Provider  ?Office Location  [facility] Dating  LMP, confirmed 9 wk [place]  ?Language  Ambulance person [place]  Norm, norm fu  ?Flu Vaccine   Genetic Screen  NIPS: normal xy  ?TDaP vaccine   06/09/21 Hgb A1C or  ?GTT Early : NA ?Third trimester : 56  ?Covid    LAB RESULTS   ?Rhogam  Not needed Blood Type O/Positive/-- (09/07 1047)   ?Feeding Plan Breast Antibody Negative (09/07 1047)  ?Contraception Undecided  Rubella 3.95 (09/07 1047)  ?Circumcision yes RPR Non Reactive (09/07 1047)   ?Pediatrician   HBsAg Negative (09/07 1047)   ?Support Person Boyfriend [place] HIV Non Reactive (09/07 1047)  ?Prenatal Classes Advised Varicella Immune  ?  GBS  (For PCN allergy, check sensitivities)   ?BTL Consent NO    ?VBAC Consent N/A Pap  Not of age  ?     ?Pelvis Tested NA    ? ? ?  ?  ?  ?  ? ?Preterm labor symptoms and general obstetric precautions including but not limited to vaginal bleeding, contractions, leaking of fluid and fetal movement were reviewed in detail with the patient. ? ? ?Return in about 1 week (around 07/15/2021) for rob. ? ?Tresea Mall, CNM ?07/08/2021 1:36 PM   ? ?

## 2021-07-08 NOTE — Patient Instructions (Signed)
Pain Relief During Labor and Delivery Many things can cause pain during labor and delivery, including: Pressure due to the baby moving through the pelvis. Stretching of tissues due to the baby moving through the birth canal. Muscle tension due to anxiety or nervousness. The uterus tightening (contracting)and relaxing to help move the baby. How do I get pain relief during labor and delivery? Discuss your pain relief options with your health care provider during your prenatal visits. Explore the options offered by your hospital or birth center. There are many ways to deal with the pain of labor and delivery. You can try relaxation techniques or doing relaxing activities, taking a warm shower or bath (hydrotherapy), or other methods. There are also many medicines available to help control pain. Relaxation techniques and activities Practice relaxation techniques or do relaxing activities, such as: Focused breathing. Meditation. Visualization. Aroma therapy. Listening to your favorite music. Hypnosis. Hydrotherapy Take a warm shower or bath. This may: Provide comfort and relaxation. Lessen your feeling of pain. Reduce the amount of pain medicine needed. Shorten the length of labor. Other methods Try doing other things, such as: Getting a massage or having counterpressure on your back. Applying warm packs or ice packs. Changing positions often, moving around, or using a birthing ball. Medicines You may be given: Pain medicine through an IV or an injection into a muscle. Pain medicine inserted into your spinal column. Injections of sterile water just under the skin on your lower back. Nitrous oxide inhalation therapy, also called laughing gas. What kinds of medicine are available for pain relief? There are two kinds of medicines that can be used to relieve pain during labor and delivery: Analgesics. These medicines decrease pain without causing you to lose feeling or the ability to move  your muscles. Anesthetics. These medicines block feeling in the body and can decrease your ability to move freely. Both kinds of medicine can cause minor side effects, such as nausea, trouble concentrating, and sleepiness. They can also affect the baby's heart rate before birth and his or her breathing after birth. For this reason, health care providers are careful about when and how much medicine is given. Which medicines are used to provide pain relief? Common medicines The most common medicines used to help manage pain during labor and delivery include: Opioids. Opioids are medicines that decrease how much pain you feel (perception of pain). These medicines can be given through an IV or may be used with anesthetics to block pain. Epidural analgesia. Epidural analgesia is given through a very thin tube that is inserted into the lower back. Medicine is delivered continuously to the area near your spinal column nerves (epidural space). After having this treatment, you may be able to move your legs, but you will not be able to walk. Depending on the amount and type of medicine given, you may lose all feeling in the lower half of your body, or you may have some sensation, including the urge to push. This treatment can be used to give pain relief for a vaginal birth. Sometimes, a numbing medicine is injected into the spinal fluid when an epidural catheter is placed. This provides for immediate relief but only lasts for 1-2 hours. Once it wears off, the epidural will provide pain relief. This is called a combined spinal-epidural (CSE) block. Intrathecal analgesia (spinal analgesia). Intrathecal analgesia is similar to epidural analgesia, but the medicine is injected into the spinal fluid instead of the epidural space. It is usually only given once. It  starts to relieve pain quickly, but the pain relief lasts only 1-2 hours. Pudendal block. This block is done by injecting numbing medicine through the wall of  the vagina and into a nerve in the pelvis. Other medicines Other medicines used to help manage pain during labor and delivery include: Local anesthetics. These are used to numb a small area of the body. They may be used along with another kind of medicine or used to numb the nerves of the vagina, cervix, and perineum during the second stage of labor. Spinal block (spinal anesthesia). Spinal anesthesia is similar to spinal analgesia, but the medicine that is used contains longer-acting numbing medicines and pain medicines. This type of anesthesia can be used for a cesarean delivery and allows you to stay awake for the birth of your baby. General anesthetics cause you to lose consciousness so you do not feel pain. They are usually only used for an emergency cesarean delivery. These medicines are given through an IV or a mask or both. These medicines are used as part of a procedure or for an emergency delivery. Summary Women have many options to help them manage the pain associated with labor and delivery. You can try doing relaxing activities, taking a warm shower or bath, or other methods. There are also many medicines available to help control pain during labor and delivery. Talk with your health care provider about what options are available to you. This information is not intended to replace advice given to you by your health care provider. Make sure you discuss any questions you have with your health care provider. Document Revised: 03/08/2019 Document Reviewed: 03/08/2019 Elsevier Patient Education  2022 [place]. Signs and Symptoms of Labor Labor is the body's natural process of moving the baby and the placenta out of the uterus. The process of labor usually starts when the baby is full-term, between 30 and 41 weeks of pregnancy. Signs and symptoms that you are close to going into labor As your body prepares for labor and the birth of your baby, you may notice the following symptoms in  the weeks and days before true labor starts: Passing a small amount of thick, bloody mucus from your vagina. This is called normal bloody show or losing your mucus plug. This may happen more than a week before labor begins, or right before labor begins, as the opening of the cervix starts to widen (dilate). For some women, the entire mucus plug passes at once. For others, pieces of the mucus plug may gradually pass over several days. Your baby moving (dropping) lower in your pelvis to get into position for birth (lightening). When this happens, you may feel more pressure on your bladder and pelvic bone and less pressure on your ribs. This may make it easier to breathe. It may also cause you to need to urinate more often and have problems with bowel movements. Having "practice contractions," also called Braxton Hicks contractions or false labor. These occur at irregular (unevenly spaced) intervals that are more than 10 minutes apart. False labor contractions are common after exercise or sexual activity. They will stop if you change position, rest, or drink fluids. These contractions are usually mild and do not get stronger over time. They may feel like: A backache or back pain. Mild cramps, similar to menstrual cramps. Tightening or pressure in your abdomen. Other early symptoms include: Nausea or loss of appetite. Diarrhea. Having a sudden burst of energy, or feeling very tired. Mood changes. Having trouble sleeping.  Signs and symptoms that labor has begun Signs that you are in labor may include: Having contractions that come at regular (evenly spaced) intervals and increase in intensity. This may feel like more intense tightening or pressure in your abdomen that moves to your back. Contractions may also feel like rhythmic pain in your upper thighs or back that comes and goes at regular intervals. If you are delivering for the first time, this change in intensity of contractions often occurs at a  more gradual pace. If you have given birth before, you may notice a more rapid progression of contraction changes. Feeling pressure in the vaginal area. Your water breaking (rupture of membranes). This is when the sac of fluid that surrounds your baby breaks. Fluid leaking from your vagina may be clear or blood-tinged. Labor usually starts within 24 hours of your water breaking, but it may take longer to begin. Some people may feel a sudden gush of fluid; others may notice repeatedly damp underwear. Follow these instructions at home:  When labor starts, or if your water breaks, call your health care provider or nurse care line. Based on your situation, they will determine when you should go in for an exam. During early labor, you may be able to rest and manage symptoms at home. Some strategies to try at home include: Breathing and relaxation techniques. Taking a warm bath or shower. Listening to music. Using a heating pad on the lower back for pain. If directed, apply heat to the area as often as told by your health care provider. Use the heat source that your health care provider recommends, such as a moist heat pack or a heating pad. Place a towel between your skin and the heat source. Leave the heat on for 20-30 minutes. Remove the heat if your skin turns bright red. This is especially important if you are unable to feel pain, heat, or cold. You have a greater risk of getting burned. Contact a health care provider if: Your labor has started. Your water breaks. You have nausea, vomiting, or diarrhea. Get help right away if: You have painful, regular contractions that are 5 minutes apart or less. Labor starts before you are [redacted] weeks along in your pregnancy. You have a fever. You have bright red blood coming from your vagina. You do not feel your baby moving. You have a severe headache with or without vision problems. You have chest pain or shortness of breath. These symptoms may  represent a serious problem that is an emergency. Do not wait to see if the symptoms will go away. Get medical help right away. Call your local emergency services (911 in the [place] Do not drive yourself to the hospital. Summary Labor is your body's natural process of moving your baby and the placenta out of your uterus. The process of labor usually starts when your baby is full-term, between 12 and 40 weeks of pregnancy. When labor starts, or if your water breaks, call your health care provider or nurse care line. Based on your situation, they will determine when you should go in for an exam. This information is not intended to replace advice given to you by your health care provider. Make sure you discuss any questions you have with your health care provider. Document Revised: 09/03/2020 Document Reviewed: 09/03/2020 Elsevier Patient Education  [place].

## 2021-07-08 NOTE — Addendum Note (Signed)
Addended by: Cornelius Moras D on: 07/08/2021 01:40 PM ? ? Modules accepted: Orders ? ?

## 2021-07-15 ENCOUNTER — Ambulatory Visit (INDEPENDENT_AMBULATORY_CARE_PROVIDER_SITE_OTHER): Payer: Medicaid Other | Admitting: Licensed Practical Nurse

## 2021-07-15 ENCOUNTER — Encounter: Payer: Self-pay | Admitting: Licensed Practical Nurse

## 2021-07-15 ENCOUNTER — Other Ambulatory Visit: Payer: Self-pay

## 2021-07-15 VITALS — BP 118/60 | Wt 163.0 lb

## 2021-07-15 DIAGNOSIS — Z3403 Encounter for supervision of normal first pregnancy, third trimester: Secondary | ICD-10-CM

## 2021-07-15 DIAGNOSIS — Z3A39 39 weeks gestation of pregnancy: Secondary | ICD-10-CM

## 2021-07-15 LAB — POCT URINALYSIS DIPSTICK OB
Glucose, UA: NEGATIVE
POC,PROTEIN,UA: NEGATIVE

## 2021-07-15 NOTE — Progress Notes (Signed)
Routine Prenatal Care Visit ? ?Subjective  ?Robin Campbell is a 21 y.o. G1P0 at [redacted]w[redacted]d being seen today for ongoing prenatal care.  She is currently monitored for the following issues for this low-risk pregnancy and has Encounter for supervision of normal first pregnancy in first trimester; Hyperemesis affecting pregnancy, antepartum; Hyperemesis gravidarum; Dehydration; Hypokalemia; Ketonuria; Transaminitis; and Asymptomatic bacteriuria during pregnancy on their problem list.  ?----------------------------------------------------------------------------------- ?Patient reports no complaints.  Doing well. Partner present but on phone.  Ready for baby. They gave lots of family nearby for help.  ?Measuring s<D fetus palpates 6lbs  ?Contractions: Not present. Vag. Bleeding: None.  Movement: Present. Leaking Fluid denies.  ?----------------------------------------------------------------------------------- ?The following portions of the patient's history were reviewed and updated as appropriate: allergies, current medications, past family history, past medical history, past social history, past surgical history and problem list. Problem list updated. ? ?Objective  ?Blood pressure 118/60, weight 163 lb (73.9 kg), last menstrual period 10/15/2020. ?Pregravid weight 147 lb (66.7 kg) Total Weight Gain 16 lb (7.258 kg) ?Urinalysis: Urine Protein Negative  Urine Glucose Negative ? ?Fetal Status: Fetal Heart Rate (bpm): 140 Fundal Height: 35 cm Movement: Present    ? ?General:  Alert, oriented and cooperative. Patient is in no acute distress.  ?Skin: Skin is warm and dry. No rash noted.   ?Cardiovascular: Normal heart rate noted  ?Respiratory: Normal respiratory effort, no problems with respiration noted  ?Abdomen: Soft, gravid, appropriate for gestational age. Pain/Pressure: Absent     ?Pelvic:  Cervical exam deferred        ?Extremities: Normal range of motion.     ?Mental Status: Normal mood and affect. Normal behavior.  Normal judgment and thought content.  ? ?Assessment  ? ?21 y.o. G1P0 at [redacted]w[redacted]d by  07/22/2021, by Last Menstrual Period presenting for routine prenatal visit ? ?Plan  ? ?pregnancy 1 Problems (from 12/11/20 to present)   ? ? Problem Noted Resolved  ? Encounter for supervision of normal first pregnancy in first trimester 12/11/2020 by Tresea Mall, CNM No  ? Overview Addendum 06/09/2021  9:59 AM by Ellwood Sayers, CNM  ?   ?Nursing Staff Provider  ?Office Location  [facility] Dating  LMP, confirmed 9 wk [place]  ?Language  Ambulance person [place]  Norm, norm fu  ?Flu Vaccine   Genetic Screen  NIPS: normal xy  ?TDaP vaccine   06/09/21 Hgb A1C or  ?GTT Early : NA ?Third trimester : 60  ?Covid    LAB RESULTS   ?Rhogam  Not needed Blood Type O/Positive/-- (09/07 1047)   ?Feeding Plan Breast Antibody Negative (09/07 1047)  ?Contraception Undecided  Rubella 3.95 (09/07 1047)  ?Circumcision yes RPR Non Reactive (09/07 1047)   ?Pediatrician   HBsAg Negative (09/07 1047)   ?Support Person Boyfriend [place] HIV Non Reactive (09/07 1047)  ?Prenatal Classes Advised Varicella Immune  ?  GBS  (For PCN allergy, check sensitivities)   ?BTL Consent NO    ?VBAC Consent N/A Pap  Not of age  ?     ?Pelvis Tested NA    ? ? ?  ?  ? ?  ?  ? ?Term labor symptoms and general obstetric precautions including but not limited to vaginal bleeding, contractions, leaking of fluid and fetal movement were reviewed in detail with the patient. ?Please refer to After Visit Summary for other counseling recommendations.  ? ?Return in about 1 week (around 07/22/2021) for ROB. ? ?Carie Caddy, CNM  ?Domingo Pulse, [facility]  ?07/15/21  ?  1:28 PM  ? ? ?

## 2021-07-22 ENCOUNTER — Encounter: Payer: Self-pay | Admitting: Obstetrics and Gynecology

## 2021-07-22 ENCOUNTER — Other Ambulatory Visit: Payer: Self-pay

## 2021-07-22 ENCOUNTER — Ambulatory Visit (INDEPENDENT_AMBULATORY_CARE_PROVIDER_SITE_OTHER): Payer: Medicaid Other | Admitting: Obstetrics and Gynecology

## 2021-07-22 VITALS — BP 118/62 | Wt 165.0 lb

## 2021-07-22 DIAGNOSIS — Z3401 Encounter for supervision of normal first pregnancy, first trimester: Secondary | ICD-10-CM

## 2021-07-22 DIAGNOSIS — O480 Post-term pregnancy: Secondary | ICD-10-CM

## 2021-07-22 DIAGNOSIS — Z3A40 40 weeks gestation of pregnancy: Secondary | ICD-10-CM

## 2021-07-22 LAB — POCT URINALYSIS DIPSTICK OB
Glucose, UA: NEGATIVE
POC,PROTEIN,UA: NEGATIVE

## 2021-07-22 NOTE — Progress Notes (Signed)
? ?  PRENATAL VISIT NOTE ? ?Subjective:  ?Robin Campbell is a 21 y.o. G1P0 at [redacted]w[redacted]d being seen today for ongoing prenatal care.  She is currently monitored for the following issues for this low-risk pregnancy and has Encounter for supervision of normal first pregnancy in first trimester; Hyperemesis affecting pregnancy, antepartum; Hyperemesis gravidarum; Dehydration; Hypokalemia; Ketonuria; Transaminitis; and Asymptomatic bacteriuria during pregnancy on their problem list. ? ?Patient reports no complaints.  Contractions: Not present. Vag. Bleeding: None.  Movement: Present. Denies leaking of fluid.  ? ?The following portions of the patient's history were reviewed and updated as appropriate: allergies, current medications, past family history, past medical history, past social history, past surgical history and problem list.  ? ?Objective:  ? ?Vitals:  ? 07/22/21 1345  ?BP: 118/62  ?Weight: 165 lb (74.8 kg)  ? ? ?Fetal Status: Fetal Heart Rate (bpm): 145 Fundal Height: 37 cm Movement: Present    ? ?General:  Alert, oriented and cooperative. Patient is in no acute distress.  ?Skin: Skin is warm and dry. No rash noted.   ?Cardiovascular: Normal heart rate noted  ?Respiratory: Normal respiratory effort, no problems with respiration noted  ?Abdomen: Soft, gravid, appropriate for gestational age.  Pain/Pressure: Absent     ?Pelvic: Cervical exam deferred        ?Extremities: Normal range of motion.  Edema: None  ?Mental Status: Normal mood and affect. Normal behavior. Normal judgment and thought content.  ? ?Assessment and Plan:  ?Pregnancy: G1P0 at [redacted]w[redacted]d ?1. Encounter for supervision of normal first pregnancy in first trimester ?Patient is doing well without complaints ?Patient undecided on contraception ? ?2. Post term pregnancy, antepartum ?Will plan for IOL at 41 weeks- scheduled on 3/28 at 8 am ?Postdate testing with BPP and NST ordered ?- US FETAL BPP W/NONSTRESS; Future ? ?Term labor symptoms and general  obstetric precautions including but not limited to vaginal bleeding, contractions, leaking of fluid and fetal movement were reviewed in detail with the patient. ?Please refer to After Visit Summary for other counseling recommendations.  ? ?Return in about 3 days (around 07/25/2021) for NST. ? ?No future appointments. ? ?Catalina Antigua, MD ? ?

## 2021-07-22 NOTE — Addendum Note (Signed)
Addended by: Burtis Junes on: 07/22/2021 02:01 PM ? ? Modules accepted: Orders ? ?

## 2021-07-22 NOTE — Addendum Note (Signed)
Addended by: Mora Bellman on: 07/22/2021 02:55 PM ? ? Modules accepted: Orders ? ?

## 2021-07-23 ENCOUNTER — Ambulatory Visit
Admission: RE | Admit: 2021-07-23 | Discharge: 2021-07-23 | Disposition: A | Discharge status: Home / Self Care | Payer: Medicaid Other | Source: Ambulatory Visit | Attending: Obstetrics and Gynecology | Admitting: Obstetrics and Gynecology

## 2021-07-23 DIAGNOSIS — O4100X0 Oligohydramnios, unspecified trimester, not applicable or unspecified: Secondary | ICD-10-CM | POA: Diagnosis not present

## 2021-07-23 DIAGNOSIS — Z3A00 Weeks of gestation of pregnancy not specified: Secondary | ICD-10-CM | POA: Diagnosis not present

## 2021-07-23 DIAGNOSIS — O4103X0 Oligohydramnios, third trimester, not applicable or unspecified: Secondary | ICD-10-CM | POA: Diagnosis not present

## 2021-07-23 DIAGNOSIS — O480 Post-term pregnancy: Secondary | ICD-10-CM | POA: Diagnosis not present

## 2021-07-23 DIAGNOSIS — Z3401 Encounter for supervision of normal first pregnancy, first trimester: Secondary | ICD-10-CM | POA: Insufficient documentation

## 2021-07-23 NOTE — Addendum Note (Signed)
Addended by: Meryl Dare on: 07/23/2021 09:39 AM ? ? Modules accepted: Orders ? ?

## 2021-07-25 ENCOUNTER — Encounter: Payer: Self-pay | Admitting: Obstetrics and Gynecology

## 2021-07-25 ENCOUNTER — Ambulatory Visit (INDEPENDENT_AMBULATORY_CARE_PROVIDER_SITE_OTHER): Payer: Medicaid Other | Admitting: Obstetrics and Gynecology

## 2021-07-25 ENCOUNTER — Other Ambulatory Visit: Payer: Self-pay

## 2021-07-25 DIAGNOSIS — Z3403 Encounter for supervision of normal first pregnancy, third trimester: Secondary | ICD-10-CM | POA: Diagnosis not present

## 2021-07-25 LAB — FETAL NONSTRESS TEST

## 2021-07-25 NOTE — Patient Instructions (Signed)
Labor Induction ?Labor induction is when steps are taken to cause a pregnant woman to begin the labor process. Most women go into labor on their own between 37 weeks and 42 weeks of pregnancy. When this does not happen, or when there is a medical need for labor to begin, steps may be taken to induce, or bring on, labor. ?Labor induction causes a pregnant woman's uterus to contract. It also causes the cervix to soften (ripen), open (dilate), and thin out. Usually, labor is not induced before 39 weeks of pregnancy unless there is a medical reason to do so. ?When is labor induction considered? ?Labor induction may be right for you if: ?Your pregnancy lasts longer than 41 to 42 weeks. ?Your placenta is separating from your uterus (placental abruption). ?You have a rupture of membranes and your labor does not begin. ?You have health problems, like diabetes or high blood pressure (preeclampsia) during your pregnancy. ?Your baby has stopped growing or does not have enough amniotic fluid. ?Before labor induction begins, your health care provider will consider the following factors: ?Your medical condition and the baby's condition. ?How many weeks you have been pregnant. ?How mature the baby's lungs are. ?The condition of your cervix. ?The position of the baby. ?The size of your birth canal. ?Tell a health care provider about: ?Any allergies you have. ?All medicines you are taking, including vitamins, herbs, eye drops, creams, and over-the-counter medicines. ?Any problems you or your family members have had with anesthetic medicines. ?Any surgeries you have had. ?Any blood disorders you have. ?Any medical conditions you have. ?What are the risks? ?Generally, this is a safe procedure. However, problems may occur, including: ?Failed induction. ?Changes in fetal heart rate, such as being too high, too low, or irregular (erratic). ?Infection in the mother or the baby. ?Increased risk of having a cesarean delivery. ?Breaking off  (abruption) of the placenta from the uterus. This is rare. ?Rupture of the uterus. This is very rare. ?Your baby could fail to get enough blood flow or oxygen. This can be life-threatening. ?When induction is needed for medical reasons, the benefits generally outweigh the risks. ?What happens during the procedure? ?During the procedure, your health care provider will use one of these methods to induce labor: ?Stripping the membranes. In this method, the amniotic sac tissue is gently separated from the cervix. This causes the following to happen: ?Your cervix stretches, which in turn causes the release of prostaglandins. ?Prostaglandins induce labor and cause the uterus to contract. ?This procedure is often done in an office visit. You will be sent home to wait for contractions to begin. ?Prostaglandin medicine. This medicine starts contractions and causes the cervix to dilate and ripen. This can be taken by mouth (orally) or by being inserted into the vagina (suppository). ?Inserting a small, thin tube (catheter) with a balloon into the vagina and then expanding the balloon with water to dilate the cervix. ?Breaking the water. In this method, a small instrument is used to make a small hole in the amniotic sac. This eventually causes the amniotic sac to break. Contractions should begin within a few hours. ?Medicine to trigger or strengthen contractions. This medicine is given through an IV that is inserted into a vein in your arm. ?This procedure may vary among health care providers and hospitals. ?Where to find more information ?March of D[employer]: www.marchofdimes.org ?[employer]nec[employer]: www.acog.org ?Summary ?Labor induction causes a pregnant woman's uterus to contract. It also causes the cervix  to soften (ripen), open (dilate), and thin out. ?Labor is usually not induced before 39 weeks of pregnancy unless there is a medical reason to do so. ?When induction is needed for medical  reasons, the benefits generally outweigh the risks. ?Talk with your health care provider about which methods of labor induction are right for you. ?This information is not intended to replace advice given to you by your health care provider. Make sure you discuss any questions you have with your health care provider. ?Document Revised: 02/01/2020 Document Reviewed: 02/01/2020 ?Elsevier Patient Education ? 2022 Else[employer] Inc[employer] ?Pain Relief During Labor and Delivery ?Many things can cause pain during labor and delivery, including: ?Pressure due to the baby moving through the pelvis. ?Stretching of tissues due to the baby moving through the birth canal. ?Muscle tension due to anxiety or nervousness. ?The uterus tightening (contracting)and relaxing to help move the baby. ?How do I get pain relief during labor and delivery? ?Discuss your pain relief options with your health care provider during your prenatal visits. Explore the options offered by your hospital or birth center. There are many ways to deal with the pain of labor and delivery. You can try relaxation techniques or doing relaxing activities, taking a warm shower or bath (hydrotherapy), or other methods. There are also many medicines available to help control pain. ?Relaxation techniques and activities ?Practice relaxation techniques or do relaxing activities, such as: ?Focused breathing. ?Meditation. ?Visualization. ?Aroma therapy. ?Listening to your favorite music. ?Hypnosis. ?Hydrotherapy ?Take a warm shower or bath. This may: ?Provide comfort and relaxation. ?Lessen your feeling of pain. ?Reduce the amount of pain medicine needed. ?Shorten the length of labor. ?Other methods ?Try doing other things, such as: ?Getting a massage or having counterpressure on your back. ?Applying warm packs or ice packs. ?Changing positions often, moving around, or using a birthing ball. ?Medicines ?You may be given: ?Pain medicine through an IV or an injection into a  muscle. ?Pain medicine inserted into your spinal column. ?Injections of sterile water just under the skin on your lower back. ?Nitrous oxide inhalation therapy, also called laughing gas. ?What kinds of medicine are available for pain relief? ?There are two kinds of medicines that can be used to relieve pain during labor and delivery: ?Analgesics. These medicines decrease pain without causing you to lose feeling or the ability to move your muscles. ?Anesthetics. These medicines block feeling in the body and can decrease your ability to move freely. ?Both kinds of medicine can cause minor side effects, such as nausea, trouble concentrating, and sleepiness. They can also affect the baby's heart rate before birth and his or her breathing after birth. For this reason, health care providers are careful about when and how much medicine is given. ?Which medicines are used to provide pain relief? ?Common medicines ?The most common medicines used to help manage pain during labor and delivery include: ?Opioids. Opioids are medicines that decrease how much pain you feel (perception of pain). These medicines can be given through an IV or may be used with anesthetics to block pain. ?Epidural analgesia. ?Epidural analgesia is given through a very thin tube that is inserted into the lower back. Medicine is delivered continuously to the area near your spinal column nerves (epidural space). After having this treatment, you may be able to move your legs, but you will not be able to walk. Depending on the amount and type of medicine given, you may lose all feeling in the lower half of your body, or you  may have some sensation, including the urge to push. This treatment can be used to give pain relief for a vaginal birth. ?Sometimes, a numbing medicine is injected into the spinal fluid when an epidural catheter is placed. This provides for immediate relief but only lasts for 1-2 hours. Once it wears off, the epidural will provide pain  relief. This is called a combined spinal-epidural (CSE) block. ?Intrathecal analgesia (spinal analgesia). Intrathecal analgesia is similar to epidural analgesia, but the medicine is injected into the spinal flui

## 2021-07-25 NOTE — Progress Notes (Signed)
? ? ?  Routine Prenatal Care Visit ? ?Subjective  ?Robin Campbell is a 21 y.o. G1P0 at [redacted]w[redacted]d being seen today for ongoing prenatal care.  She is currently monitored for the following issues for this low-risk pregnancy and has Encounter for supervision of normal first pregnancy in third trimester; Hyperemesis affecting pregnancy, antepartum; Hyperemesis gravidarum; Dehydration; Hypokalemia; Ketonuria; Transaminitis; and Asymptomatic bacteriuria during pregnancy on their problem list.  ?----------------------------------------------------------------------------------- ?Patient reports no complaints.   ?Contractions: Not present. Vag. Bleeding: None.  Movement: Present. Denies leaking of fluid.  ?----------------------------------------------------------------------------------- ?The following portions of the patient's history were reviewed and updated as appropriate: allergies, current medications, past family history, past medical history, past social history, past surgical history and problem list. Problem list updated. ? ? ?Objective  ?Blood pressure 110/70, weight 165 lb (74.8 kg), last menstrual period 10/15/2020. ?Pregravid weight 147 lb (66.7 kg) Total Weight Gain 18 lb (8.165 kg) ?Urinalysis:     ? ?Fetal Status: Fetal Heart Rate (bpm): 150   Movement: Present    ? ?General:  Alert, oriented and cooperative. Patient is in no acute distress.  ?Skin: Skin is warm and dry. No rash noted.   ?Cardiovascular: Normal heart rate noted  ?Respiratory: Normal respiratory effort, no problems with respiration noted  ?Abdomen: Soft, gravid, appropriate for gestational age. Pain/Pressure: Absent     ?Pelvic:   Cervical exam declined         ?Extremities: Normal range of motion.  Edema: None  ?Mental Status: Normal mood and affect. Normal behavior. Normal judgment and thought content.  ? ? ? ?Assessment  ? ?21 y.o. G1P0 at [redacted]w[redacted]d by  07/22/2021, by Last Menstrual Period presenting for routine prenatal visit ? ?Plan   ? ?pregnancy 1 Problems (from 12/11/20 to present)   ? ? Problem Noted Resolved  ? Encounter for supervision of normal first pregnancy in third trimester 12/11/2020 by Tresea Mall, CNM No  ? Overview Addendum 07/25/2021  2:31 PM by Natale Milch, MD  ?   ?Nursing Staff Provider  ?Office Location  [facility] Dating  LMP, confirmed 9 wk [place]  ?Language  Ambulance person [place]  Norm, norm fu  ?Flu Vaccine   Genetic Screen  NIPS: normal xy  ?TDaP vaccine   06/09/21 Hgb A1C or  ?GTT Early : NA ?Third trimester : 81  ?Covid    LAB RESULTS   ?Rhogam  Not needed Blood Type O/Positive/-- (09/07 1047)   ?Feeding Plan Breast Antibody Negative (09/07 1047)  ?Contraception Undecided  Rubella 3.95 (09/07 1047)  ?Circumcision yes RPR Non Reactive (09/07 1047)   ?Pediatrician   HBsAg Negative (09/07 1047)   ?Support Person Boyfriend [place] HIV Non Reactive (09/07 1047)  ?Prenatal Classes Advised Varicella Immune  ?  GBS negative   ?BTL Consent NO    ?VBAC Consent N/A Pap  Not of age  ?     ?Pelvis Tested NA    ? ? ?  ?  ? ?  ?  ?NST: 150 bpm baseline, moderate variability, 15x15 accelerations, no decelerations. ?Reviewed induction and pain control options ? ?Gestational age appropriate obstetric precautions including but not limited to vaginal bleeding, contractions, leaking of fluid and fetal movement were reviewed in detail with the patient.   ? ?Return if symptoms worsen or fail to improve. ? ?Natale Milch MD ?[facility], [facility] ?07/25/2021, 2:32 PM ? ? ?

## 2021-07-27 ENCOUNTER — Other Ambulatory Visit: Payer: Self-pay

## 2021-07-27 ENCOUNTER — Inpatient Hospital Stay: Payer: Medicaid Other | Admitting: Anesthesiology

## 2021-07-27 ENCOUNTER — Encounter: Payer: Self-pay | Admitting: Obstetrics and Gynecology

## 2021-07-27 ENCOUNTER — Inpatient Hospital Stay
Admission: EM | Admit: 2021-07-27 | Discharge: 2021-07-29 | DRG: 807 | Disposition: A | Discharge status: Home / Self Care | Payer: Medicaid Other | Attending: Obstetrics and Gynecology | Admitting: Obstetrics and Gynecology

## 2021-07-27 DIAGNOSIS — O4292 Full-term premature rupture of membranes, unspecified as to length of time between rupture and onset of labor: Secondary | ICD-10-CM | POA: Diagnosis not present

## 2021-07-27 DIAGNOSIS — O480 Post-term pregnancy: Secondary | ICD-10-CM | POA: Diagnosis not present

## 2021-07-27 DIAGNOSIS — O210 Mild hyperemesis gravidarum: Secondary | ICD-10-CM | POA: Diagnosis not present

## 2021-07-27 DIAGNOSIS — O4202 Full-term premature rupture of membranes, onset of labor within 24 hours of rupture: Secondary | ICD-10-CM | POA: Diagnosis not present

## 2021-07-27 DIAGNOSIS — Z3A40 40 weeks gestation of pregnancy: Secondary | ICD-10-CM | POA: Diagnosis not present

## 2021-07-27 DIAGNOSIS — Z3403 Encounter for supervision of normal first pregnancy, third trimester: Principal | ICD-10-CM

## 2021-07-27 DIAGNOSIS — O4290 Premature rupture of membranes, unspecified as to length of time between rupture and onset of labor, unspecified weeks of gestation: Secondary | ICD-10-CM | POA: Diagnosis present

## 2021-07-27 HISTORY — DX: Other specified health status: Z78.9

## 2021-07-27 LAB — CBC
HCT: 30.7 % — ABNORMAL LOW (ref 36.0–46.0)
Hemoglobin: 10 g/dL — ABNORMAL LOW (ref 12.0–15.0)
MCH: 27.9 pg (ref 26.0–34.0)
MCHC: 32.6 g/dL (ref 30.0–36.0)
MCV: 85.8 fL (ref 80.0–100.0)
Platelets: 326 10*3/uL (ref 150–400)
RBC: 3.58 MIL/uL — ABNORMAL LOW (ref 3.87–5.11)
RDW: 13.3 % (ref 11.5–15.5)
WBC: 13 10*3/uL — ABNORMAL HIGH (ref 4.0–10.5)
nRBC: 0 % (ref 0.0–0.2)

## 2021-07-27 LAB — RPR: RPR Ser Ql: NONREACTIVE

## 2021-07-27 LAB — TYPE AND SCREEN
ABO/RH(D): O POS
Antibody Screen: NEGATIVE

## 2021-07-27 LAB — ABO/RH: ABO/RH(D): O POS

## 2021-07-27 MED ORDER — WITCH HAZEL-GLYCERIN EX PADS
1.0000 "application " | MEDICATED_PAD | CUTANEOUS | Status: DC | PRN
  Administered 2021-07-29: 1 via TOPICAL
  Filled 2021-07-27 (×2): qty 100

## 2021-07-27 MED ORDER — OXYTOCIN-SODIUM CHLORIDE 30-0.9 UT/500ML-% IV SOLN
1.0000 m[IU]/min | INTRAVENOUS | Status: DC
  Administered 2021-07-27: 2 m[IU]/min via INTRAVENOUS

## 2021-07-27 MED ORDER — ACETAMINOPHEN 325 MG PO TABS
650.0000 mg | ORAL_TABLET | ORAL | Status: DC | PRN
  Filled 2021-07-27 (×3): qty 2

## 2021-07-27 MED ORDER — LACTATED RINGERS IV SOLN: 500.0000 mL | INTRAVENOUS | Status: DC | PRN

## 2021-07-27 MED ORDER — TERBUTALINE SULFATE 1 MG/ML IJ SOLN: 0.2500 mg | Freq: Once | INTRAMUSCULAR | Status: DC | PRN

## 2021-07-27 MED ORDER — LIDOCAINE HCL (PF) 1 % IJ SOLN
30.0000 mL | INTRAMUSCULAR | Status: DC | PRN
  Filled 2021-07-27: qty 30

## 2021-07-27 MED ORDER — COCONUT OIL OIL
1.0000 | TOPICAL_OIL | Status: DC | PRN
Start: 2021-07-27 — End: 2021-07-29
  Administered 2021-07-27 – 2021-07-29 (×2): 1 via TOPICAL
  Filled 2021-07-27 (×2): qty 120

## 2021-07-27 MED ORDER — IBUPROFEN 600 MG PO TABS
600.0000 mg | ORAL_TABLET | Freq: Four times a day (QID) | ORAL | Status: DC
  Administered 2021-07-28 – 2021-07-29 (×4): 600 mg via ORAL
  Filled 2021-07-27 (×7): qty 1

## 2021-07-27 MED ORDER — PHENYLEPHRINE 40 MCG/ML (10ML) SYRINGE FOR IV PUSH (FOR BLOOD PRESSURE SUPPORT)
80.0000 ug | PREFILLED_SYRINGE | INTRAVENOUS | Status: DC | PRN
  Filled 2021-07-27: qty 10

## 2021-07-27 MED ORDER — MISOPROSTOL 200 MCG PO TABS
ORAL_TABLET | ORAL | Status: AC
  Filled 2021-07-27: qty 4

## 2021-07-27 MED ORDER — BUTORPHANOL TARTRATE 1 MG/ML IJ SOLN: 1.0000 mg | INTRAMUSCULAR | Status: DC | PRN

## 2021-07-27 MED ORDER — DIBUCAINE (PERIANAL) 1 % EX OINT: 1.0000 "application " | TOPICAL_OINTMENT | CUTANEOUS | Status: DC | PRN

## 2021-07-27 MED ORDER — OXYTOCIN 10 UNIT/ML IJ SOLN
INTRAMUSCULAR | Status: AC
  Filled 2021-07-27: qty 2

## 2021-07-27 MED ORDER — LIDOCAINE HCL (PF) 1 % IJ SOLN
INTRAMUSCULAR | Status: DC | PRN
Start: 2021-07-27 — End: 2021-07-27
  Administered 2021-07-27: 3 mL via SUBCUTANEOUS

## 2021-07-27 MED ORDER — OXYTOCIN-SODIUM CHLORIDE 30-0.9 UT/500ML-% IV SOLN
INTRAVENOUS | Status: AC
  Filled 2021-07-27: qty 500

## 2021-07-27 MED ORDER — LACTATED RINGERS IV SOLN
500.0000 mL | Freq: Once | INTRAVENOUS | Status: AC
  Administered 2021-07-27: 500 mL via INTRAVENOUS

## 2021-07-27 MED ORDER — OXYTOCIN-SODIUM CHLORIDE 30-0.9 UT/500ML-% IV SOLN: 2.5000 [IU]/h | INTRAVENOUS | Status: DC

## 2021-07-27 MED ORDER — EPHEDRINE 5 MG/ML INJ
10.0000 mg | INTRAVENOUS | Status: DC | PRN
  Filled 2021-07-27: qty 2

## 2021-07-27 MED ORDER — AMMONIA AROMATIC IN INHA
RESPIRATORY_TRACT | Status: AC
  Filled 2021-07-27: qty 10

## 2021-07-27 MED ORDER — SIMETHICONE 80 MG PO CHEW
80.0000 mg | CHEWABLE_TABLET | ORAL | Status: DC | PRN
Start: 2021-07-27 — End: 2021-07-29

## 2021-07-27 MED ORDER — LIDOCAINE HCL (PF) 1 % IJ SOLN
INTRAMUSCULAR | Status: AC
  Filled 2021-07-27: qty 30

## 2021-07-27 MED ORDER — OXYTOCIN BOLUS FROM INFUSION
333.0000 mL | Freq: Once | INTRAVENOUS | Status: AC
  Administered 2021-07-27: 333 mL via INTRAVENOUS

## 2021-07-27 MED ORDER — ONDANSETRON HCL 4 MG/2ML IJ SOLN: 4.0000 mg | INTRAMUSCULAR | Status: DC | PRN

## 2021-07-27 MED ORDER — DIPHENHYDRAMINE HCL 25 MG PO CAPS: 25.0000 mg | ORAL_CAPSULE | Freq: Four times a day (QID) | ORAL | Status: DC | PRN

## 2021-07-27 MED ORDER — BENZOCAINE-MENTHOL 20-0.5 % EX AERO
1.0000 "application " | INHALATION_SPRAY | CUTANEOUS | Status: DC | PRN
  Administered 2021-07-29: 1 via TOPICAL
  Filled 2021-07-27 (×2): qty 56

## 2021-07-27 MED ORDER — FENTANYL-BUPIVACAINE-NACL 0.5-0.125-0.9 MG/250ML-% EP SOLN
12.0000 mL/h | EPIDURAL | Status: DC | PRN
  Administered 2021-07-27: 12 mL/h via EPIDURAL
  Filled 2021-07-27: qty 250

## 2021-07-27 MED ORDER — PRENATAL MULTIVITAMIN CH
1.0000 | ORAL_TABLET | Freq: Every day | ORAL | Status: DC
  Administered 2021-07-28: 1 via ORAL
  Filled 2021-07-27: qty 1

## 2021-07-27 MED ORDER — LACTATED RINGERS IV SOLN: INTRAVENOUS | Status: DC

## 2021-07-27 MED ORDER — ONDANSETRON HCL 4 MG PO TABS: 4.0000 mg | ORAL_TABLET | ORAL | Status: DC | PRN

## 2021-07-27 MED ORDER — TETANUS-DIPHTH-ACELL PERTUSSIS 5-2.5-18.5 LF-MCG/0.5 IM SUSY
0.5000 mL | PREFILLED_SYRINGE | Freq: Once | INTRAMUSCULAR | Status: DC
  Filled 2021-07-27: qty 0.5

## 2021-07-27 MED ORDER — LIDOCAINE-EPINEPHRINE (PF) 1.5 %-1:200000 IJ SOLN
INTRAMUSCULAR | Status: DC | PRN
  Administered 2021-07-27: 3 mL via EPIDURAL

## 2021-07-27 MED ORDER — SENNOSIDES-DOCUSATE SODIUM 8.6-50 MG PO TABS
2.0000 | ORAL_TABLET | Freq: Every day | ORAL | Status: DC
  Administered 2021-07-28: 2 via ORAL
  Filled 2021-07-27: qty 2

## 2021-07-27 MED ORDER — BUPIVACAINE HCL (PF) 0.25 % IJ SOLN
INTRAMUSCULAR | Status: DC | PRN
  Administered 2021-07-27 (×2): 5 mL via EPIDURAL

## 2021-07-27 MED ORDER — DIPHENHYDRAMINE HCL 50 MG/ML IJ SOLN: 12.5000 mg | INTRAMUSCULAR | Status: DC | PRN

## 2021-07-27 MED ORDER — ONDANSETRON HCL 4 MG/2ML IJ SOLN: 4.0000 mg | Freq: Four times a day (QID) | INTRAMUSCULAR | Status: DC | PRN

## 2021-07-27 MED ORDER — ACETAMINOPHEN 325 MG PO TABS
650.0000 mg | ORAL_TABLET | ORAL | Status: DC | PRN
  Administered 2021-07-27 – 2021-07-28 (×3): 650 mg via ORAL

## 2021-07-27 NOTE — Anesthesia Procedure Notes (Signed)
Epidural ?Patient location during procedure: OB ?Start time: 07/27/2021 6:18 AM ?End time: 07/27/2021 6:21 AM ? ?Staffing ?Anesthesiologist: Lenard Simmer, MD ?Performed: anesthesiologist  ? ?Preanesthetic Checklist ?Completed: patient identified, IV checked, site marked, risks and benefits discussed, surgical consent, monitors and equipment checked, pre-op evaluation and timeout performed ? ?Epidural ?Patient position: sitting ?Prep: ChloraPrep ?Patient monitoring: heart rate, continuous pulse ox and blood pressure ?Approach: midline ?Location: L3-L4 ?Injection technique: LOR saline ? ?Needle:  ?Needle type: Tuohy  ?Needle gauge: 17 G ?Needle length: 9 cm and 9 ?Needle insertion depth: 4 cm ?Catheter type: closed end flexible ?Catheter size: 19 Gauge ?Catheter at skin depth: 9 cm ?Test dose: negative and 1.5% lidocaine with Epi 1:200 K ? ?Assessment ?Sensory level: T10 ?Events: blood not aspirated, injection not painful, no injection resistance, no paresthesia and negative IV test ? ?Additional Notes ?1st attempt ?Pt. Evaluated and documentation done after procedure finished. ?Patient identified. Risks/Benefits/Options discussed with patient including but not limited to bleeding, infection, nerve damage, paralysis, failed block, incomplete pain control, headache, blood pressure changes, nausea, vomiting, reactions to medication both or allergic, itching and postpartum back pain. Confirmed with bedside nurse the patient's most recent platelet count. Confirmed with patient that they are not currently taking any anticoagulation, have any bleeding history or any family history of bleeding disorders. Patient expressed understanding and wished to proceed. All questions were answered. Sterile technique was used throughout the entire procedure. Please see nursing notes for vital signs. Test dose was given through epidural catheter and negative prior to continuing to dose epidural or start infusion. Warning signs of high  block given to the patient including shortness of breath, tingling/numbness in hands, complete motor block, or any concerning symptoms with instructions to call for help. Patient was given instructions on fall risk and not to get out of bed. All questions and concerns addressed with instructions to call with any issues or inadequate analgesia.   ? ?Patient tolerated the insertion well without immediate complications.Reason for block:procedure for pain ? ? ? ?

## 2021-07-27 NOTE — Discharge Summary (Signed)
?OB Discharge Summary  ?   ?Patient Name: Robin Campbell ?DOB: [birth year] ?MRN: [id number] ? ?Date of admission: 07/27/2021 ?Delivering provider: Tresea Mall, CNM  ?Date of Delivery: 07/27/2021  ?Date of discharge: 07/29/2021 ? ?Admitting diagnosis: Amniotic fluid leaking [O42.90] ?Intrauterine pregnancy: [redacted]w[redacted]d     ?Secondary diagnosis: None ?    ?Discharge diagnosis: Term Pregnancy Delivered                                  ?                                                              ?Post partum procedures: NA ? ?Augmentation: Pitocin ? ?Complications: None ? ?Hospital course:  Onset of Labor With Vaginal Delivery      ?21 y.o. yo G1P0 at [redacted]w[redacted]d was admitted in Latent Labor on 07/27/2021. Patient had an uncomplicated labor course as follows:  ?Membrane Rupture Time/Date: 1:45 AM ,07/27/2021   ?Delivery Method:Vaginal, Spontaneous  ?Episiotomy: None  ?Lacerations:  None  ?See delivery note for details ? ?Patient had an uncomplicated postpartum course.  She is tolerating regular diet, her pain is controlled with PO medication, she is ambulating and voiding without difficulty. Breastfeeding independently.  Ready to go home. Her partner and his parents will be available for support.  ? ?Patient is discharged home in stable condition on 07/29/21. ? ?Newborn Data: ?Birth date:07/27/2021  ?Birth time:12:35 PM  ?Gender:Female  [place] ?Living status:Living  ?Apgars:8 ,9  ?Weight:3200 g  ? ?Physical exam  ?Vitals:  ? 07/28/21 0819 07/28/21 1631 07/28/21 2220 07/29/21 0820  ?BP: 120/73 108/64 111/73 112/76  ?Pulse: 77 83 87 92  ?Resp: 20 18 18 18   ?Temp: 97.8 ?F (36.6 ?C) 98.3 ?F (36.8 ?C) 98.6 ?F (37 ?C) 98.3 ?F (36.8 ?C)  ?TempSrc: Oral Oral Oral Oral  ?SpO2: 99% 99% 98% 99%  ?Weight:      ?Height:      ? ?General: alert tired appearing  ?Breasts: soft, no redness or masses, nipples erect and intact bilaterally  ?Lochia: appropriate ?Uterine Fundus: firm ?Perineum: slightly swollen ?Incision: N/A ?Extremities: trace BLE  ?DVT  Evaluation: No evidence of DVT seen on physical exam. ? ?Labs: ?Lab Results  ?Component Value Date  ? WBC 15.5 (H) 07/28/2021  ? HGB 9.5 (L) 07/28/2021  ? HCT 30.1 (L) 07/28/2021  ? MCV 86.5 07/28/2021  ? PLT 277 07/28/2021  ? ? ?Discharge instruction: per After Visit Summary. ? ?Medications:  ?Allergies as of 07/29/2021   ?No Known Allergies ?  ? ?  ?Medication List  ?  ? ?TAKE these medications   ? ?acetaminophen 325 MG tablet ?Commonly known as: TYLENOL ?Take 2 tablets (650 mg total) by mouth every 4 (four) hours as needed (for pain scale < 4  OR  temperature  >/=  100.5 F). ?  ?acetaminophen 325 MG tablet ?Commonly known as: Tylenol ?Take 2 tablets (650 mg total) by mouth every 4 (four) hours as needed (for pain scale < 4). ?  ?coconut oil Oil ?Apply 1 application. topically as needed. ?  ?dibucaine 1 % Oint ?Commonly known as: NUPERCAINAL ?Place 1 application. rectally as needed for hemorrhoids. ?  ?ibuprofen 600 MG tablet ?Commonly known as:  ADVIL ?Take 1 tablet (600 mg total) by mouth every 6 (six) hours. ?  ?prenatal multivitamin Tabs tablet ?Take 1 tablet by mouth daily at 12 noon. ?  ?simethicone 80 MG chewable tablet ?Commonly known as: MYLICON ?Chew 1 tablet (80 mg total) by mouth as needed for flatulence. ?  ?witch hazel-glycerin pad ?Commonly known as: TUCKS ?Apply 1 application. topically as needed for hemorrhoids. ?  ? ?  ? ?  ?  ? ? ?  ?Discharge Care Instructions  ?(From admission, onward)  ?  ? ? ?  ? ?  Start     Ordered  ? 07/29/21 0000  Discharge wound care:       ?Comments: SHOWER DAILY ?Wash incision gently with soap and water.  ?Call office with any drainage, redness, or firmness of the incision.  ? 07/29/21 0847  ? ?  ?  ? ?  ? ? ?Diet: routine diet ? ?Activity: Advance as tolerated. Pelvic rest for 6 weeks.  ? ?Outpatient follow up: ? Follow-up Information   ? ? Tresea Mall, CNM. Schedule an appointment as soon as possible for a visit.   ?Specialty: Obstetrics ?Why: 2 weeks and 6 weeks  postpartum ?Contact information: ?[address] ?[place] [address] ?[phone] ? ? ?  ?  ? ?  ?  ? ?  ?   ? ?Postpartum contraception: Undecided  ?Rhogam Given postpartum: Rh positive ?Rubella vaccine given postpartum: immune ?Varicella vaccine given postpartum: immune ?TDaP given antepartum or postpartum: given antepartum ? ? ? ?Newborn Delivery   ?Birth date/time: 07/27/2021 12:35:00 ?Delivery type: Vaginal, Spontaneous ?  ?  ? ? ?Baby Feeding: Breast ? ?Disposition:home with mother ? ?SIGNED: ? ?Angela Burke, CNM ?07/29/2021 8:54 AM   ?

## 2021-07-27 NOTE — H&P (Signed)
OB History & Physical  ? ?History of Present Illness:  ?Chief Complaint: leaking fluid ? ?HPI:  ?Robin Campbell is a 21 y.o. G1P0 female at [redacted]w[redacted]d dated by LMP c/w 9 week u/s.  Her pregnancy has been complicated by hyperemesis .   ? ?She denies contractions.   She reports leakage of fluid at 1:45 this morning.   She denies vaginal bleeding.   She reports fetal movement.   ? ?Total weight gain for pregnancy: 8.165 kg  ? ?Obstetrical Problem List: ?pregnancy 1 Problems (from 12/11/20 to present)   ? ? Problem Noted Resolved  ? Encounter for supervision of normal first pregnancy in third trimester 12/11/2020 by Tresea Mall, CNM No  ? Overview Addendum 07/25/2021  2:31 PM by Natale Milch, MD  ?   ?Nursing Staff Provider  ?Office Location  [facility] Dating  LMP, confirmed 9 wk [place]  ?Language  Ambulance person [place]  Norm, norm fu  ?Flu Vaccine   Genetic Screen  NIPS: normal xy  ?TDaP vaccine   06/09/21 Hgb A1C or  ?GTT Early : NA ?Third trimester : 34  ?Covid    LAB RESULTS   ?Rhogam  Not needed Blood Type O/Positive/-- (09/07 1047)   ?Feeding Plan Breast Antibody Negative (09/07 1047)  ?Contraception Undecided  Rubella 3.95 (09/07 1047)  ?Circumcision yes RPR Non Reactive (09/07 1047)   ?Pediatrician   HBsAg Negative (09/07 1047)   ?Support Person Boyfriend [place] HIV Non Reactive (09/07 1047)  ?Prenatal Classes Advised Varicella Immune  ?  GBS negative   ?BTL Consent NO    ?VBAC Consent N/A Pap  Not of age  ?     ?Pelvis Tested NA    ? ? ?  ?  ? ?  ?  ? ?Maternal Medical History:  ? ?Past Medical History:  ?Diagnosis Date  ? Medical history non-contributory   ? ? ?Past Surgical History:  ?Procedure Laterality Date  ? NO PAST SURGERIES    ? ? ?No Known Allergies ? ?Prior to Admission medications   ?Not on File  ? ? ?OB History  ?Gravida Para Term Preterm AB Living  ?1            ?SAB IAB Ectopic Multiple Live Births  ?           ?  ?# Outcome Date GA Lbr Len/2nd Weight Sex Delivery Anes PTL Lv  ?1 Current            ? ? ?Prenatal care site: [facility] ? ?Social History: She  reports that she has never smoked. She has never been exposed to tobacco smoke. She has never used smokeless tobacco. She reports that she does not currently use alcohol. She reports that she does not use drugs. ? ?Family History: family history includes Colon cancer in her father; Throat cancer in her father.  ? ? ?Review of Systems:  ?Review of Systems  ?Constitutional:  Negative for chills and fever.  ?HENT:  Negative for congestion, ear discharge, ear pain, hearing loss, sinus pain and sore throat.   ?Eyes:  Negative for blurred vision and double vision.  ?Respiratory:  Negative for cough, shortness of breath and wheezing.   ?Cardiovascular:  Negative for chest pain, palpitations and leg swelling.  ?Gastrointestinal:  Negative for abdominal pain, blood in stool, constipation, diarrhea, heartburn, melena, nausea and vomiting.  ?Genitourinary:  Negative for dysuria, flank pain, frequency, hematuria and urgency.  ?Musculoskeletal:  Negative for back pain, joint pain and myalgias.  ?  Skin:  Negative for itching and rash.  ?Neurological:  Negative for dizziness, tingling, tremors, sensory change, speech change, focal weakness, seizures, loss of consciousness, weakness and headaches.  ?Endo/Heme/Allergies:  Negative for environmental allergies. Does not bruise/bleed easily.  ?Psychiatric/Behavioral:  Negative for depression, hallucinations, memory loss, substance abuse and suicidal ideas. The patient is not nervous/anxious and does not have insomnia.    ? ?Physical Exam:  ?Temp 97.8 ?F (36.6 ?C) (Oral)   Resp 18   Ht 5\' 3"  (1.6 m)   Wt 74.8 kg   LMP 10/15/2020 (Exact Date)   BMI 29.23 kg/m?   ?Constitutional: Well nourished, well developed female in no acute distress.  ?HEENT: normal ?Skin: Warm and dry.  ?Cardiovascular: Regular rate and rhythm.   ?Extremity:  no edema   ?Respiratory: Clear to auscultation bilateral. Normal respiratory  effort ?Abdomen: FHT present ?Back: no CVAT ?Neuro: DTRs 2+, Cranial nerves grossly intact ?Psych: Alert and Oriented x3. No memory deficits. Normal mood and affect.  ?MS: normal gait, normal bilateral lower extremity ROM/strength/stability. ? ?Pelvic exam: per RN J. Guptill ?3/70/-2 ? ? ?Baseline FHR: 135 beats/min   Variability: moderate   Accelerations: present   Decelerations: absent ?Contractions: present frequency: irregular ?Overall assessment: reassuring ? ? ?Lab Results  ?Component Value Date  ? SARSCOV2NAA NEGATIVE 01/11/2021  ? ? ?Assessment:  ?Robin Campbell is a 21 y.o. G1P0 female at [redacted]w[redacted]d with PROM, early labor.  ? ?Plan:  ?Admit to Labor & Delivery  ?CBC, T&S, Clrs, IVF ?GBS negative.   ?Fetal well-being: Category I ?Start pitocin as needed ?  ? ?[redacted]w[redacted]d, CNM ?07/27/2021 3:16 AM  ?  ?

## 2021-07-27 NOTE — OB Triage Note (Signed)
Patient is a G1P0 at 40 weeks 5 days presenting to Labor and Delivery for leaking of fluid since 0145. She has saturated a pad with the fluid. Patient denies vaginal bleeding and endorses positive FM. Pt states she has not felt contractions. VSS. Monitors applied and assessing. [place] CNM aware of pt's arrival.  ?

## 2021-07-27 NOTE — Plan of Care (Signed)
?  Problem: Activity: ?Goal: Ability to tolerate increased activity will improve ?Outcome: Progressing ?  ?Problem: Activity: ?Goal: Will verbalize the importance of balancing activity with adequate rest periods ?Outcome: Progressing ?  ?Problem: Coping: ?Goal: Ability to identify and utilize available resources and services will improve ?Outcome: Progressing ?  ?Problem: Life Cycle: ?Goal: Chance of risk for complications during the postpartum period will decrease ?Outcome: Progressing ?  ?Problem: Role Relationship: ?Goal: Ability to demonstrate positive interaction with newborn will improve ?Outcome: Progressing ?  ?

## 2021-07-27 NOTE — Lactation Note (Signed)
This note was copied from a baby's chart. Lactation Consultation Note  Patient Name: Robin Campbell  [id number] Date: 07/27/2021 Reason for consult: Initial assessment Age:20 hours  Maternal Data Has patient been taught Hand Expression?: Yes Does the patient have breastfeeding experience prior to this delivery?: No  Feeding Mother's Current Feeding Choice: Breast Milk  LATCH Score  Feeding not observed - had just fed ~ 30 minutes per mother report on Left side with no concerns/discomfort reported.   Lactation Tools Discussed/Used  Discussed positioning options and support. Discussed hand expression and spoon feeding as needed, given.   Interventions Interventions: Breast feeding basics reviewed;Skin to skin;Hand express;Support pillows;Education Cluster feeding discussed in anticipation.  Discharge    Consult Status Consult Status: Follow-up from L&D Follow-up type: In-patient  Lactation to the room for initial visit. Father  is holding the baby. Encouraged feeding on demand and with cues (reviewed what those are). If baby is not cueing encouraged hand expression and skin to skin. Reviewed proper technique for hand expression and spoon feeding.  Encouraged 8 or more attempts in the first 24 hours and 8 or more good feeds after 24 HOL. Reviewed appropriate diapers for days of life and how to know your baby is getting enough to eat. Recommend reviewing "Understanding Postpartum and Newborn Care" booklet at bedside prior to discharge. [facility] # left on board, encouraged to call for any assistance. Reviewed positioning and signs of good latch. Recommend latch check, signs of milk transfer, and hand expression demonstration prior to discharge. Mother has no further questions at this time.  Mother reports that she will not be separated by work or school.  No pumping needs at this time - can revisit this prior to discharge as needed. Also need to verify PCP for outpatient [facility] referral as  needed.   Dionne Milo A Hillary Schwegler 07/27/2021, 3:04 PM

## 2021-07-28 LAB — CBC
HCT: 30.1 % — ABNORMAL LOW (ref 36.0–46.0)
Hemoglobin: 9.5 g/dL — ABNORMAL LOW (ref 12.0–15.0)
MCH: 27.3 pg (ref 26.0–34.0)
MCHC: 31.6 g/dL (ref 30.0–36.0)
MCV: 86.5 fL (ref 80.0–100.0)
Platelets: 277 10*3/uL (ref 150–400)
RBC: 3.48 MIL/uL — ABNORMAL LOW (ref 3.87–5.11)
RDW: 13.8 % (ref 11.5–15.5)
WBC: 15.5 10*3/uL — ABNORMAL HIGH (ref 4.0–10.5)
nRBC: 0 % (ref 0.0–0.2)

## 2021-07-28 NOTE — Progress Notes (Signed)
Post Partum Day 1 ?Subjective: ?no complaints, up ad lib, voiding, tolerating PO, and her perineum is intact. She has occasional cramping when she puts the baby to breast.Some breastfeeding challenges. Is planning on having additional assistance  with lactation today. ? ?Objective: ?Blood pressure 120/73, pulse 77, temperature 97.8 ?F (36.6 ?C), temperature source Oral, resp. rate 20, height 5\' 3"  (1.6 m), weight 74.8 kg, last menstrual period 10/15/2020, SpO2 99 %. ? ?Physical Exam:  ?General: cooperative, fatigued, and no distress ?Lochia: appropriate ?Uterine Fundus: firm ?Incision: healing well, no perineal edema ?DVT Evaluation: No evidence of DVT seen on physical exam. ?Negative Homan's sign. ? ?Recent Labs  ?  07/27/21 ?0402 07/28/21 ?0730  ?HGB 10.0* 9.5*  ?HCT 30.7* 30.1*  ? ? ?Assessment/Plan: ?Plan for discharge tomorrow, Lactation consult, and Contraception is undecided. ? ? LOS: 1 day  ? ?07/30/21 ?07/28/2021, 8:53 AM  ? ? ?

## 2021-07-28 NOTE — Progress Notes (Signed)
Nutrition Brief Note ? ?Patient identified on the Malnutrition Screening Tool (MST) Report ? ?Wt Readings from Last 15 Encounters:  ?07/27/21 74.8 kg (90 %, Z= 1.25)*  ?07/25/21 74.8 kg (90 %, Z= 1.25)*  ?07/22/21 74.8 kg (90 %, Z= 1.26)*  ?07/15/21 73.9 kg (89 %, Z= 1.21)*  ?07/01/21 72.9 kg (87 %, Z= 1.15)*  ?06/23/21 71.7 kg (86 %, Z= 1.08)*  ?06/09/21 70.8 kg (85 %, Z= 1.02)*  ?05/23/21 70.3 kg (84 %, Z= 1.00)*  ?05/09/21 68.9 kg (82 %, Z= 0.91)*  ?04/11/21 66.7 kg (78 %, Z= 0.76)*  ?03/14/21 63 kg (68 %, Z= 0.48)*  ?02/06/21 61.2 kg (63 %, Z= 0.33)*  ?01/22/21 58.8 kg (54 %, Z= 0.10)*  ?01/15/21 57.4 kg (48 %, Z= -0.04)*  ?01/08/21 55.8 kg (41 %, Z= -0.22)*  ? ?* Growth percentiles are based on CDC (Girls, 2-20 Years) data.  ? ?Robin Campbell is a 21 y.o. G1P0 female at [redacted]w[redacted]d dated by LMP c/w 9 week u/s.  Her pregnancy has been complicated by hyperemesis .   ? ?Pt admitted with leaking amniotic fluid.  ? ?3/26- s/p vaginal birth ? ?Per MD notes, plan for lactation consultant consult with plan to discharge home tomorrow.  ? ?Medications reviewed.  ? ?Labs reviewed.  ? ?Current diet order is regular, patient is consuming approximately n/a% of meals at this time. Labs and medications reviewed.  ? ?No nutrition interventions warranted at this time. If nutrition issues arise, please consult RD.  ? ?Levada Schilling, RD, LDN, CDCES ?Registered Dietitian II ?Certified Diabetes Care and Education Specialist ?Please refer to [facility] for RD and/or RD on-call/weekend/after hours pager   ?

## 2021-07-28 NOTE — Anesthesia Preprocedure Evaluation (Signed)
Anesthesia Evaluation  Patient identified by MRN, date of birth, ID band Patient awake    Reviewed: Allergy & Precautions, H&P , NPO status , Patient's Chart, lab work & pertinent test results, reviewed documented beta blocker date and time   History of Anesthesia Complications Negative for: history of anesthetic complications  Airway Mallampati: II  TM Distance: >3 FB Neck ROM: full    Dental no notable dental hx.    Pulmonary neg pulmonary ROS,    Pulmonary exam normal breath sounds clear to auscultation       Cardiovascular Exercise Tolerance: Good negative cardio ROS Normal cardiovascular exam Rhythm:regular Rate:Normal     Neuro/Psych negative neurological ROS  negative psych ROS   GI/Hepatic Neg liver ROS, GERD  ,  Endo/Other  negative endocrine ROS  Renal/GU negative Renal ROS  negative genitourinary   Musculoskeletal   Abdominal   Peds  Hematology negative hematology ROS (+)   Anesthesia Other Findings Past Medical History: No date: Medical history non-contributory   Reproductive/Obstetrics (+) Pregnancy                             Anesthesia Physical Anesthesia Plan  ASA: 2  Anesthesia Plan: Epidural   Post-op Pain Management:    Induction:   PONV Risk Score and Plan:   Airway Management Planned:   Additional Equipment:   Intra-op Plan:   Post-operative Plan:   Informed Consent: I have reviewed the patients History and Physical, chart, labs and discussed the procedure including the risks, benefits and alternatives for the proposed anesthesia with the patient or authorized representative who has indicated his/her understanding and acceptance.     Dental Advisory Given  Plan Discussed with: Anesthesiologist, CRNA and Surgeon  Anesthesia Plan Comments:         Anesthesia Quick Evaluation  

## 2021-07-28 NOTE — Anesthesia Postprocedure Evaluation (Signed)
Anesthesia Post Note ? ?Patient: Keelin Neville ? ?Procedure(s) Performed: AN AD HOC LABOR EPIDURAL ? ?Patient location during evaluation: Mother Baby ?Anesthesia Type: Epidural ?Level of consciousness: awake, awake and alert, oriented and patient cooperative ?Pain management: pain level controlled ?Vital Signs Assessment: post-procedure vital signs reviewed and stable ?Respiratory status: spontaneous breathing, nonlabored ventilation and respiratory function stable ?Cardiovascular status: stable ?Postop Assessment: no headache, no backache, patient able to bend at knees, able to ambulate, adequate PO intake and no apparent nausea or vomiting ?Anesthetic complications: no ? ? ?No notable events documented. ? ? ?Last Vitals:  ?Vitals:  ? 07/28/21 0052 07/28/21 0542  ?BP: 115/71 115/75  ?Pulse: 89 97  ?Resp: 20 18  ?Temp: 37 ?C 36.4 ?C  ?SpO2: 98% 99%  ?  ?Last Pain:  ?Vitals:  ? 07/28/21 0547  ?TempSrc:   ?PainSc: 5   ? ? ?  ?  ?  ?  ?  ?  ? ?Patrese Neal,  Loraine Leriche R ? ? ? ? ?

## 2021-07-29 MED ORDER — SIMETHICONE 80 MG PO CHEW: 80.0000 mg | CHEWABLE_TABLET | ORAL | 0 refills | Status: DC | PRN

## 2021-07-29 MED ORDER — ACETAMINOPHEN 325 MG PO TABS: 650.0000 mg | ORAL_TABLET | ORAL | Status: DC | PRN

## 2021-07-29 MED ORDER — WITCH HAZEL-GLYCERIN EX PADS: 1.0000 "application " | MEDICATED_PAD | CUTANEOUS | 12 refills | Status: DC | PRN

## 2021-07-29 MED ORDER — PRENATAL MULTIVITAMIN CH: 1.0000 | ORAL_TABLET | Freq: Every day | ORAL | Status: DC

## 2021-07-29 MED ORDER — IBUPROFEN 600 MG PO TABS: 600.0000 mg | ORAL_TABLET | Freq: Four times a day (QID) | ORAL | 0 refills | Status: DC

## 2021-07-29 MED ORDER — COCONUT OIL OIL
1.0000 | TOPICAL_OIL | 0 refills | Status: DC | PRN
Start: 2021-07-29 — End: 2021-09-15

## 2021-07-29 MED ORDER — DIBUCAINE (PERIANAL) 1 % EX OINT: 1.0000 "application " | TOPICAL_OINTMENT | CUTANEOUS | Status: DC | PRN

## 2021-07-29 NOTE — Progress Notes (Signed)
Spoke with Transition of care and they noted they have tried to reach pt in room and were unable to due to phone issues. Pt cell phone number given to case manager to follow up. Transition of care said pt is okay to discharge. RN spoke with pt directly and pt stated she does not require any needs or additional support.  ?

## 2021-07-29 NOTE — TOC Initial Note (Signed)
Transition of Care (TOC) - Initial/Assessment Note  ? ? ?Patient Details  ?Name: Robin Campbell ?MRN: [id number] ?Date of Birth: [birth year] ? ?Transition of Care (TOC) CM/SW Contact:    ?Anselm Pancoast, RN ?Phone Number: ?07/29/2021, 3:58 PM ? ?Clinical Narrative:                 ?Spoke with patient who reports she is not engaged with [facility] services but verbalizes how to contact them if needed. Planning to breastfeed only. Reports strong support system and all needed equipment other than breast pump. Patient will engage with [facility] services to get electric breast pump. Reviewed PPD and resources if needed. No issues with transportation and has all follow up appointments scheduled. Confirmed TOC contact info if any problems with [facility] enrollment. Reports no issues with anxiety/depression and feels her and her baby are safe at home. No current TOC needs.  ? ?  ?  ? ? ?Patient Goals and CMS Choice ?  ?  ?  ? ?Expected Discharge Plan and Services ?  ?  ?  ?  ?  ?Expected Discharge Date: 07/29/21               ?  ?  ?  ?  ?  ?  ?  ?  ?  ?  ? ?Prior Living Arrangements/Services ?  ?  ?  ?       ?  ?  ?  ?  ? ?Activities of Daily Living ?Home Assistive Devices/Equipment: None ?ADL Screening (condition at time of admission) ?Patient's cognitive ability adequate to safely complete daily activities?: Yes ?Is the patient deaf or have difficulty hearing?: No ?Does the patient have difficulty seeing, even when wearing glasses/contacts?: No ?Does the patient have difficulty concentrating, remembering, or making decisions?: No ?Patient able to express need for assistance with ADLs?: Yes ?Does the patient have difficulty dressing or bathing?: No ?Independently performs ADLs?: Yes (appropriate for developmental age) ?Does the patient have difficulty walking or climbing stairs?: No ?Weakness of Legs: None ?Weakness of Arms/Hands: None ? ?Permission Sought/Granted ?  ?  ?   ?   ?   ?   ? ?Emotional Assessment ?  ?  ?  ?  ?  ?  ? ?Admission  diagnosis:  Amniotic fluid leaking [O42.90] ?Patient Active Problem List  ? Diagnosis Date Noted  ? Amniotic fluid leaking 07/27/2021  ? Postpartum care following vaginal delivery 07/27/2021  ? Encounter for care or examination of lactating mother 07/27/2021  ? [redacted] weeks gestation of pregnancy   ? Encounter for supervision of normal first pregnancy in third trimester 12/11/2020  ? ?PCP:  Alba Cory, MD ?Pharmacy:   ?[id number] [place], [place] - [address] ?[address] ?[place] [address] ?Phone: [phone] Fax: [phone] ? ? ? ? ?Social Determinants of Health (SDOH) Interventions ?  ? ?Readmission Risk Interventions ?   ? View : No data to display.  ?  ?  ?  ? ? ? ?

## 2021-07-29 NOTE — Progress Notes (Signed)
Discharge instructions given and reviewed with pt and family. Pt educated on follow up care, appointments and when to notify provider, questions invited and answered. ID bands matched with infant.  Pt and family noted understanding  to teaching/education.Pt escorted off unit by volunteer in wheelchair with infant safely in arms. Infant secured in car seat by family. 

## 2021-07-29 NOTE — Lactation Note (Signed)
This note was copied from a baby's chart. ?Lactation Consultation Note ? ?Patient Name: Robin Campbell ?Today's Date: 07/29/2021 ?Reason for consult: Follow-up assessment;Primapara;1st time breastfeeding ?Age:20 hours ? ?Maternal Data ? ?First time mom. Per mom baby is latching and breastfeeding and she feels breastfeeding has been steadily improving.Baby with multiple voids and stools. ? ?Has patient been taught Hand Expression?: Yes ?Does the patient have breastfeeding experience prior to this delivery?: No ? ?Feeding ?Mother's Current Feeding Choice: Breast Milk ? ? ?Interventions ?Interventions: Breast feeding basics reviewed;Education (Mom reports some slight right nipple tenderness at the last feed. Per mom baby's lips are flanged outward on to her areola. Discussed normal nipple tenderness, nutritive vs non nutritive sucking, management tips for sore nipples.) ? ?Discharge ?Discharge Education: Other (comment) (In anticipation of discharge to home today reviewed breastfeeding basics. Referenced mother baby Enjoy book.Mom verbalized understanding of information provided. Mom to [facility] to [facility]. Mom informed of LC at practice.) ?Pump: Manual (Mom reports she has 2 manual breastpumps at home.) ? ?Consult Status ?Consult Status: Complete (Complete) ? ? ? ?Fuller Song ?07/29/2021, 10:30 AM ? ? ? ?

## 2021-08-02 DIAGNOSIS — Z419 Encounter for procedure for purposes other than remedying health state, unspecified: Secondary | ICD-10-CM | POA: Diagnosis not present

## 2021-08-11 ENCOUNTER — Ambulatory Visit (INDEPENDENT_AMBULATORY_CARE_PROVIDER_SITE_OTHER): Payer: Medicaid Other | Admitting: Licensed Practical Nurse

## 2021-08-11 ENCOUNTER — Encounter: Payer: Self-pay | Admitting: Licensed Practical Nurse

## 2021-08-11 DIAGNOSIS — Z30011 Encounter for initial prescription of contraceptive pills: Secondary | ICD-10-CM

## 2021-08-11 NOTE — Patient Instructions (Signed)
INCREASING YOUR MILK SUPPLY ? ?Mothers make more milk by removing milk from their breasts often. ?Removing milk frequently: ? ?__ If you are breastfeeding your baby directly, ?breastfeed your baby at least every three hours during the day. ?__ Use your breast pump for 5 to 10 minutes after each feeding ?or pump approximately 30 to 60 minutes after you finish feeding ?(Choose the schedule that is more convenient for you and so you remove more milk with the pump.) ?__ If you are using a breast pump instead of breastfeeding directly, ?pump your breasts at least 8 times each 24 hours (every three hours). ? ?A schedule may be helpful as you plan at least 8 feedings and pumping. ?7:00 a.m. 10:00 a.m. 1:00 p.m. 4:00 p.m. ?7:00 p.m. 10:00 p.m. 1:00 a.m.* 4:00 a.m.* ?25% of your milk can be obtained during the night. ? ?*It is probably better to not set an alarm clock for nighttime feedings/pumping. If you or your ?baby wakes during the night, it is a good idea to feed or pump. You can feed or pump more ?often during the day (any time your baby is interested in feeding) in order to have 8 feedings ?or pumping each 24 hours. ? ?Removing as much milk as possible: ? ?__ Massage your breasts before feeding or pumping to move your milk to your nipple. Roll your ?nipples between your finger and thumb before feeding or pumping. ?__ Massage your breasts while you are feeding or pumping to help your milk flow. ?__ Always encourage your baby to take as much milk as possible at each feeding. Don?t stop ?your feedings just because a certain amount of time has passed. ? ?Taking care of yourself: ? ?__ Increase your rest for 3 to 5 days. Get other people to help with household chores. Sleep or ?rest when your baby sleeps. ?__ Eating nutritious foods helps you feel better. There are no foods that magically increase your ?milk supply. ?__ Drink plenty of fluids when you are thirsty. Don?t ?force? yourself to drink lots of water and ?fluids.  (Don?t drink more than you want.) ? ?Reglan (Metaclopromide): ? ?Reglan is a prescription medication which has been given to women to increase their breast milk ?supply. It increases the prolactin hormone which increases milk production. ?Reglan is often given to treat gastric reflux, a stomach condition. It is given to premature or fullterm babies who have this condition and is considered safe for these babies. Sometimes Reglan is ?given for months. ? ?Reglan is a medication which has few side effects. Some people find that it makes them sleepy. ?This can be helpful for new mothers who need to take naps since they are up at night with their ?babies. However, if you are very drowsy and sleepy when you drive a car, you need to have ?someone else drive, decrease your dosage of Reglan, or stop taking Reglan. ?If your milk supply does not increase with this dosage (after 1 to 2 weeks), ask your doctor if you ?can take 15mg of Reglan three times each day. ? ?After two weeks of taking Reglan, if your milk supply has increased enough to satisfy your baby, ?you can gradually reduce the amount that you take. If taking less Reglan causes you to make less ?milk, return to the dosage that you were taking. Some women need to maintain a low dose of ?Reglan for a long time. Other mothers find that they can stop taking raglan and keep making ?enough milk. ? ?Reglan helps about   80% of mothers make more milk. It is very important that you empty your ?breasts as completely as possible at least eight times each 24 hours. ? ?Fenugreek (Trigonella foenum-graecum): ? ?Fenugreek is an herb which is mostly used in the [place] to flavor artificial maple syrup. It ?has also been used to increase mothers? milk supply. ? ?Fenugreek is not recommended if you are pregnant, if you have asthma, or if you have ?diabetes. Fenugreek is sold in health food stores in tea and capsule forms. ? ?Mothers who take too much Fenugreek sometimes notice  that their skin and urine smell like ?maple syrup. Some mothers have noticed loose bowel movements when they take a large amount ?of Fenugreek. Many mothers take Fenugreek for several months. ? ?The usual dosage of Reglan that is given to mothers to increase their milk supply is: ?10-15mg by mouth three times a day for two weeks. ?The usual dosage of Fenugreek that is recommended for mothers to increase their milk supply is: ?3 capsules three times each day ?3-4 cups of tea per day ?It usually takes 3 to 5 days to notice much difference in your milk supply. ? ?

## 2021-08-11 NOTE — Progress Notes (Signed)
?Postpartum Visit  ?Chief Complaint:  ?Chief Complaint  ?Patient presents with  ? Postpartum Care  ? ? ?History of Present Illness: Patient is a 21 y.o. G1P0 presents for postpartum visit. ? ?Date of delivery: 07/27/21 ?Type of delivery: Vaginal delivery - Vacuum or forceps assisted  no ?Episiotomy No.  ?Laceration: no  ?Pregnancy or labor problems:  no ?Any problems since the delivery:  no ?Bleeding: light, brownish in color ?Perineum: no concerns ?Sleep: depends on how the baby sleeps, tries to nap when she can ?Birth control: desires POP ? ? ?Newborn Details:  ?SINGLETON :  ?1. Baby's name: [place]. Birth weight: 3200 grams ?Maternal Details:  ?Breast Feeding:  yes plus supplementing with formula, went through engorgement, but now never feels full, does not notice any changes to her breasts after feeding.  Is worried that baby is not getting enough so she gives 1 bottle a day.  Pediatrician told her baby is a little slow to gain but is past birth weight, has seen LC, was not instructed to supplement, infant has adequate output.  Shraddha has tried to pump, but does not express much. Has been eating oatmeal daily.  ?Post partum depression/anxiety noted:  no, states she was "sad" at first, feels like she is fine now, has been a little anxious about breastfeeding and wondering if the infant is getting enough.  ?Edinburgh Post-Partum Depression Score:  10  ?Date of last PAP: never collected d/t age  ? ?Past Medical History:  ?Diagnosis Date  ? Medical history non-contributory   ? ? ?Past Surgical History:  ?Procedure Laterality Date  ? NO PAST SURGERIES    ? ? ?Prior to Admission medications   ?Medication Sig Start Date End Date Taking? Authorizing Provider  ?acetaminophen (TYLENOL) 325 MG tablet Take 2 tablets (650 mg total) by mouth every 4 (four) hours as needed (for pain scale < 4  OR  temperature  >/=  100.5 F). 07/29/21  Yes Sayde Lish, Courtney Heys, CNM  ?acetaminophen (TYLENOL) 325 MG tablet Take 2 tablets (650 mg  total) by mouth every 4 (four) hours as needed (for pain scale < 4). 07/29/21  Yes Jarmaine Ehrler, Courtney Heys, CNM  ?ibuprofen (ADVIL) 600 MG tablet Take 1 tablet (600 mg total) by mouth every 6 (six) hours. 07/29/21  Yes Markale Birdsell, Courtney Heys, CNM  ?Prenatal Vit-Fe Fumarate-FA (PRENATAL MULTIVITAMIN) TABS tablet Take 1 tablet by mouth daily at 12 noon. 07/29/21  Yes Anhthu Perdew, Courtney Heys, CNM  ?simethicone (MYLICON) 80 MG chewable tablet Chew 1 tablet (80 mg total) by mouth as needed for flatulence. 07/29/21  Yes Qianna Clagett, Courtney Heys, CNM  ?coconut oil OIL Apply 1 application. topically as needed. 07/29/21   Laveah Gloster, Courtney Heys, CNM  ?dibucaine (NUPERCAINAL) 1 % OINT Place 1 application. rectally as needed for hemorrhoids. ?Patient not taking: Reported on 08/11/2021 07/29/21   Ellwood Sayers, CNM  ?witch hazel-glycerin (TUCKS) pad Apply 1 application. topically as needed for hemorrhoids. 07/29/21   Lidya Mccalister, Courtney Heys, CNM  ? ? ?No Known Allergies  ? ?Social History  ? ?Socioeconomic History  ? Marital status: Single  ?  Spouse name: Not on file  ? Number of children: Not on file  ? Years of education: Not on file  ? Highest education level: Not on file  ?Occupational History  ? Occupation: Retail  ?Tobacco Use  ? Smoking status: Never  ?  Passive exposure: Never  ? Smokeless tobacco: Never  ?Vaping Use  ? Vaping Use: Never used  ?Substance and  Sexual Activity  ? Alcohol use: Not Currently  ? Drug use: Never  ? Sexual activity: Yes  ?  Partners: Male  ?  Birth control/protection: None  ?Other Topics Concern  ? Not on file  ?Social History Narrative  ? Not on file  ? ?Social Determinants of Health  ? ?Financial Resource Strain: Not on file  ?Food Insecurity: Not on file  ?Transportation Needs: Not on file  ?Physical Activity: Not on file  ?Stress: Not on file  ?Social Connections: Not on file  ?Intimate Partner Violence: Not on file  ? ? ?Family History  ?Problem Relation Age of Onset  ? Colon cancer Father   ? Throat  cancer Father   ? ? ?Review of Systems  ?Constitutional:  Negative for chills, fever and malaise/fatigue.  ?Respiratory: Negative.    ?Cardiovascular: Negative.   ?Gastrointestinal: Negative.   ?Genitourinary: Negative.    ? ?Physical Exam ?BP 120/60   Ht 5\' 3"  (1.6 m)   Wt 144 lb (65.3 kg)   Breastfeeding Yes   BMI 25.51 kg/m?   ?Physical Exam ?Constitutional:   ?   Appearance: Normal appearance.  ?Genitourinary:  ?   Genitourinary Comments: Declines pelvic exam   ?Pulmonary:  ?   Effort: Pulmonary effort is normal.  ?Chest:  ?   Comments: Breasts: lactating, soft, no redness or masses, nipples erect and intact bilaterally  ?Abdominal:  ?   General: Abdomen is flat. There is no distension.  ?   Tenderness: There is no abdominal tenderness.  ?   Comments: Uterus not palpable  ?Musculoskeletal:  ?   Right lower leg: No edema.  ?   Left lower leg: No edema.  ?Neurological:  ?   General: No focal deficit present.  ?   Mental Status: She is alert and oriented to person, place, and time.  ?Skin: ?   General: Skin is warm.  ?Psychiatric:     ?   Mood and Affect: Mood normal.  ?  ? ? ?Assessment: 21 y.o. G1P0 presenting for 2 week postpartum visit ? ?Plan: ?Problem List Items Addressed This Visit   ?None ? ? ? ?1) Contraception Education given regarding options for contraception, including oral contraceptives. ? Script for POP sent, to start in 2 weeks or when desired  ? ?2)  Pap ?- ASCCP guidelines and rational discussed.  Due at age 43  ? ?3) Patient underwent screening for postpartum depression with NO concerns noted. ? ?4) Follow up in 4 weeks  ? ?36, CNM  ?Carie Caddy, [facility]  ?08/12/21  ?3:51 PM  ?  ?

## 2021-08-12 MED ORDER — NORETHINDRONE 0.35 MG PO TABS: 1.0000 | ORAL_TABLET | Freq: Every day | ORAL | 11 refills | Status: DC

## 2021-09-01 DIAGNOSIS — Z419 Encounter for procedure for purposes other than remedying health state, unspecified: Secondary | ICD-10-CM | POA: Diagnosis not present

## 2021-09-11 ENCOUNTER — Ambulatory Visit: Payer: Medicaid Other | Admitting: Advanced Practice Midwife

## 2021-09-15 ENCOUNTER — Encounter: Payer: Self-pay | Admitting: Advanced Practice Midwife

## 2021-09-15 ENCOUNTER — Ambulatory Visit (INDEPENDENT_AMBULATORY_CARE_PROVIDER_SITE_OTHER): Payer: Medicaid Other | Admitting: Advanced Practice Midwife

## 2021-09-15 NOTE — Progress Notes (Signed)
? ? ? ?Postpartum Visit  ?Chief Complaint:  ?Chief Complaint  ?Patient presents with  ? Postpartum Care  ? ? ?History of Present Illness: Patient is a 21 y.o. G1P1001 presents for postpartum visit. ? ?Review the Delivery Report for details.  ? ?Date of delivery: 07/27/2021 ?Type of delivery: Vaginal delivery - Vacuum or forceps assisted  no ?Episiotomy No.  ?Laceration: no  ?Pregnancy or labor problems:  no ?Any problems since the delivery:  no ? ?Newborn Details:  ?SINGLETON :  ?1. BabyGender female. Birth weight: 7 pounds 1 ounce ?Maternal Details:  ?Breast or formula feeding: breastfeeding ?Intercourse: Yes  ?Contraception after delivery:  used condom and will start POPs ?Any bowel or bladder issues: No  ?Post partum depression/anxiety noted:  no ?Edinburgh Post-Partum Depression Score: 2 ?Date of last PAP: first PAP next year ? ?Review of Systems: Review of Systems  ?Constitutional:  Negative for chills and fever.  ?HENT:  Negative for congestion, ear discharge, ear pain, hearing loss, sinus pain and sore throat.   ?Eyes:  Negative for blurred vision and double vision.  ?Respiratory:  Negative for cough, shortness of breath and wheezing.   ?Cardiovascular:  Negative for chest pain, palpitations and leg swelling.  ?Gastrointestinal:  Negative for abdominal pain, blood in stool, constipation, diarrhea, heartburn, melena, nausea and vomiting.  ?Genitourinary:  Negative for dysuria, flank pain, frequency, hematuria and urgency.  ?Musculoskeletal:  Negative for back pain, joint pain and myalgias.  ?Skin:  Negative for itching and rash.  ?Neurological:  Negative for dizziness, tingling, tremors, sensory change, speech change, focal weakness, seizures, loss of consciousness, weakness and headaches.  ?Endo/Heme/Allergies:  Negative for environmental allergies. Does not bruise/bleed easily.  ?Psychiatric/Behavioral:  Negative for depression, hallucinations, memory loss, substance abuse and suicidal ideas. The patient is  not nervous/anxious and does not have insomnia.   ? ? ? ?Past Medical History:  ?Past Medical History:  ?Diagnosis Date  ? Medical history non-contributory   ? ? ?Past Surgical History:  ?Past Surgical History:  ?Procedure Laterality Date  ? NO PAST SURGERIES    ? ? ?Family History:  ?Family History  ?Problem Relation Age of Onset  ? Colon cancer Father   ?     77s  ? Throat cancer Father   ? ? ?Social History:  ?Social History  ? ?Socioeconomic History  ? Marital status: Single  ?  Spouse name: Not on file  ? Number of children: Not on file  ? Years of education: Not on file  ? Highest education level: Not on file  ?Occupational History  ? Occupation: Retail  ?Tobacco Use  ? Smoking status: Never  ?  Passive exposure: Never  ? Smokeless tobacco: Never  ?Vaping Use  ? Vaping Use: Never used  ?Substance and Sexual Activity  ? Alcohol use: Not Currently  ? Drug use: Never  ? Sexual activity: Yes  ?  Partners: Male  ?  Birth control/protection: None, Condom  ?Other Topics Concern  ? Not on file  ?Social History Narrative  ? Not on file  ? ?Social Determinants of Health  ? ?Financial Resource Strain: Not on file  ?Food Insecurity: Not on file  ?Transportation Needs: Not on file  ?Physical Activity: Not on file  ?Stress: Not on file  ?Social Connections: Not on file  ?Intimate Partner Violence: Not on file  ? ? ?Allergies:  ?No Known Allergies ? ?Medications: ?Prior to Admission medications   ?Medication Sig Start Date End Date Taking? Authorizing Provider  ?  Prenatal Vit-Fe Fumarate-FA (PRENATAL MULTIVITAMIN) TABS tablet Take 1 tablet by mouth daily at 12 noon. 07/29/21  Yes Dominic, Nunzio Cobbs, CNM  ?norethindrone (ORTHO MICRONOR) 0.35 MG tablet Take 1 tablet (0.35 mg total) by mouth daily. ?Patient not taking: Reported on 09/15/2021 08/12/21   Allen Derry, CNM  ? ? ?Physical Exam ?Blood pressure 100/60, height 5\' 3"  (1.6 m), weight 144 lb (65.3 kg), last menstrual period 09/03/2021, currently breastfeeding. ?   ? ?General: NAD ?HEENT: normocephalic, anicteric ?Pulmonary: No increased work of breathing ?Abdomen: NABS, soft, non-tender, non-distended.  Umbilicus without lesions.  No hepatomegaly, splenomegaly or masses palpable. No evidence of hernia. ?Genitourinary: ? External: Normal external female genitalia.  Normal urethral meatus, normal Bartholin's and Skene's glands.   ? Vagina: Normal vaginal mucosa, no evidence of prolapse.   ? Cervix: Grossly normal in appearance, no bleeding ? Uterus: Non-enlarged, mobile, normal contour.  No CMT ? Adnexa: ovaries non-enlarged, no adnexal masses ? Rectal: deferred ?Extremities: no edema, erythema, or tenderness ?Neurologic: Grossly intact ?Psychiatric: mood appropriate, affect full ? ? Edinburgh Postnatal Depression Scale - 09/15/21 1539   ? ?  ? Edinburgh Postnatal Depression Scale:  In the Past 7 Days  ? I have been able to laugh and see the funny side of things. 0   ? I have looked forward with enjoyment to things. 0   ? I have blamed myself unnecessarily when things went wrong. 0   ? I have been anxious or worried for no good reason. 2   ? I have felt scared or panicky for no good reason. 0   ? Things have been getting on top of me. 0   ? I have been so unhappy that I have had difficulty sleeping. 0   ? I have felt sad or miserable. 0   ? I have been so unhappy that I have been crying. 0   ? The thought of harming myself has occurred to me. 0   ? Edinburgh Postnatal Depression Scale Total 2   ? ?  ?  ? ?  ? ? ?Assessment: 21 y.o. G1P1001 presenting for 6 week postpartum visit ? ?Plan: ?Problem List Items Addressed This Visit   ?None ?Visit Diagnoses   ? ? 6 weeks postpartum follow-up    -  Primary  ? ?  ? ? ? ?1) Contraception ?- Education given regarding options for contraception, as well as compatibility with breast feeding if applicable.  Patient plans on oral progesterone-only contraceptive for contraception. ? ?2)  Pap ?- ASCCP guidelines and rationale discussed.   Patient opts for  begin at age 14  screening interval ? ?3) Patient underwent screening for postpartum depression with no signs of depression ? ?4) Return in about 1 year (around 09/16/2022) for annual established gyn. ? ? ?Rod Can, CNM ?[facility] ?09/15/2021, 4:04 PM ? ? ?  ?

## 2021-09-17 ENCOUNTER — Inpatient Hospital Stay
Admission: EM | Admit: 2021-09-17 | Discharge: 2021-09-20 | DRG: 419 | Disposition: A | Discharge status: Home / Self Care | Payer: Medicaid Other | Attending: Internal Medicine | Admitting: Internal Medicine

## 2021-09-17 ENCOUNTER — Other Ambulatory Visit: Payer: Self-pay

## 2021-09-17 ENCOUNTER — Emergency Department: Payer: Medicaid Other

## 2021-09-17 DIAGNOSIS — Z800 Family history of malignant neoplasm of digestive organs: Secondary | ICD-10-CM | POA: Diagnosis not present

## 2021-09-17 DIAGNOSIS — R231 Pallor: Secondary | ICD-10-CM | POA: Diagnosis not present

## 2021-09-17 DIAGNOSIS — K8020 Calculus of gallbladder without cholecystitis without obstruction: Secondary | ICD-10-CM | POA: Diagnosis not present

## 2021-09-17 DIAGNOSIS — R748 Abnormal levels of other serum enzymes: Secondary | ICD-10-CM | POA: Diagnosis present

## 2021-09-17 DIAGNOSIS — K8050 Calculus of bile duct without cholangitis or cholecystitis without obstruction: Principal | ICD-10-CM | POA: Diagnosis present

## 2021-09-17 DIAGNOSIS — R140 Abdominal distension (gaseous): Secondary | ICD-10-CM | POA: Diagnosis not present

## 2021-09-17 DIAGNOSIS — R7401 Elevation of levels of liver transaminase levels: Secondary | ICD-10-CM | POA: Diagnosis present

## 2021-09-17 DIAGNOSIS — K8071 Calculus of gallbladder and bile duct without cholecystitis with obstruction: Secondary | ICD-10-CM | POA: Diagnosis not present

## 2021-09-17 DIAGNOSIS — Z808 Family history of malignant neoplasm of other organs or systems: Secondary | ICD-10-CM

## 2021-09-17 DIAGNOSIS — E876 Hypokalemia: Secondary | ICD-10-CM | POA: Diagnosis not present

## 2021-09-17 DIAGNOSIS — R1084 Generalized abdominal pain: Secondary | ICD-10-CM | POA: Diagnosis not present

## 2021-09-17 DIAGNOSIS — R1011 Right upper quadrant pain: Secondary | ICD-10-CM | POA: Diagnosis not present

## 2021-09-17 DIAGNOSIS — K8021 Calculus of gallbladder without cholecystitis with obstruction: Secondary | ICD-10-CM

## 2021-09-17 DIAGNOSIS — R7989 Other specified abnormal findings of blood chemistry: Secondary | ICD-10-CM | POA: Diagnosis not present

## 2021-09-17 DIAGNOSIS — K8080 Other cholelithiasis without obstruction: Secondary | ICD-10-CM | POA: Diagnosis not present

## 2021-09-17 DIAGNOSIS — R42 Dizziness and giddiness: Secondary | ICD-10-CM | POA: Diagnosis not present

## 2021-09-17 DIAGNOSIS — R9431 Abnormal electrocardiogram [ECG] [EKG]: Secondary | ICD-10-CM | POA: Diagnosis not present

## 2021-09-17 DIAGNOSIS — K838 Other specified diseases of biliary tract: Secondary | ICD-10-CM | POA: Diagnosis not present

## 2021-09-17 DIAGNOSIS — Z743 Need for continuous supervision: Secondary | ICD-10-CM | POA: Diagnosis not present

## 2021-09-17 DIAGNOSIS — D72829 Elevated white blood cell count, unspecified: Secondary | ICD-10-CM | POA: Diagnosis not present

## 2021-09-17 DIAGNOSIS — I959 Hypotension, unspecified: Secondary | ICD-10-CM | POA: Diagnosis not present

## 2021-09-17 DIAGNOSIS — K8070 Calculus of gallbladder and bile duct without cholecystitis without obstruction: Secondary | ICD-10-CM | POA: Diagnosis not present

## 2021-09-17 LAB — COMPREHENSIVE METABOLIC PANEL
ALT: 69 U/L — ABNORMAL HIGH (ref 0–44)
AST: 132 U/L — ABNORMAL HIGH (ref 15–41)
Albumin: 3.8 g/dL (ref 3.5–5.0)
Alkaline Phosphatase: 110 U/L (ref 38–126)
Anion gap: 8 (ref 5–15)
BUN: 15 mg/dL (ref 6–20)
CO2: 24 mmol/L (ref 22–32)
Calcium: 8.6 mg/dL — ABNORMAL LOW (ref 8.9–10.3)
Chloride: 110 mmol/L (ref 98–111)
Creatinine, Ser: 0.6 mg/dL (ref 0.44–1.00)
GFR, Estimated: >60 mL/min (ref >60)
Glucose, Bld: 108 mg/dL — ABNORMAL HIGH (ref 70–99)
Potassium: 3.4 mmol/L — ABNORMAL LOW (ref 3.5–5.1)
Sodium: 142 mmol/L (ref 135–145)
Total Bilirubin: 0.8 mg/dL (ref 0.3–1.2)
Total Protein: 7 g/dL (ref 6.5–8.1)

## 2021-09-17 LAB — CBC
HCT: 40.1 % (ref 36.0–46.0)
Hemoglobin: 12.6 g/dL (ref 12.0–15.0)
MCH: 28 pg (ref 26.0–34.0)
MCHC: 31.4 g/dL (ref 30.0–36.0)
MCV: 89.1 fL (ref 80.0–100.0)
Platelets: 310 10*3/uL (ref 150–400)
RBC: 4.5 MIL/uL (ref 3.87–5.11)
RDW: 13.8 % (ref 11.5–15.5)
WBC: 18.6 10*3/uL — ABNORMAL HIGH (ref 4.0–10.5)
nRBC: 0 % (ref 0.0–0.2)

## 2021-09-17 LAB — URINALYSIS, ROUTINE W REFLEX MICROSCOPIC
Bilirubin Urine: NEGATIVE
Glucose, UA: NEGATIVE mg/dL
Ketones, ur: 5 mg/dL — AB
Nitrite: NEGATIVE
Protein, ur: NEGATIVE mg/dL
Specific Gravity, Urine: 1.023 (ref 1.005–1.030)
pH: 7 (ref 5.0–8.0)

## 2021-09-17 LAB — APTT: aPTT: 27 seconds (ref 24–36)

## 2021-09-17 LAB — POC URINE PREG, ED: Preg Test, Ur: NEGATIVE

## 2021-09-17 LAB — LACTIC ACID, PLASMA: Lactic Acid, Venous: 1.2 mmol/L (ref 0.5–1.9)

## 2021-09-17 LAB — PROTIME-INR
INR: 1 (ref 0.8–1.2)
Prothrombin Time: 12.8 seconds (ref 11.4–15.2)

## 2021-09-17 LAB — LIPASE, BLOOD: Lipase: 40 U/L (ref 11–51)

## 2021-09-17 MED ORDER — ONDANSETRON HCL 4 MG/2ML IJ SOLN: 4.0000 mg | Freq: Four times a day (QID) | INTRAMUSCULAR | Status: DC | PRN

## 2021-09-17 MED ORDER — ACETAMINOPHEN 650 MG RE SUPP: 650.0000 mg | Freq: Four times a day (QID) | RECTAL | Status: DC | PRN

## 2021-09-17 MED ORDER — ENOXAPARIN SODIUM 40 MG/0.4ML IJ SOSY: 40.0000 mg | PREFILLED_SYRINGE | INTRAMUSCULAR | Status: DC

## 2021-09-17 MED ORDER — LACTATED RINGERS IV BOLUS
1000.0000 mL | Freq: Once | INTRAVENOUS | Status: AC
  Administered 2021-09-17: 1000 mL via INTRAVENOUS

## 2021-09-17 MED ORDER — ACETAMINOPHEN 325 MG PO TABS
650.0000 mg | ORAL_TABLET | Freq: Four times a day (QID) | ORAL | Status: DC | PRN
  Administered 2021-09-17: 650 mg via ORAL
  Filled 2021-09-17 (×2): qty 2

## 2021-09-17 MED ORDER — HYDROMORPHONE HCL 1 MG/ML IJ SOLN
1.0000 mg | Freq: Once | INTRAMUSCULAR | Status: AC
  Administered 2021-09-17: 1 mg via INTRAVENOUS
  Filled 2021-09-17: qty 1

## 2021-09-17 MED ORDER — SODIUM CHLORIDE 0.9 % IV SOLN: INTRAVENOUS | Status: DC

## 2021-09-17 MED ORDER — PIPERACILLIN-TAZOBACTAM 3.375 G IVPB
3.3750 g | Freq: Three times a day (TID) | INTRAVENOUS | Status: DC
  Administered 2021-09-18 – 2021-09-20 (×6): 3.375 g via INTRAVENOUS
  Filled 2021-09-17 (×6): qty 50

## 2021-09-17 MED ORDER — TRAZODONE HCL 50 MG PO TABS: 25.0000 mg | ORAL_TABLET | Freq: Every evening | ORAL | Status: DC | PRN

## 2021-09-17 MED ORDER — PIPERACILLIN-TAZOBACTAM 3.375 G IVPB 30 MIN
3.3750 g | Freq: Once | INTRAVENOUS | Status: AC
  Administered 2021-09-17: 3.375 g via INTRAVENOUS
  Filled 2021-09-17: qty 50

## 2021-09-17 MED ORDER — ONDANSETRON HCL 4 MG/2ML IJ SOLN
4.0000 mg | Freq: Once | INTRAMUSCULAR | Status: AC
  Administered 2021-09-17: 4 mg via INTRAVENOUS
  Filled 2021-09-17: qty 2

## 2021-09-17 MED ORDER — MAGNESIUM HYDROXIDE 400 MG/5ML PO SUSP: 30.0000 mL | Freq: Every day | ORAL | Status: DC | PRN

## 2021-09-17 MED ORDER — ONDANSETRON HCL 4 MG PO TABS: 4.0000 mg | ORAL_TABLET | Freq: Four times a day (QID) | ORAL | Status: DC | PRN

## 2021-09-17 MED ORDER — SODIUM CHLORIDE 0.9 % IV SOLN: 2.0000 g | Freq: Once | INTRAVENOUS | Status: DC

## 2021-09-17 MED ORDER — METRONIDAZOLE 500 MG/100ML IV SOLN: 500.0000 mg | Freq: Two times a day (BID) | INTRAVENOUS | Status: DC

## 2021-09-17 MED ORDER — GADOBUTROL 1 MMOL/ML IV SOLN
6.0000 mL | Freq: Once | INTRAVENOUS | Status: AC | PRN
  Administered 2021-09-17: 7 mL via INTRAVENOUS

## 2021-09-17 NOTE — ED Provider Notes (Signed)
? ?[facility] ?Provider Note ? ? ? Event Date/Time  ? First MD Initiated Contact with Patient 09/17/21 1606   ?  (approximate) ? ? ?History  ? ?Abdominal Pain ? ? ?HPI ? ?Robin Campbell is a 21 y.o. female who presents to the ED for evaluation of Abdominal Pain ?  ?I review obstetric DC summary from 3/28.  G1, P1 who gave birth at term, uncomplicated labor, SVD and uncomplicated postpartum course. ?I reviewed 5/15 follow-up OB/GYN clinic visit without acute concerns. ? ?Patient presents to the ED for acute on chronic right-sided abdominal pain.  She reports intermittently feeling right-sided discomfort since her child's birth a few weeks ago, but seem to be worsening the past couple weeks.  But this morning she woke with significantly more severe symptoms have been persistent and not waxing and waning like they were before.  Reports nausea and emesis, inability to tolerate any p.o.  Persistent severe pain. ? ?Denies any shortness of breath, falls or syncope, diarrhea, dysuria ? ?Physical Exam  ? ?Triage Vital Signs: ?ED Triage Vitals [09/17/21 1341]  ?Enc Vitals Group  ?   BP 100/71  ?   Pulse Rate 74  ?   Resp 18  ?   Temp 98.2 ?F (36.8 ?C)  ?   Temp Source Oral  ?   SpO2 98 %  ?   Weight   ?   Height   ?   Head Circumference   ?   Peak Flow   ?   Pain Score   ?   Pain Loc   ?   Pain Edu?   ?   Excl. in GC?   ? ? ?Most recent vital signs: ?Vitals:  ? 09/17/21 1700 09/17/21 1730  ?BP: (!) 97/59 (!) 98/59  ?Pulse: 98 (!) 53  ?Resp:  16  ?Temp:    ?SpO2: 94% 100%  ? ? ?General: Awake, no distress.  Appears uncomfortable initially prior to analgesic medications. ?CV:  Good peripheral perfusion.  ?Resp:  Normal effort.  ?Abd:  No distention.  RUQ tenderness, lower abdomen is benign ?MSK:  No deformity noted.  ?Neuro:  No focal deficits appreciated. ?Other:   ? ? ?ED Results / Procedures / Treatments  ? ?Labs ?(all labs ordered are listed, but only abnormal results are displayed) ?Labs Reviewed   ?COMPREHENSIVE METABOLIC PANEL - Abnormal; Notable for the following components:  ?    Result Value  ? Potassium 3.4 (*)   ? Glucose, Bld 108 (*)   ? Calcium 8.6 (*)   ? AST 132 (*)   ? ALT 69 (*)   ? All other components within normal limits  ?CBC - Abnormal; Notable for the following components:  ? WBC 18.6 (*)   ? All other components within normal limits  ?URINALYSIS, ROUTINE W REFLEX MICROSCOPIC - Abnormal; Notable for the following components:  ? Color, Urine YELLOW (*)   ? APPearance HAZY (*)   ? Hgb urine dipstick SMALL (*)   ? Ketones, ur 5 (*)   ? Leukocytes,Ua TRACE (*)   ? Bacteria, UA RARE (*)   ? All other components within normal limits  ?LIPASE, BLOOD  ?HIV ANTIBODY (ROUTINE TESTING W REFLEX)  ?CBC  ?CREATININE, SERUM  ?COMPREHENSIVE METABOLIC PANEL  ?CBC  ?POC URINE PREG, ED  ? ? ?EKG ? ? ?RADIOLOGY ?RUQ ultrasound interpreted by me with cholelithiasis without cholecystitis, evidence of biliary dilation ? ?Official radiology report(s): ?MR 3D Recon At Scanner ? ?  Result Date: 09/17/2021 ?CLINICAL DATA:  Right upper quadrant pain and vomiting. Elevated liver function tests. Cholelithiasis. Biliary ductal dilatation on recent ultrasound. EXAM: MRI ABDOMEN WITHOUT AND WITH CONTRAST (INCLUDING MRCP) TECHNIQUE: Multiplanar multisequence MR imaging of the abdomen was performed both before and after the administration of intravenous contrast. Heavily T2-weighted images of the biliary and pancreatic ducts were obtained, and three-dimensional MRCP images were rendered by post processing. CONTRAST:  59mL GADAVIST GADOBUTROL 1 MMOL/ML IV SOLN COMPARISON:  Ultrasound on 09/17/2021 FINDINGS: Lower chest: No acute findings. Hepatobiliary: No hepatic masses identified. Multiple tiny gallstones are seen, however there is no evidence of cholecystitis. Mild biliary ductal dilatation is seen, with common bile duct measuring 7 mm. A single tiny less than 5 mm calculus is seen in the distal common bile duct (e.g. Image  23/13). Pancreas: No mass or inflammatory changes. No evidence of pancreatic ductal dilatation. Spleen:  Within normal limits in size and appearance. Adrenals/Urinary Tract: No masses identified. No evidence of hydronephrosis. Stomach/Bowel: Unremarkable. Vascular/Lymphatic: No pathologically enlarged lymph nodes identified. No acute vascular findings. Other:  None. Musculoskeletal:  No suspicious bone lesions identified. IMPRESSION: Cholelithiasis. No radiographic evidence of cholecystitis. Mild biliary ductal dilatation, with tiny less than 5 mm calculus in distal common bile duct. Electronically Signed   By: Danae Orleans M.D.   On: 09/17/2021 18:53  ? ?MR ABDOMEN MRCP W WO CONTAST ? ?Result Date: 09/17/2021 ?CLINICAL DATA:  Right upper quadrant pain and vomiting. Elevated liver function tests. Cholelithiasis. Biliary ductal dilatation on recent ultrasound. EXAM: MRI ABDOMEN WITHOUT AND WITH CONTRAST (INCLUDING MRCP) TECHNIQUE: Multiplanar multisequence MR imaging of the abdomen was performed both before and after the administration of intravenous contrast. Heavily T2-weighted images of the biliary and pancreatic ducts were obtained, and three-dimensional MRCP images were rendered by post processing. CONTRAST:  33mL GADAVIST GADOBUTROL 1 MMOL/ML IV SOLN COMPARISON:  Ultrasound on 09/17/2021 FINDINGS: Lower chest: No acute findings. Hepatobiliary: No hepatic masses identified. Multiple tiny gallstones are seen, however there is no evidence of cholecystitis. Mild biliary ductal dilatation is seen, with common bile duct measuring 7 mm. A single tiny less than 5 mm calculus is seen in the distal common bile duct (e.g. Image 23/13). Pancreas: No mass or inflammatory changes. No evidence of pancreatic ductal dilatation. Spleen:  Within normal limits in size and appearance. Adrenals/Urinary Tract: No masses identified. No evidence of hydronephrosis. Stomach/Bowel: Unremarkable. Vascular/Lymphatic: No pathologically  enlarged lymph nodes identified. No acute vascular findings. Other:  None. Musculoskeletal:  No suspicious bone lesions identified. IMPRESSION: Cholelithiasis. No radiographic evidence of cholecystitis. Mild biliary ductal dilatation, with tiny less than 5 mm calculus in distal common bile duct. Electronically Signed   By: Danae Orleans M.D.   On: 09/17/2021 18:53  ? ?US Abdomen Limited RUQ (LIVER/GB) ? ?Result Date: 09/17/2021 ?CLINICAL DATA:  RIGHT upper quadrant pain for 2 weeks with increasing severity. EXAM: ULTRASOUND ABDOMEN LIMITED RIGHT UPPER QUADRANT COMPARISON:  None Available. FINDINGS: Gallbladder: Numerous gallstones in the gallbladder. No reported tenderness over the gallbladder. No wall thickening or pericholecystic fluid. Largest gallstone in the gallbladder approximately 4 mm. Common bile duct: Diameter: 7.4 mm Intrahepatic biliary tree with mild intrahepatic biliary duct distension as well. Liver: Normal hepatic echotexture. Subtle echogenic focus in the RIGHT lobe of the liver measuring 12 mm may represent averaging of periportal fat or small hemangioma not well assessed. Portal vein is patent on color Doppler imaging with normal direction of blood flow towards the liver. Other: None. IMPRESSION: Cholelithiasis  without acute cholecystitis. Biliary duct obstruction with mild intra and extrahepatic biliary duct dilation. MRI/MRCP may be helpful for further evaluation. Question small RIGHT hepatic lesion likely hemangioma or periportal fat. Electronically Signed   By: Donzetta KohutGeoffrey  Wile M.D.   On: 09/17/2021 15:10   ? ?PROCEDURES and INTERVENTIONS: ? ?.1-3 Lead EKG Interpretation ?Performed by: Delton PrairieSmith, Reise Hietala, MD ?Authorized by: Delton PrairieSmith, Beatriz Quintela, MD  ? ?  Interpretation: normal   ?  ECG rate:  68 ?  ECG rate assessment: normal   ?  Rhythm: sinus rhythm   ?  Ectopy: none   ?  Conduction: normal   ? ?Medications  ?piperacillin-tazobactam (ZOSYN) IVPB 3.375 g (has no administration in time range)  ?enoxaparin  (LOVENOX) injection 40 mg (has no administration in time range)  ?0.9 %  sodium chloride infusion (has no administration in time range)  ?acetaminophen (TYLENOL) tablet 650 mg (has no administration in time range)

## 2021-09-17 NOTE — ED Triage Notes (Signed)
Pt comes into the ED via [facility] from home with c/o RUQ pain that radiates into the back for the past week, today with vomiting x1. Recent pregnancy/delivery 6 weeks ago ? ?514ml NS infused ?#20LAC ?107/50 ?HR 72 ?CBG157 ?98%RA ?

## 2021-09-17 NOTE — H&P (Signed)
[place]   PATIENT NAME: Robin Campbell    MR#:  [id number]  DATE OF BIRTH:  12-16-2000  DATE OF ADMISSION:  09/17/2021  PRIMARY CARE PHYSICIAN: Pcp, No   Patient is coming from: Home     REQUESTING/REFERRING PHYSICIAN: Vladimir Crofts, MD  CHIEF COMPLAINT:   Chief Complaint  Patient presents with   Abdominal Pain    HISTORY OF PRESENT ILLNESS:  Robin Campbell is a 21 y.o. female with no chronic medical problems, who presented to the emergency room with acute onset of right flank pain radiating to her right upper quadrant which has been intermittent over the last 7 weeks since she delivered her first baby, and got significantly worse over the last couple of weeks occurring every night.  She described it as a colicky pain.  No fever or chills.  She had nausea and vomiting today.  No dysuria oliguria, hematuria or urinary frequency or urgency or flank pain.  ED Course: Upon presentation to the emergency room, vital signs revealed a blood pressure of 97/59 and later 110/64 and heart rate was 98 with otherwise normal vital signs.  Labs revealed mild hypokalemia and AST 132 with ALT of 69.  CBC showed leukocytosis of 18.6.  Imaging: Right upper quadrant ultrasound revealed the following: Cholelithiasis without acute cholecystitis.   Biliary duct obstruction with mild intra and extrahepatic biliary duct dilation. MRI/MRCP may be helpful for further evaluation.   Question small RIGHT hepatic lesion likely hemangioma or periportal fat. MRCP revealed cholelithiasis without evidence of cholecystitis.  It showed mild biliary ductal dilatation with tiny less than 5 mm calculus in the distal common bile duct.  The patient was given 1 mg of IV Dilaudid, 1 L bolus of IV lactated Ringer and 3.375 g of IV Zosyn.  She will be admitted to a medical bed for further evaluation and management.   PAST MEDICAL HISTORY:   Past Medical History:  Diagnosis Date   Medical history  non-contributory   She denies any chronic medical problems.  PAST SURGICAL HISTORY:   Past Surgical History:  Procedure Laterality Date   NO PAST SURGERIES    . She denies any previous surgeries SOCIAL HISTORY:   Social History   Tobacco Use   Smoking status: Never    Passive exposure: Never   Smokeless tobacco: Never  Substance Use Topics   Alcohol use: Not Currently    FAMILY HISTORY:   Family History  Problem Relation Age of Onset   Colon cancer Father        72s   Throat cancer Father     DRUG ALLERGIES:  No Known Allergies  REVIEW OF SYSTEMS:   ROS As per history of present illness. All pertinent systems were reviewed above. Constitutional, HEENT, cardiovascular, respiratory, GI, GU, musculoskeletal, neuro, psychiatric, endocrine, integumentary and hematologic systems were reviewed and are otherwise negative/unremarkable except for positive findings mentioned above in the HPI.   MEDICATIONS AT HOME:   Prior to Admission medications   Medication Sig Start Date End Date Taking? Authorizing Provider  norethindrone (ORTHO MICRONOR) 0.35 MG tablet Take 1 tablet (0.35 mg total) by mouth daily. Patient not taking: Reported on 09/15/2021 08/12/21   DominicNunzio Cobbs, CNM  Prenatal Vit-Fe Fumarate-FA (PRENATAL MULTIVITAMIN) TABS tablet Take 1 tablet by mouth daily at 12 noon. 07/29/21   Dominic, Nunzio Cobbs, CNM      VITAL SIGNS:  Blood pressure 110/64, pulse 70, temperature 98 F (36.7 C), temperature source  Oral, resp. rate 20, height 5\' 3"  (1.6 m), weight 65 kg, last menstrual period 09/03/2021, SpO2 100 %, currently breastfeeding.  PHYSICAL EXAMINATION:  Physical Exam  GENERAL:  21 y.o.-year-old Caucasian female patient lying in the bed with no acute distress.  EYES: Pupils equal, round, reactive to light and accommodation. No scleral icterus. Extraocular muscles intact.  HEENT: Head atraumatic, normocephalic. Oropharynx and nasopharynx clear.  NECK:   Supple, no jugular venous distention. No thyroid enlargement, no tenderness.  LUNGS: Normal breath sounds bilaterally, no wheezing, rales,rhonchi or crepitation. No use of accessory muscles of respiration.  CARDIOVASCULAR: Regular rate and rhythm, S1, S2 normal. No murmurs, rubs, or gallops.  ABDOMEN: Soft, nondistended, with mild right upper quadrant tenderness without rebound tenderness guarding or rigidity.. Bowel sounds present. No organomegaly or mass.  EXTREMITIES: No pedal edema, cyanosis, or clubbing.  NEUROLOGIC: Cranial nerves II through XII are intact. Muscle strength 5/5 in all extremities. Sensation intact. Gait not checked.  PSYCHIATRIC: The patient is alert and oriented x 3.  Normal affect and good eye contact. SKIN: No obvious rash, lesion, or ulcer.   LABORATORY PANEL:   CBC Recent Labs  Lab 09/17/21 1341  WBC 18.6*  HGB 12.6  HCT 40.1  PLT 310   ------------------------------------------------------------------------------------------------------------------  Chemistries  Recent Labs  Lab 09/17/21 1341  NA 142  K 3.4*  CL 110  CO2 24  GLUCOSE 108*  BUN 15  CREATININE 0.60  CALCIUM 8.6*  AST 132*  ALT 69*  ALKPHOS 110  BILITOT 0.8   ------------------------------------------------------------------------------------------------------------------  Cardiac Enzymes No results for input(s): TROPONINI in the last 168 hours. ------------------------------------------------------------------------------------------------------------------  RADIOLOGY:  MR 3D Recon At Scanner  Result Date: 09/17/2021 CLINICAL DATA:  Right upper quadrant pain and vomiting. Elevated liver function tests. Cholelithiasis. Biliary ductal dilatation on recent ultrasound. EXAM: MRI ABDOMEN WITHOUT AND WITH CONTRAST (INCLUDING MRCP) TECHNIQUE: Multiplanar multisequence MR imaging of the abdomen was performed both before and after the administration of intravenous contrast. Heavily  T2-weighted images of the biliary and pancreatic ducts were obtained, and three-dimensional MRCP images were rendered by post processing. CONTRAST:  81mL GADAVIST GADOBUTROL 1 MMOL/ML IV SOLN COMPARISON:  Ultrasound on 09/17/2021 FINDINGS: Lower chest: No acute findings. Hepatobiliary: No hepatic masses identified. Multiple tiny gallstones are seen, however there is no evidence of cholecystitis. Mild biliary ductal dilatation is seen, with common bile duct measuring 7 mm. A single tiny less than 5 mm calculus is seen in the distal common bile duct (e.g. Image 23/13). Pancreas: No mass or inflammatory changes. No evidence of pancreatic ductal dilatation. Spleen:  Within normal limits in size and appearance. Adrenals/Urinary Tract: No masses identified. No evidence of hydronephrosis. Stomach/Bowel: Unremarkable. Vascular/Lymphatic: No pathologically enlarged lymph nodes identified. No acute vascular findings. Other:  None. Musculoskeletal:  No suspicious bone lesions identified. IMPRESSION: Cholelithiasis. No radiographic evidence of cholecystitis. Mild biliary ductal dilatation, with tiny less than 5 mm calculus in distal common bile duct. Electronically Signed   By: Marlaine Hind M.D.   On: 09/17/2021 18:53   MR ABDOMEN MRCP W WO CONTAST  Result Date: 09/17/2021 CLINICAL DATA:  Right upper quadrant pain and vomiting. Elevated liver function tests. Cholelithiasis. Biliary ductal dilatation on recent ultrasound. EXAM: MRI ABDOMEN WITHOUT AND WITH CONTRAST (INCLUDING MRCP) TECHNIQUE: Multiplanar multisequence MR imaging of the abdomen was performed both before and after the administration of intravenous contrast. Heavily T2-weighted images of the biliary and pancreatic ducts were obtained, and three-dimensional MRCP images were rendered by post processing. CONTRAST:  51mL GADAVIST GADOBUTROL 1 MMOL/ML IV SOLN COMPARISON:  Ultrasound on 09/17/2021 FINDINGS: Lower chest: No acute findings. Hepatobiliary: No hepatic  masses identified. Multiple tiny gallstones are seen, however there is no evidence of cholecystitis. Mild biliary ductal dilatation is seen, with common bile duct measuring 7 mm. A single tiny less than 5 mm calculus is seen in the distal common bile duct (e.g. Image 23/13). Pancreas: No mass or inflammatory changes. No evidence of pancreatic ductal dilatation. Spleen:  Within normal limits in size and appearance. Adrenals/Urinary Tract: No masses identified. No evidence of hydronephrosis. Stomach/Bowel: Unremarkable. Vascular/Lymphatic: No pathologically enlarged lymph nodes identified. No acute vascular findings. Other:  None. Musculoskeletal:  No suspicious bone lesions identified. IMPRESSION: Cholelithiasis. No radiographic evidence of cholecystitis. Mild biliary ductal dilatation, with tiny less than 5 mm calculus in distal common bile duct. Electronically Signed   By: Marlaine Hind M.D.   On: 09/17/2021 18:53   US Abdomen Limited RUQ (LIVER/GB)  Result Date: 09/17/2021 CLINICAL DATA:  RIGHT upper quadrant pain for 2 weeks with increasing severity. EXAM: ULTRASOUND ABDOMEN LIMITED RIGHT UPPER QUADRANT COMPARISON:  None Available. FINDINGS: Gallbladder: Numerous gallstones in the gallbladder. No reported tenderness over the gallbladder. No wall thickening or pericholecystic fluid. Largest gallstone in the gallbladder approximately 4 mm. Common bile duct: Diameter: 7.4 mm Intrahepatic biliary tree with mild intrahepatic biliary duct distension as well. Liver: Normal hepatic echotexture. Subtle echogenic focus in the RIGHT lobe of the liver measuring 12 mm may represent averaging of periportal fat or small hemangioma not well assessed. Portal vein is patent on color Doppler imaging with normal direction of blood flow towards the liver. Other: None. IMPRESSION: Cholelithiasis without acute cholecystitis. Biliary duct obstruction with mild intra and extrahepatic biliary duct dilation. MRI/MRCP may be helpful for  further evaluation. Question small RIGHT hepatic lesion likely hemangioma or periportal fat. Electronically Signed   By: Zetta Bills M.D.   On: 09/17/2021 15:10      IMPRESSION AND PLAN:  Assessment and Plan: * Choledocholithiasis - The patient will be admitted to a medical bed. - Her cholelithiasis is significantly symptomatic with recurrent biliary colic for the last month. - Pain management will be provided. - She will be kept n.p.o. after midnight. - We will continue her on IV Zosyn given her associated sepsis as manifested by significant leukocytosis and tachycardia. - GI consultation will be obtained. - Dr. Haig Prophet was notified about the patient and indicated that Dr. Allen Norris will be available for ERCP tomorrow.  Hypokalemia - Potassium will be replaced and magnesium level will be checked.  Elevated transaminase level - This could be related to her cholelithiasis. - We will follow LFTs with hydration. - Management otherwise as above.   DVT prophylaxis: SCDs  for now pending her procedure. Advanced Care Planning:  Code Status: full code. Family Communication:  The plan of care was discussed in details with the patient (and family). I answered all questions. The patient agreed to proceed with the above mentioned plan. Further management will depend upon hospital course. Disposition Plan: Back to previous home environment Consults called: Gastroenterology. All the records are reviewed and case discussed with ED provider.  Status is: Inpatient  At the time of the admission, it appears that the appropriate admission status for this patient is inpatient.  This is judged to be reasonable and necessary in order to provide the required intensity of service to ensure the patient's safety given the presenting symptoms, physical exam findings and initial radiographic and  laboratory data in the context of comorbid conditions.  The patient requires inpatient status due to high intensity of  service, high risk of further deterioration and high frequency of surveillance required.  I certify that at the time of admission, it is my clinical judgment that the patient will require inpatient hospital care extending more than 2 midnights.                            Dispo: The patient is from: Home              Anticipated d/c is to: Home              Patient currently is not medically stable to d/c.              Difficult to place patient: No  Christel Mormon M.D on 09/18/2021 at 1:14 AM  [facility]   From 7 PM-7 AM, contact night-coverage [email or web address]  CC: Primary care physician; Pcp, No

## 2021-09-17 NOTE — ED Notes (Signed)
Patient provided with breast pump and materials  ?

## 2021-09-17 NOTE — ED Notes (Signed)
Patient transported to MRI 

## 2021-09-17 NOTE — Sepsis Progress Note (Signed)
Following for sepsis monitoring. No record of blood cultures or lactic acid collected. Patient has since been admitted to the floor. Contacted the bedside RN and lab at bedside to collect at this time.  ?

## 2021-09-18 ENCOUNTER — Inpatient Hospital Stay: Payer: Medicaid Other | Admitting: Certified Registered"

## 2021-09-18 ENCOUNTER — Inpatient Hospital Stay: Payer: Medicaid Other

## 2021-09-18 ENCOUNTER — Encounter
Admission: EM | Disposition: A | Discharge status: Home / Self Care | Payer: Self-pay | Source: Home / Self Care | Attending: Internal Medicine

## 2021-09-18 ENCOUNTER — Encounter: Payer: Self-pay | Admitting: Family Medicine

## 2021-09-18 DIAGNOSIS — K811 Chronic cholecystitis: Secondary | ICD-10-CM | POA: Diagnosis not present

## 2021-09-18 DIAGNOSIS — K8050 Calculus of bile duct without cholangitis or cholecystitis without obstruction: Secondary | ICD-10-CM | POA: Diagnosis not present

## 2021-09-18 HISTORY — PX: ERCP: SHX5425

## 2021-09-18 LAB — COMPREHENSIVE METABOLIC PANEL
ALT: 556 U/L — ABNORMAL HIGH (ref 0–44)
AST: 597 U/L — ABNORMAL HIGH (ref 15–41)
Albumin: 3.5 g/dL (ref 3.5–5.0)
Alkaline Phosphatase: 158 U/L — ABNORMAL HIGH (ref 38–126)
Anion gap: 6 (ref 5–15)
BUN: 11 mg/dL (ref 6–20)
CO2: 26 mmol/L (ref 22–32)
Calcium: 8.7 mg/dL — ABNORMAL LOW (ref 8.9–10.3)
Chloride: 106 mmol/L (ref 98–111)
Creatinine, Ser: 0.65 mg/dL (ref 0.44–1.00)
GFR, Estimated: >60 mL/min (ref >60)
Glucose, Bld: 85 mg/dL (ref 70–99)
Potassium: 3.4 mmol/L — ABNORMAL LOW (ref 3.5–5.1)
Sodium: 138 mmol/L (ref 135–145)
Total Bilirubin: 2.8 mg/dL — ABNORMAL HIGH (ref 0.3–1.2)
Total Protein: 6.5 g/dL (ref 6.5–8.1)

## 2021-09-18 LAB — CBC
HCT: 37.6 % (ref 36.0–46.0)
Hemoglobin: 12 g/dL (ref 12.0–15.0)
MCH: 28.6 pg (ref 26.0–34.0)
MCHC: 31.9 g/dL (ref 30.0–36.0)
MCV: 89.7 fL (ref 80.0–100.0)
Platelets: 264 10*3/uL (ref 150–400)
RBC: 4.19 MIL/uL (ref 3.87–5.11)
RDW: 14 % (ref 11.5–15.5)
WBC: 7.9 10*3/uL (ref 4.0–10.5)
nRBC: 0 % (ref 0.0–0.2)

## 2021-09-18 LAB — HIV ANTIBODY (ROUTINE TESTING W REFLEX): HIV Screen 4th Generation wRfx: NONREACTIVE

## 2021-09-18 SURGERY — ERCP, WITH INTERVENTION IF INDICATED
Anesthesia: General

## 2021-09-18 MED ORDER — MIDAZOLAM HCL 2 MG/2ML IJ SOLN
INTRAMUSCULAR | Status: DC | PRN
  Administered 2021-09-18: 2 mg via INTRAVENOUS

## 2021-09-18 MED ORDER — PROPOFOL 10 MG/ML IV BOLUS
INTRAVENOUS | Status: DC | PRN
Start: 2021-09-18 — End: 2021-09-18
  Administered 2021-09-18: 50 mg via INTRAVENOUS
  Administered 2021-09-18: 140 mg via INTRAVENOUS

## 2021-09-18 MED ORDER — GLYCOPYRROLATE 0.2 MG/ML IJ SOLN
INTRAMUSCULAR | Status: DC | PRN
Start: 2021-09-18 — End: 2021-09-18
  Administered 2021-09-18: .2 mg via INTRAVENOUS

## 2021-09-18 MED ORDER — MIDAZOLAM HCL 2 MG/2ML IJ SOLN
INTRAMUSCULAR | Status: AC
  Filled 2021-09-18: qty 2

## 2021-09-18 MED ORDER — KETOROLAC TROMETHAMINE 15 MG/ML IJ SOLN
15.0000 mg | Freq: Four times a day (QID) | INTRAMUSCULAR | Status: DC | PRN
  Administered 2021-09-18: 15 mg via INTRAVENOUS
  Filled 2021-09-18: qty 1

## 2021-09-18 MED ORDER — DEXMEDETOMIDINE (PRECEDEX) IN NS 20 MCG/5ML (4 MCG/ML) IV SYRINGE
PREFILLED_SYRINGE | INTRAVENOUS | Status: DC | PRN
  Administered 2021-09-18: 12 ug via INTRAVENOUS

## 2021-09-18 MED ORDER — SUCCINYLCHOLINE CHLORIDE 200 MG/10ML IV SOSY
PREFILLED_SYRINGE | INTRAVENOUS | Status: AC
  Filled 2021-09-18: qty 10

## 2021-09-18 MED ORDER — SUCCINYLCHOLINE CHLORIDE 200 MG/10ML IV SOSY
PREFILLED_SYRINGE | INTRAVENOUS | Status: DC | PRN
  Administered 2021-09-18: 100 mg via INTRAVENOUS

## 2021-09-18 MED ORDER — DEXAMETHASONE SODIUM PHOSPHATE 10 MG/ML IJ SOLN
INTRAMUSCULAR | Status: AC
  Filled 2021-09-18: qty 1

## 2021-09-18 MED ORDER — FENTANYL CITRATE (PF) 100 MCG/2ML IJ SOLN
INTRAMUSCULAR | Status: AC
  Filled 2021-09-18: qty 2

## 2021-09-18 MED ORDER — ONDANSETRON HCL 4 MG/2ML IJ SOLN
INTRAMUSCULAR | Status: AC
  Filled 2021-09-18: qty 2

## 2021-09-18 MED ORDER — LIDOCAINE HCL (CARDIAC) PF 100 MG/5ML IV SOSY
PREFILLED_SYRINGE | INTRAVENOUS | Status: DC | PRN
  Administered 2021-09-18: 30 mg via INTRAVENOUS

## 2021-09-18 MED ORDER — PROPOFOL 500 MG/50ML IV EMUL
INTRAVENOUS | Status: DC | PRN
  Administered 2021-09-18: 120 ug/kg/min via INTRAVENOUS

## 2021-09-18 MED ORDER — LIDOCAINE HCL (PF) 2 % IJ SOLN
INTRAMUSCULAR | Status: AC
  Filled 2021-09-18: qty 5

## 2021-09-18 MED ORDER — ONDANSETRON HCL 4 MG/2ML IJ SOLN
INTRAMUSCULAR | Status: DC | PRN
Start: 2021-09-18 — End: 2021-09-18
  Administered 2021-09-18: 4 mg via INTRAVENOUS

## 2021-09-18 MED ORDER — PROPOFOL 10 MG/ML IV BOLUS
INTRAVENOUS | Status: AC
  Filled 2021-09-18: qty 20

## 2021-09-18 MED ORDER — LACTATED RINGERS IV SOLN
INTRAVENOUS | Status: DC
Start: 2021-09-18 — End: 2021-09-18
  Administered 2021-09-18: 1000 mL via INTRAVENOUS

## 2021-09-18 MED ORDER — GLYCOPYRROLATE 0.2 MG/ML IJ SOLN
INTRAMUSCULAR | Status: AC
  Filled 2021-09-18: qty 1

## 2021-09-18 MED ORDER — INDOMETHACIN 50 MG RE SUPP
RECTAL | Status: AC
  Filled 2021-09-18: qty 2

## 2021-09-18 MED ORDER — DEXAMETHASONE SODIUM PHOSPHATE 10 MG/ML IJ SOLN
INTRAMUSCULAR | Status: DC | PRN
  Administered 2021-09-18: 10 mg via INTRAVENOUS

## 2021-09-18 MED ORDER — INDOMETHACIN 50 MG RE SUPP
100.0000 mg | Freq: Once | RECTAL | Status: AC
  Administered 2021-09-18: 100 mg via RECTAL

## 2021-09-18 MED ORDER — FENTANYL CITRATE (PF) 100 MCG/2ML IJ SOLN
INTRAMUSCULAR | Status: DC | PRN
  Administered 2021-09-18 (×2): 50 ug via INTRAVENOUS

## 2021-09-18 MED ORDER — PHENYLEPHRINE HCL (PRESSORS) 10 MG/ML IV SOLN
INTRAVENOUS | Status: DC | PRN
Start: 2021-09-18 — End: 2021-09-18
  Administered 2021-09-18: 160 ug via INTRAVENOUS

## 2021-09-18 NOTE — H&P (View-Only) (Signed)
Patient ID: Robin Campbell, female   DOB: 20-Sep-2000, 20 y.o.   MRN: [id number]   SURGICAL CONSULTATION NOTE   HISTORY OF PRESENT ILLNESS (HPI):  21 y.o. female presented to [facility] ED for evaluation of abdominal pain. Patient reports she has been having abdominal pain for the last few weeks.  Pain is on the right side of the abdomen.  She cannot identify any alleviating or aggravating factors.  No pain radiation.  Due to worsening of the pain she decided to come to the emergency room.  At the ED she was found with pain on the right side of the abdomen.  Physical exam with mild tenderness on palpation.  Labs shows elevated liver function test but normal bilirubin and alkaline phosphatase.  This led to abdominal ultrasound that shows dilated common bile duct.  Evaluation with MRCP shows small stone in the common bile duct.  I personally evaluated the images of the abdominal ultrasound and MRCP.  Patient was admitted for management of choledocholithiasis.  Patient had ERCP today.  ERCP shows choledocholithiasis.  This was clear by swiping the common bile duct with balloon extraction and sphincterotomy.  I personally dilated the images of the ERCP.  Surgery is consulted by Dr. Reesa Chew in this context for evaluation and management of choledocholithiasis.  PAST MEDICAL HISTORY ([facility]):  Past Medical History:  Diagnosis Date   Medical history non-contributory      PAST SURGICAL HISTORY ([place]):  Past Surgical History:  Procedure Laterality Date   NO PAST SURGERIES       MEDICATIONS:  Prior to Admission medications   Medication Sig Start Date End Date Taking? Authorizing Provider  norethindrone (ORTHO MICRONOR) 0.35 MG tablet Take 1 tablet (0.35 mg total) by mouth daily. Patient not taking: Reported on 09/15/2021 08/12/21   DominicNunzio Cobbs, CNM  Prenatal Vit-Fe Fumarate-FA (PRENATAL MULTIVITAMIN) TABS tablet Take 1 tablet by mouth daily at 12 noon. Patient not taking: Reported on 09/18/2021 07/29/21    Dominic, Nunzio Cobbs, CNM     ALLERGIES:  No Known Allergies   SOCIAL HISTORY:  Social History   Socioeconomic History   Marital status: Single    Spouse name: Not on file   Number of children: Not on file   Years of education: Not on file   Highest education level: Not on file  Occupational History   Occupation: Retail  Tobacco Use   Smoking status: Never    Passive exposure: Never   Smokeless tobacco: Never  Vaping Use   Vaping Use: Never used  Substance and Sexual Activity   Alcohol use: Not Currently   Drug use: Never   Sexual activity: Yes    Partners: Male    Birth control/protection: None, Condom  Other Topics Concern   Not on file  Social History Narrative   Not on file   Social Determinants of Health   Financial Resource Strain: Not on file  Food Insecurity: Not on file  Transportation Needs: Not on file  Physical Activity: Not on file  Stress: Not on file  Social Connections: Not on file  Intimate Partner Violence: Not on file      FAMILY HISTORY:  Family History  Problem Relation Age of Onset   Colon cancer Father        6s   Throat cancer Father      REVIEW OF SYSTEMS:  Constitutional: denies weight loss, fever, chills, or sweats  Eyes: denies any other vision changes, history of eye injury  ENT:  denies sore throat, hearing problems  Respiratory: denies shortness of breath, wheezing  Cardiovascular: denies chest pain, palpitations  Gastrointestinal: Positive abdominal pain, nausea and vomiting Genitourinary: denies burning with urination or urinary frequency Musculoskeletal: denies any other joint pains or cramps  Skin: denies any other rashes or skin discolorations  Neurological: denies any other headache, dizziness, weakness  Psychiatric: denies any other depression, anxiety   All other review of systems were negative   VITAL SIGNS:  Temp:  [97.1 F (36.2 C)-98.5 F (36.9 C)] 98.2 F (36.8 C) (05/18 1528) Pulse Rate:  [53-105]  65 (05/18 1528) Resp:  [16-23] 20 (05/18 1528) BP: (94-127)/(56-99) 99/67 (05/18 1528) SpO2:  [94 %-100 %] 99 % (05/18 1528) Weight:  [65 kg] 65 kg (05/17 2050)     Height: 5\' 3"  (160 cm) Weight: 65 kg BMI (Calculated): 25.41   INTAKE/OUTPUT:  This shift: Total I/O In: 200 [I.V.:200] Out: -   Last 2 shifts: @IOLAST2SHIFTS @   PHYSICAL EXAM:  Constitutional:  -- Normal body habitus  -- Awake, alert, and oriented x3  Eyes:  -- Pupils equally round and reactive to light  -- No scleral icterus  Ear, nose, and throat:  -- No jugular venous distension  Pulmonary:  -- No crackles  -- Equal breath sounds bilaterally -- Breathing non-labored at rest Cardiovascular:  -- S1, S2 present  -- No pericardial rubs Gastrointestinal:  -- Abdomen soft, nontender, non-distended, no guarding or rebound tenderness -- No abdominal masses appreciated, pulsatile or otherwise  Musculoskeletal and Integumentary:  -- Wounds: None appreciated -- Extremities: B/L UE and LE FROM, hands and feet warm, no edema  Neurologic:  -- Motor function: intact and symmetric -- Sensation: intact and symmetric   Labs:     Latest Ref Rng & Units 09/18/2021    4:42 AM 09/17/2021    1:41 PM 07/28/2021    7:30 AM  CBC  WBC 4.0 - 10.5 K/uL 7.9   18.6   15.5    Hemoglobin 12.0 - 15.0 g/dL 12.0   12.6   9.5    Hematocrit 36.0 - 46.0 % 37.6   40.1   30.1    Platelets 150 - 400 K/uL 264   310   277        Latest Ref Rng & Units 09/18/2021    4:42 AM 09/17/2021    1:41 PM 01/15/2021    7:45 AM  CMP  Glucose 70 - 99 mg/dL 85   108   113    BUN 6 - 20 mg/dL 11   15   <5    Creatinine 0.44 - 1.00 mg/dL 0.65   0.60   0.31    Sodium 135 - 145 mmol/L 138   142   137    Potassium 3.5 - 5.1 mmol/L 3.4   3.4   3.6    Chloride 98 - 111 mmol/L 106   110   104    CO2 22 - 32 mmol/L 26   24   26     Calcium 8.9 - 10.3 mg/dL 8.7   8.6   8.4    Total Protein 6.5 - 8.1 g/dL 6.5   7.0   5.7    Total Bilirubin 0.3 - 1.2 mg/dL 2.8    0.8   0.7    Alkaline Phos 38 - 126 U/L 158   110   66    AST 15 - 41 U/L 597   132   42  ALT 0 - 44 U/L 556   69   132      Imaging studies:  EXAM: MRI ABDOMEN WITHOUT AND WITH CONTRAST (INCLUDING MRCP)   TECHNIQUE: Multiplanar multisequence MR imaging of the abdomen was performed both before and after the administration of intravenous contrast. Heavily T2-weighted images of the biliary and pancreatic ducts were obtained, and three-dimensional MRCP images were rendered by post processing.   CONTRAST:  70mL GADAVIST GADOBUTROL 1 MMOL/ML IV SOLN   COMPARISON:  Ultrasound on 09/17/2021   FINDINGS: Lower chest: No acute findings.   Hepatobiliary: No hepatic masses identified. Multiple tiny gallstones are seen, however there is no evidence of cholecystitis. Mild biliary ductal dilatation is seen, with common bile duct measuring 7 mm. A single tiny less than 5 mm calculus is seen in the distal common bile duct (e.g. Image 23/13).   Pancreas: No mass or inflammatory changes. No evidence of pancreatic ductal dilatation.   Spleen:  Within normal limits in size and appearance.   Adrenals/Urinary Tract: No masses identified. No evidence of hydronephrosis.   Stomach/Bowel: Unremarkable.   Vascular/Lymphatic: No pathologically enlarged lymph nodes identified. No acute vascular findings.   Other:  None.   Musculoskeletal:  No suspicious bone lesions identified.   IMPRESSION: Cholelithiasis. No radiographic evidence of cholecystitis.   Mild biliary ductal dilatation, with tiny less than 5 mm calculus in distal common bile duct.     Electronically Signed   By: Marlaine Hind M.D.   On: 09/17/2021 18:53  Assessment/Plan:  21 y.o. female with choledocholithiasis.  Patient was admitted about diagnosis of choledocholithiasis.  Patient already had ERCP.  I discussed with patient recommendation of cholecystectomy to decrease the risk of recurrence.  I discussed with patient  the surgical technique of minimal invasive laparoscopic cholecystectomy.  I discussed with patient the risk of surgery that includes infection, bleeding, injury to adjacent organs such as, bile duct, biliary leak, perforation of the intestine, intra-abdominal infections, risk of anesthesia, among others.  The patient reports she understood and agreed to proceed with cholecystectomy.  We will put her n.p.o. after midnight tonight and schedule her for cholecystectomy tomorrow.  Arnold Long, MD

## 2021-09-18 NOTE — Assessment & Plan Note (Addendum)
S/p ERCP with sphincterotomy and removal of bile stones. General surgery was also consulted-we will appreciate their help. -Monitor for any perforation or signs of cholangitis after the procedure. -Continue with pain management -Continue supportive care -Continue with Zosyn

## 2021-09-18 NOTE — Anesthesia Preprocedure Evaluation (Signed)
Anesthesia Evaluation  Patient identified by MRN, date of birth, ID band Patient awake    Reviewed: Allergy & Precautions, H&P , NPO status , Patient's Chart, lab work & pertinent test results, reviewed documented beta blocker date and time   History of Anesthesia Complications Negative for: history of anesthetic complications  Airway Mallampati: III  TM Distance: >3 FB Neck ROM: full    Dental  (+) Dental Advidsory Given, Teeth Intact   Pulmonary neg pulmonary ROS,    Pulmonary exam normal breath sounds clear to auscultation       Cardiovascular Exercise Tolerance: Good negative cardio ROS Normal cardiovascular exam Rhythm:regular Rate:Normal     Neuro/Psych negative neurological ROS  negative psych ROS   GI/Hepatic GERD  ,NAFLD   Endo/Other  negative endocrine ROS  Renal/GU negative Renal ROS  negative genitourinary   Musculoskeletal   Abdominal   Peds  Hematology negative hematology ROS (+)   Anesthesia Other Findings Past Medical History: No date: Medical history non-contributory   Reproductive/Obstetrics (+) Breast feeding                              Anesthesia Physical  Anesthesia Plan  ASA: 2  Anesthesia Plan: General   Post-op Pain Management:    Induction: Intravenous  PONV Risk Score and Plan: 3 and Ondansetron, Dexamethasone, Midazolam and Treatment may vary due to age or medical condition  Airway Management Planned: Oral ETT  Additional Equipment:   Intra-op Plan:   Post-operative Plan: Extubation in OR  Informed Consent: I have reviewed the patients History and Physical, chart, labs and discussed the procedure including the risks, benefits and alternatives for the proposed anesthesia with the patient or authorized representative who has indicated his/her understanding and acceptance.     Dental Advisory Given  Plan Discussed with: Anesthesiologist,  CRNA and Surgeon  Anesthesia Plan Comments:         Anesthesia Quick Evaluation

## 2021-09-18 NOTE — Anesthesia Procedure Notes (Signed)
Procedure Name: Intubation Date/Time: 09/18/2021 12:36 PM Performed by: Rolla Plate, CRNA Pre-anesthesia Checklist: Patient identified, Patient being monitored, Timeout performed, Emergency Drugs available and Suction available Patient Re-evaluated:Patient Re-evaluated prior to induction Oxygen Delivery Method: Circle system utilized Preoxygenation: Pre-oxygenation with 100% oxygen Induction Type: IV induction and Rapid sequence Laryngoscope Size: Mac and 3 Grade View: Grade I Tube type: Oral Tube size: 6.5 mm Number of attempts: 1 Airway Equipment and Method: Stylet and Video-laryngoscopy Placement Confirmation: ETT inserted through vocal cords under direct vision, positive ETCO2 and breath sounds checked- equal and bilateral Secured at: 20 cm Tube secured with: Tape Dental Injury: Teeth and Oropharynx as per pre-operative assessment

## 2021-09-18 NOTE — Assessment & Plan Note (Addendum)
Worsening of liver enzyme, now elevated levels of alkaline phosphatase and T. bili at 2.8, more consistent with obstructive picture. Anticipating improvement after ERCP. -Monitor liver function -Continue with supportive care

## 2021-09-18 NOTE — TOC Initial Note (Signed)
Transition of Care [facility]) - Initial/Assessment Note    Patient Details  Name: Robin Campbell MRN: [id number] Date of Birth: 21-May-2000  Transition of Care [facility]) CM/SW Contact:    Chapman Fitch, RN Phone Number: 09/18/2021, 2:46 PM  Clinical Narrative:                  Transition of Care (TOC) Screening Note   Patient Details  Name: Robin Campbell Date of Birth: Apr 16, 2001   Transition of Care [facility]) CM/SW Contact:    Chapman Fitch, RN Phone Number: 09/18/2021, 2:46 PM    Transition of Care Department [facility]) has reviewed patient and no TOC needs have been identified at this time. We will continue to monitor patient advancement through interdisciplinary progression rounds. If new patient transition needs arise, please place a TOC consult.          Patient Goals and CMS Choice        Expected Discharge Plan and Services                                                Prior Living Arrangements/Services                       Activities of Daily Living Home Assistive Devices/Equipment: None ADL Screening (condition at time of admission) Patient's cognitive ability adequate to safely complete daily activities?: Yes Is the patient deaf or have difficulty hearing?: No Does the patient have difficulty seeing, even when wearing glasses/contacts?: No Does the patient have difficulty concentrating, remembering, or making decisions?: No Patient able to express need for assistance with ADLs?: Yes Does the patient have difficulty dressing or bathing?: No Independently performs ADLs?: Yes (appropriate for developmental age) Does the patient have difficulty walking or climbing stairs?: No Weakness of Legs: None Weakness of Arms/Hands: None  Permission Sought/Granted                  Emotional Assessment              Admission diagnosis:  Choledocholithiasis [K80.50] Calculus of gallbladder with biliary obstruction but without  cholecystitis [K80.21] Patient Active Problem List   Diagnosis Date Noted   Choledocholithiasis 09/17/2021   Postpartum care following vaginal delivery 07/27/2021   Encounter for care or examination of lactating mother 07/27/2021   Hypokalemia 01/15/2021   Elevated transaminase level 01/15/2021   Encounter for supervision of normal first pregnancy in third trimester 12/11/2020   PCP:  Pcp, No Pharmacy:   [id number] [place], [place] - [address] [place] [address] Phone: [phone] Fax: [phone]     Social Determinants of Health (SDOH) Interventions    Readmission Risk Interventions     View : No data to display.

## 2021-09-18 NOTE — Progress Notes (Signed)
Patient ID: Robin Campbell, female   DOB: 06/09/2000, 21 y.o.   MRN: [id number]   SURGICAL CONSULTATION NOTE   HISTORY OF PRESENT ILLNESS (HPI):  21 y.o. female presented to [facility] ED for evaluation of abdominal pain. Patient reports she has been having abdominal pain for the last few weeks.  Pain is on the right side of the abdomen.  She cannot identify any alleviating or aggravating factors.  No pain radiation.  Due to worsening of the pain she decided to come to the emergency room.  At the ED she was found with pain on the right side of the abdomen.  Physical exam with mild tenderness on palpation.  Labs shows elevated liver function test but normal bilirubin and alkaline phosphatase.  This led to abdominal ultrasound that shows dilated common bile duct.  Evaluation with MRCP shows small stone in the common bile duct.  I personally evaluated the images of the abdominal ultrasound and MRCP.  Patient was admitted for management of choledocholithiasis.  Patient had ERCP today.  ERCP shows choledocholithiasis.  This was clear by swiping the common bile duct with balloon extraction and sphincterotomy.  I personally dilated the images of the ERCP.  Surgery is consulted by Dr. Amin in this context for evaluation and management of choledocholithiasis.  PAST MEDICAL HISTORY (PMH):  Past Medical History:  Diagnosis Date   Medical history non-contributory      PAST SURGICAL HISTORY (PSH):  Past Surgical History:  Procedure Laterality Date   NO PAST SURGERIES       MEDICATIONS:  Prior to Admission medications   Medication Sig Start Date End Date Taking? Authorizing Provider  norethindrone (ORTHO MICRONOR) 0.35 MG tablet Take 1 tablet (0.35 mg total) by mouth daily. Patient not taking: Reported on 09/15/2021 08/12/21   Dominic, Lydia Marie, CNM  Prenatal Vit-Fe Fumarate-FA (PRENATAL MULTIVITAMIN) TABS tablet Take 1 tablet by mouth daily at 12 noon. Patient not taking: Reported on 09/18/2021 07/29/21    Dominic, Lydia Marie, CNM     ALLERGIES:  No Known Allergies   SOCIAL HISTORY:  Social History   Socioeconomic History   Marital status: Single    Spouse name: Not on file   Number of children: Not on file   Years of education: Not on file   Highest education level: Not on file  Occupational History   Occupation: Retail  Tobacco Use   Smoking status: Never    Passive exposure: Never   Smokeless tobacco: Never  Vaping Use   Vaping Use: Never used  Substance and Sexual Activity   Alcohol use: Not Currently   Drug use: Never   Sexual activity: Yes    Partners: Male    Birth control/protection: None, Condom  Other Topics Concern   Not on file  Social History Narrative   Not on file   Social Determinants of Health   Financial Resource Strain: Not on file  Food Insecurity: Not on file  Transportation Needs: Not on file  Physical Activity: Not on file  Stress: Not on file  Social Connections: Not on file  Intimate Partner Violence: Not on file      FAMILY HISTORY:  Family History  Problem Relation Age of Onset   Colon cancer Father        60s   Throat cancer Father      REVIEW OF SYSTEMS:  Constitutional: denies weight loss, fever, chills, or sweats  Eyes: denies any other vision changes, history of eye injury  ENT:   denies sore throat, hearing problems  Respiratory: denies shortness of breath, wheezing  Cardiovascular: denies chest pain, palpitations  Gastrointestinal: Positive abdominal pain, nausea and vomiting Genitourinary: denies burning with urination or urinary frequency Musculoskeletal: denies any other joint pains or cramps  Skin: denies any other rashes or skin discolorations  Neurological: denies any other headache, dizziness, weakness  Psychiatric: denies any other depression, anxiety   All other review of systems were negative   VITAL SIGNS:  Temp:  [97.1 F (36.2 C)-98.5 F (36.9 C)] 98.2 F (36.8 C) (05/18 1528) Pulse Rate:  [53-105]  65 (05/18 1528) Resp:  [16-23] 20 (05/18 1528) BP: (94-127)/(56-99) 99/67 (05/18 1528) SpO2:  [94 %-100 %] 99 % (05/18 1528) Weight:  [65 kg] 65 kg (05/17 2050)     Height: 5' 3" (160 cm) Weight: 65 kg BMI (Calculated): 25.41   INTAKE/OUTPUT:  This shift: Total I/O In: 200 [I.V.:200] Out: -   Last 2 shifts: @IOLAST2SHIFTS@   PHYSICAL EXAM:  Constitutional:  -- Normal body habitus  -- Awake, alert, and oriented x3  Eyes:  -- Pupils equally round and reactive to light  -- No scleral icterus  Ear, nose, and throat:  -- No jugular venous distension  Pulmonary:  -- No crackles  -- Equal breath sounds bilaterally -- Breathing non-labored at rest Cardiovascular:  -- S1, S2 present  -- No pericardial rubs Gastrointestinal:  -- Abdomen soft, nontender, non-distended, no guarding or rebound tenderness -- No abdominal masses appreciated, pulsatile or otherwise  Musculoskeletal and Integumentary:  -- Wounds: None appreciated -- Extremities: B/L UE and LE FROM, hands and feet warm, no edema  Neurologic:  -- Motor function: intact and symmetric -- Sensation: intact and symmetric   Labs:     Latest Ref Rng & Units 09/18/2021    4:42 AM 09/17/2021    1:41 PM 07/28/2021    7:30 AM  CBC  WBC 4.0 - 10.5 K/uL 7.9   18.6   15.5    Hemoglobin 12.0 - 15.0 g/dL 12.0   12.6   9.5    Hematocrit 36.0 - 46.0 % 37.6   40.1   30.1    Platelets 150 - 400 K/uL 264   310   277        Latest Ref Rng & Units 09/18/2021    4:42 AM 09/17/2021    1:41 PM 01/15/2021    7:45 AM  CMP  Glucose 70 - 99 mg/dL 85   108   113    BUN 6 - 20 mg/dL 11   15   <5    Creatinine 0.44 - 1.00 mg/dL 0.65   0.60   0.31    Sodium 135 - 145 mmol/L 138   142   137    Potassium 3.5 - 5.1 mmol/L 3.4   3.4   3.6    Chloride 98 - 111 mmol/L 106   110   104    CO2 22 - 32 mmol/L 26   24   26    Calcium 8.9 - 10.3 mg/dL 8.7   8.6   8.4    Total Protein 6.5 - 8.1 g/dL 6.5   7.0   5.7    Total Bilirubin 0.3 - 1.2 mg/dL 2.8    0.8   0.7    Alkaline Phos 38 - 126 U/L 158   110   66    AST 15 - 41 U/L 597   132   42      ALT 0 - 44 U/L 556   69   132      Imaging studies:  EXAM: MRI ABDOMEN WITHOUT AND WITH CONTRAST (INCLUDING MRCP)   TECHNIQUE: Multiplanar multisequence MR imaging of the abdomen was performed both before and after the administration of intravenous contrast. Heavily T2-weighted images of the biliary and pancreatic ducts were obtained, and three-dimensional MRCP images were rendered by post processing.   CONTRAST:  7mL GADAVIST GADOBUTROL 1 MMOL/ML IV SOLN   COMPARISON:  Ultrasound on 09/17/2021   FINDINGS: Lower chest: No acute findings.   Hepatobiliary: No hepatic masses identified. Multiple tiny gallstones are seen, however there is no evidence of cholecystitis. Mild biliary ductal dilatation is seen, with common bile duct measuring 7 mm. A single tiny less than 5 mm calculus is seen in the distal common bile duct (e.g. Image 23/13).   Pancreas: No mass or inflammatory changes. No evidence of pancreatic ductal dilatation.   Spleen:  Within normal limits in size and appearance.   Adrenals/Urinary Tract: No masses identified. No evidence of hydronephrosis.   Stomach/Bowel: Unremarkable.   Vascular/Lymphatic: No pathologically enlarged lymph nodes identified. No acute vascular findings.   Other:  None.   Musculoskeletal:  No suspicious bone lesions identified.   IMPRESSION: Cholelithiasis. No radiographic evidence of cholecystitis.   Mild biliary ductal dilatation, with tiny less than 5 mm calculus in distal common bile duct.     Electronically Signed   By: John A Stahl M.D.   On: 09/17/2021 18:53  Assessment/Plan:  21 y.o. female with choledocholithiasis.  Patient was admitted about diagnosis of choledocholithiasis.  Patient already had ERCP.  I discussed with patient recommendation of cholecystectomy to decrease the risk of recurrence.  I discussed with patient  the surgical technique of minimal invasive laparoscopic cholecystectomy.  I discussed with patient the risk of surgery that includes infection, bleeding, injury to adjacent organs such as, bile duct, biliary leak, perforation of the intestine, intra-abdominal infections, risk of anesthesia, among others.  The patient reports she understood and agreed to proceed with cholecystectomy.  We will put her n.p.o. after midnight tonight and schedule her for cholecystectomy tomorrow.  Argentina Kosch Cintrn-Daz, MD  

## 2021-09-18 NOTE — Transfer of Care (Signed)
Immediate Anesthesia Transfer of Care Note  Patient: Robin Campbell  Procedure(s) Performed: ENDOSCOPIC RETROGRADE CHOLANGIOPANCREATOGRAPHY (ERCP)  Patient Location: Endoscopy Unit  Anesthesia Type:General  Level of Consciousness: awake  Airway & Oxygen Therapy: Patient Spontanous Breathing  Post-op Assessment: Report given to RN and Post -op Vital signs reviewed and stable  Post vital signs: Reviewed  Last Vitals:  Vitals Value Taken Time  BP    Temp    Pulse    Resp    SpO2      Last Pain:  Vitals:   09/18/21 0848  TempSrc:   PainSc: 0-No pain         Complications: No notable events documented.

## 2021-09-18 NOTE — Hospital Course (Addendum)
Taken from H&P.  Robin Campbell is a 21 y.o. female with no chronic medical problems, who presented to the emergency room with acute onset of right flank pain radiating to her right upper quadrant which has been intermittent over the last 7 weeks since she delivered her first baby, and got significantly worse over the last couple of weeks occurring every night.  She described it as a colicky pain.  No fever or chills.  She had nausea and vomiting today.  No dysuria oliguria, hematuria or urinary frequency or urgency or flank pain.   ED Course: Upon presentation to the emergency room, vital signs revealed a blood pressure of 97/59 and later 110/64 and heart rate was 98 with otherwise normal vital signs.  Labs revealed mild hypokalemia and AST 132 with ALT of 69.  CBC showed leukocytosis of 18.6.   Imaging: Right upper quadrant ultrasound revealed the following: Cholelithiasis without acute cholecystitis. Question small RIGHT hepatic lesion likely hemangioma or periportal fat.  MRCP revealed cholelithiasis without evidence of cholecystitis.  It showed mild biliary ductal dilatation with tiny less than 5 mm calculus in the distal common bile duct.  The patient was given 1 mg of IV Dilaudid, 1 L bolus of IV lactated Ringer and 3.375 g of IV Zosyn.  She will be admitted to a medical bed for further evaluation and management.  5/18: Patient is feeling much improved and waiting for ERCP when seen during morning rounds. Worsening of liver enzymes, now elevated alkaline phosphatase and T. bili, picture more consistent with obstruction. Patient underwent successful ERCP with GI later today, s/p sphincterotomy and removal of bile stones. General surgery was also consulted for evaluation, they will decide about cholecystectomy either during current hospitalization or later as elective. Patient is currently breast-feeding.  5/19: Patient with decreasing AST and ALT after the ERCP, T. bili has been  normalized, slight worsening of alkaline phosphatase.  Going for laparoscopic cholecystectomy today. No definitive source of infection at this time so sepsis ruled out.  5/20: Patient underwent successful laparoscopic cholecystectomy yesterday.  Tolerated the procedure well.  No complications noted.  Labs seems stable and improving.  Pain tolerable with current regimen.  Patient is being discharged home to follow-up with general surgery as an outpatient for further recommendations. Patient was given some Percocet and Zofran with instructions to avoid breast-feeding while taking them.

## 2021-09-18 NOTE — Progress Notes (Signed)
Progress Note   Patient: Robin Campbell DQQ:[id number] DOB: 2000/06/13 DOA: 09/17/2021     1 DOS: the patient was seen and examined on 09/18/2021   Brief hospital course: Taken from H&P.  Robin Campbell is a 21 y.o. female with no chronic medical problems, who presented to the emergency room with acute onset of right flank pain radiating to her right upper quadrant which has been intermittent over the last 7 weeks since she delivered her first baby, and got significantly worse over the last couple of weeks occurring every night.  She described it as a colicky pain.  No fever or chills.  She had nausea and vomiting today.  No dysuria oliguria, hematuria or urinary frequency or urgency or flank pain.   ED Course: Upon presentation to the emergency room, vital signs revealed a blood pressure of 97/59 and later 110/64 and heart rate was 98 with otherwise normal vital signs.  Labs revealed mild hypokalemia and AST 132 with ALT of 69.  CBC showed leukocytosis of 18.6.   Imaging: Right upper quadrant ultrasound revealed the following: Cholelithiasis without acute cholecystitis. Question small RIGHT hepatic lesion likely hemangioma or periportal fat.  MRCP revealed cholelithiasis without evidence of cholecystitis.  It showed mild biliary ductal dilatation with tiny less than 5 mm calculus in the distal common bile duct.  The patient was given 1 mg of IV Dilaudid, 1 L bolus of IV lactated Ringer and 3.375 g of IV Zosyn.  She will be admitted to a medical bed for further evaluation and management.  5/18: Patient is feeling much improved and waiting for ERCP when seen during morning rounds. Worsening of liver enzymes, now elevated alkaline phosphatase and T. bili, picture more consistent with obstruction. Patient underwent successful ERCP with GI later today, s/p sphincterotomy and removal of bile stones. General surgery was also consulted for evaluation, they will decide about cholecystectomy either  during current hospitalization or later as elective. Patient is currently breast-feeding.   Assessment and Plan: * Choledocholithiasis S/p ERCP with sphincterotomy and removal of bile stones. General surgery was also consulted-we will appreciate their help. -Monitor for any perforation or signs of cholangitis after the procedure. -Continue with pain management -Continue supportive care -Continue with Zosyn  Elevated transaminase level Worsening of liver enzyme, now elevated levels of alkaline phosphatase and T. bili at 2.8, more consistent with obstructive picture. Anticipating improvement after ERCP. -Monitor liver function -Continue with supportive care  Hypokalemia Some improvement in potassium but still borderline low at 3.4 today. -Replace potassium and monitor   Subjective: Patient was seen and examined today.  Continues to have mild right flank pain, nausea and vomiting has improved.  Physical Exam: Vitals:   09/18/21 1254 09/18/21 1304 09/18/21 1314 09/18/21 1324  BP: 97/60 (!) 94/56 101/64 102/64  Pulse: (!) 105     Resp: (!) 23 18  19   Temp:    (!) 97.1 F (36.2 C)  TempSrc:    Temporal  SpO2: 99% 100% 100% 100%  Weight:      Height:       General.  Well-developed young lady, in no acute distress. Pulmonary.  Lungs clear bilaterally, normal respiratory effort. CV.  Regular rate and rhythm, no JVD, rub or murmur. Abdomen.  Soft, mild RUQ tenderness, nondistended, BS positive. CNS.  Alert and oriented .  No focal neurologic deficit. Extremities.  No edema, no cyanosis, pulses intact and symmetrical. Psychiatry.  Judgment and insight appears normal.  Data Reviewed: Prior notes, labs and  images reviewed  Family Communication: Discussed with patient  Disposition: Status is: Inpatient Remains inpatient appropriate because: Severity of illness   Planned Discharge Destination: Home  Time spent: 45 minutes  This record has been created using Software engineer. Errors have been sought and corrected,but may not always be located. Such creation errors do not reflect on the standard of care.  Author: Arnetha Courser, MD 09/18/2021 3:01 PM  For on call review [email or web address].

## 2021-09-18 NOTE — Op Note (Signed)
[facility] Patient Name: Robin Campbell Procedure Date: 09/18/2021 12:02 PM MRN: [id number] Account #: [id number] Date of Birth: 2001-03-19 Admit Type: Inpatient Age: 21 Room: [facility] ENDO ROOM 4 Gender: Female Note Status: Finalized Instrument Name: EXALT Ercp scope Procedure:             ERCP Indications:           Common bile duct stone(s) Providers:             Lucilla Lame MD, MD Referring MD:          No Local Md, MD (Referring MD) Medicines:             Propofol per Anesthesia Complications:         No immediate complications. Procedure:             Pre-Anesthesia Assessment:                        - Prior to the procedure, a History and Physical was                         performed, and patient medications and allergies were                         reviewed. The patient's tolerance of previous                         anesthesia was also reviewed. The risks and benefits                         of the procedure and the sedation options and risks                         were discussed with the patient. All questions were                         answered, and informed consent was obtained. Prior                         Anticoagulants: The patient has taken no previous                         anticoagulant or antiplatelet agents. ASA Grade                         Assessment: II - A patient with mild systemic disease.                         After reviewing the risks and benefits, the patient                         was deemed in satisfactory condition to undergo the                         procedure.                        After obtaining informed consent, the scope was passed  under direct vision. Throughout the procedure, the                         patient's blood pressure, pulse, and oxygen                         saturations were monitored continuously. The [employer] D  single use duodenoscope was                         introduced through the mouth, and used to inject                         contrast into and used to inject contrast into the                         bile duct. The ERCP was accomplished without                         difficulty. The patient tolerated the procedure well. Findings:      The scout film was normal. The esophagus was successfully intubated       under direct vision. The scope was advanced to a normal major papilla in       the descending duodenum without detailed examination of the pharynx,       larynx and associated structures, and upper GI tract. The upper GI tract       was grossly normal. The bile duct was deeply cannulated with the       short-nosed traction sphincterotome. Contrast was injected. I personally       interpreted the bile duct images. There was brisk flow of contrast       through the ducts. Image quality was excellent. Contrast extended to the       entire biliary tree. A wire was passed into the biliary tree. A 7 mm       biliary sphincterotomy was made with a traction (standard)       sphincterotome using ERBE electrocautery. There was no       post-sphincterotomy bleeding. The biliary tree was swept with a 15 mm       balloon starting at the bifurcation. All stones were removed. Impression:            - Choledocholithiasis was found. Complete removal was                         accomplished by biliary sphincterotomy and balloon                         extraction.                        - A biliary sphincterotomy was performed.                        - The biliary tree was swept. Recommendation:        - Return patient to hospital ward for ongoing care.                        -  Clear liquid diet.                        - Watch for pancreatitis, bleeding, perforation, and                         cholangitis. Procedure Code(s):     --- Professional ---                        [phone], Endoscopic retrograde  cholangiopancreatography                         (ERCP); with removal of calculi/debris from                         biliary/pancreatic duct(s)                        43262, Endoscopic retrograde cholangiopancreatography                         (ERCP); with sphincterotomy/papillotomy                        [phone], Endoscopic catheterization of the biliary                         ductal system, radiological supervision and                         interpretation Diagnosis Code(s):     --- Professional ---                        K80.50, Calculus of bile duct without cholangitis or                         cholecystitis without obstruction CPT copyright 2019 American Medical Association. All rights reserved. The codes documented in this report are preliminary and upon coder review may  be revised to meet current compliance requirements. Lucilla Lame MD, MD 09/18/2021 12:49:22 PM This report has been signed electronically. Number of Addenda: 0 Note Initiated On: 09/18/2021 12:02 PM Estimated Blood Loss:  Estimated blood loss: none.      [facility]

## 2021-09-18 NOTE — Assessment & Plan Note (Addendum)
Some improvement in potassium but still borderline low at 3.4 today. -Replace potassium and monitor

## 2021-09-18 NOTE — TOC Initial Note (Signed)
Transition of Care [facility]) - Initial/Assessment Note    Patient Details  Name: Robin Campbell MRN: [id number] Date of Birth: [birth year]  Transition of Care [facility]) CM/SW Contact:    Beverly Sessions, RN Phone Number: 09/18/2021, 10:04 AM  Clinical Narrative:                  Transition of Care (TOC) Screening Note   Patient Details  Name: Robin Campbell Date of Birth: 2001/03/29   Transition of Care [facility]) CM/SW Contact:    Beverly Sessions, RN Phone Number: 09/18/2021, 10:04 AM    Transition of Care Department (TOC) has reviewed patient and no TOC needs have been identified at this time. We will continue to monitor patient advancement through interdisciplinary progression rounds. If new patient transition needs arise, please place a TOC consult.          Patient Goals and CMS Choice        Expected Discharge Plan and Services                                                Prior Living Arrangements/Services                       Activities of Daily Living Home Assistive Devices/Equipment: None ADL Screening (condition at time of admission) Patient's cognitive ability adequate to safely complete daily activities?: Yes Is the patient deaf or have difficulty hearing?: No Does the patient have difficulty seeing, even when wearing glasses/contacts?: No Does the patient have difficulty concentrating, remembering, or making decisions?: No Patient able to express need for assistance with ADLs?: Yes Does the patient have difficulty dressing or bathing?: No Independently performs ADLs?: Yes (appropriate for developmental age) Does the patient have difficulty walking or climbing stairs?: No Weakness of Legs: None Weakness of Arms/Hands: None  Permission Sought/Granted                  Emotional Assessment              Admission diagnosis:  Choledocholithiasis [K80.50] Calculus of gallbladder with biliary obstruction but without  cholecystitis [K80.21] Patient Active Problem List   Diagnosis Date Noted   Choledocholithiasis 09/17/2021   Postpartum care following vaginal delivery 07/27/2021   Encounter for care or examination of lactating mother 07/27/2021   Hypokalemia 01/15/2021   Elevated transaminase level 01/15/2021   Encounter for supervision of normal first pregnancy in third trimester 12/11/2020   PCP:  Pcp, No Pharmacy:   [id number] - [place], [place] - [address] [place] [address] Phone: [phone] Fax: [phone]     Social Determinants of Health (SDOH) Interventions    Readmission Risk Interventions     View : No data to display.

## 2021-09-18 NOTE — Consult Note (Signed)
Consultation  Referring Provider:   ED   Admit date: 09/17/2021 Consult date: 09/17/2021         Reason for Consultation:  Choledocholithiasis            HPI:   Robin LimerickMakayla Campbell is a 21 y.o. lady who is 7 weeks post-partum who developed abdominal pain after given birth that has progressively been getting worse to the point where yesterday it was constant. MRCP in the ED showed 5 mm stone in the distal CBD with initial liver enzymes showing just elevation in AST/ALT but this morning the liver enzymes show obstructive pattern with elevation in T. Bili to 2.8. No fevers or chills. No abdominal surgeries. No blood thinners. Mother had her gallbladder out due to gallstones.  Past Medical History:  Diagnosis Date   Medical history non-contributory     Past Surgical History:  Procedure Laterality Date   NO PAST SURGERIES      Family History  Problem Relation Age of Onset   Colon cancer Father        3960s   Throat cancer Father     Social History   Tobacco Use   Smoking status: Never    Passive exposure: Never   Smokeless tobacco: Never  Vaping Use   Vaping Use: Never used  Substance Use Topics   Alcohol use: Not Currently   Drug use: Never    Prior to Admission medications   Medication Sig Start Date End Date Taking? Authorizing Provider  norethindrone (ORTHO MICRONOR) 0.35 MG tablet Take 1 tablet (0.35 mg total) by mouth daily. Patient not taking: Reported on 09/15/2021 08/12/21   DominicCourtney Heys, Lydia Marie, CNM  Prenatal Vit-Fe Fumarate-FA (PRENATAL MULTIVITAMIN) TABS tablet Take 1 tablet by mouth daily at 12 noon. 07/29/21   Dominic, Courtney HeysLydia Marie, CNM    Current Facility-Administered Medications  Medication Dose Route Frequency Provider Last Rate Last Admin   0.9 %  sodium chloride infusion   Intravenous Continuous Mansy, Jan A, MD 100 mL/hr at 09/17/21 2022 New Bag at 09/17/21 2022   acetaminophen (TYLENOL) tablet 650 mg  650 mg Oral Q6H PRN Mansy, Jan A, MD   650 mg at 09/17/21  2234   Or   acetaminophen (TYLENOL) suppository 650 mg  650 mg Rectal Q6H PRN Mansy, Jan A, MD       ketorolac (TORADOL) 15 MG/ML injection 15 mg  15 mg Intravenous Q6H PRN Mansy, Jan A, MD   15 mg at 09/18/21 0416   magnesium hydroxide (MILK OF MAGNESIA) suspension 30 mL  30 mL Oral Daily PRN Mansy, Jan A, MD       ondansetron [facility](ZOFRAN) tablet 4 mg  4 mg Oral Q6H PRN Mansy, Jan A, MD       Or   ondansetron [facility](ZOFRAN) injection 4 mg  4 mg Intravenous Q6H PRN Mansy, Jan A, MD       piperacillin-tazobactam (ZOSYN) IVPB 3.375 g  3.375 g Intravenous Q8H Mansy, Jan A, MD 12.5 mL/hr at 09/18/21 0422 3.375 g at 09/18/21 0422   traZODone (DESYREL) tablet 25 mg  25 mg Oral QHS PRN Mansy, Jan A, MD        Allergies as of 09/17/2021   (No Known Allergies)     Review of Systems:    All systems reviewed and negative except where noted in HPI.  Review of Systems  Constitutional:  Negative for chills and fever.  Respiratory:  Negative for shortness of breath.   Cardiovascular:  Negative  for chest pain.  Gastrointestinal:  Positive for abdominal pain. Negative for blood in stool and melena.  Musculoskeletal:  Negative for joint pain.  Skin:  Negative for itching and rash.  Neurological:  Negative for focal weakness.  Psychiatric/Behavioral:  Negative for substance abuse.   All other systems reviewed and are negative.     Physical Exam:  Vital signs in last 24 hours: Temp:  [98 F (36.7 C)-98.3 F (36.8 C)] 98.3 F (36.8 C) (05/18 0223) Pulse Rate:  [53-98] 70 (05/18 0223) Resp:  [16-20] 20 (05/18 0223) BP: (97-127)/(59-99) 103/67 (05/18 0223) SpO2:  [94 %-100 %] 100 % (05/18 0223) Weight:  [65 kg] 65 kg (05/17 2050)   General:   Pleasant in NAD Head:  Normocephalic and atraumatic. Eyes:   No icterus.   Conjunctiva pink. Mouth: Mucosa pink moist, no lesions. Neck:  Supple; no masses felt Lungs:  No respiratory distress Abdomen:   Flat, soft, nondistended, nontender Rectal:  Not  performed.  Msk:  MAEW x4, No clubbing or cyanosis. Strength 5/5. Symmetrical without gross deformities. Neurologic:  Alert and  oriented x4;  Cranial nerves II-XII intact.  Skin:  Warm, dry, pink without significant lesions or rashes. Psych:  Alert and cooperative. Normal affect.  LAB RESULTS: Recent Labs    09/17/21 1341 09/18/21 0442  WBC 18.6* 7.9  HGB 12.6 12.0  HCT 40.1 37.6  PLT 310 264   BMET Recent Labs    09/17/21 1341 09/18/21 0442  NA 142 138  K 3.4* 3.4*  CL 110 106  CO2 24 26  GLUCOSE 108* 85  BUN 15 11  CREATININE 0.60 0.65  CALCIUM 8.6* 8.7*   LFT Recent Labs    09/18/21 0442  PROT 6.5  ALBUMIN 3.5  AST 597*  ALT 556*  ALKPHOS 158*  BILITOT 2.8*   PT/INR Recent Labs    09/17/21 2247  LABPROT 12.8  INR 1.0    STUDIES: MR 3D Recon At Scanner  Result Date: 09/17/2021 CLINICAL DATA:  Right upper quadrant pain and vomiting. Elevated liver function tests. Cholelithiasis. Biliary ductal dilatation on recent ultrasound. EXAM: MRI ABDOMEN WITHOUT AND WITH CONTRAST (INCLUDING MRCP) TECHNIQUE: Multiplanar multisequence MR imaging of the abdomen was performed both before and after the administration of intravenous contrast. Heavily T2-weighted images of the biliary and pancreatic ducts were obtained, and three-dimensional MRCP images were rendered by post processing. CONTRAST:  61mL GADAVIST GADOBUTROL 1 MMOL/ML IV SOLN COMPARISON:  Ultrasound on 09/17/2021 FINDINGS: Lower chest: No acute findings. Hepatobiliary: No hepatic masses identified. Multiple tiny gallstones are seen, however there is no evidence of cholecystitis. Mild biliary ductal dilatation is seen, with common bile duct measuring 7 mm. A single tiny less than 5 mm calculus is seen in the distal common bile duct (e.g. Image 23/13). Pancreas: No mass or inflammatory changes. No evidence of pancreatic ductal dilatation. Spleen:  Within normal limits in size and appearance. Adrenals/Urinary Tract: No  masses identified. No evidence of hydronephrosis. Stomach/Bowel: Unremarkable. Vascular/Lymphatic: No pathologically enlarged lymph nodes identified. No acute vascular findings. Other:  None. Musculoskeletal:  No suspicious bone lesions identified. IMPRESSION: Cholelithiasis. No radiographic evidence of cholecystitis. Mild biliary ductal dilatation, with tiny less than 5 mm calculus in distal common bile duct. Electronically Signed   By: Danae Orleans M.D.   On: 09/17/2021 18:53   MR ABDOMEN MRCP W WO CONTAST  Result Date: 09/17/2021 CLINICAL DATA:  Right upper quadrant pain and vomiting. Elevated liver function tests. Cholelithiasis. Biliary ductal dilatation  on recent ultrasound. EXAM: MRI ABDOMEN WITHOUT AND WITH CONTRAST (INCLUDING MRCP) TECHNIQUE: Multiplanar multisequence MR imaging of the abdomen was performed both before and after the administration of intravenous contrast. Heavily T2-weighted images of the biliary and pancreatic ducts were obtained, and three-dimensional MRCP images were rendered by post processing. CONTRAST:  68mL GADAVIST GADOBUTROL 1 MMOL/ML IV SOLN COMPARISON:  Ultrasound on 09/17/2021 FINDINGS: Lower chest: No acute findings. Hepatobiliary: No hepatic masses identified. Multiple tiny gallstones are seen, however there is no evidence of cholecystitis. Mild biliary ductal dilatation is seen, with common bile duct measuring 7 mm. A single tiny less than 5 mm calculus is seen in the distal common bile duct (e.g. Image 23/13). Pancreas: No mass or inflammatory changes. No evidence of pancreatic ductal dilatation. Spleen:  Within normal limits in size and appearance. Adrenals/Urinary Tract: No masses identified. No evidence of hydronephrosis. Stomach/Bowel: Unremarkable. Vascular/Lymphatic: No pathologically enlarged lymph nodes identified. No acute vascular findings. Other:  None. Musculoskeletal:  No suspicious bone lesions identified. IMPRESSION: Cholelithiasis. No radiographic evidence  of cholecystitis. Mild biliary ductal dilatation, with tiny less than 5 mm calculus in distal common bile duct. Electronically Signed   By: Danae Orleans M.D.   On: 09/17/2021 18:53   US Abdomen Limited RUQ (LIVER/GB)  Result Date: 09/17/2021 CLINICAL DATA:  RIGHT upper quadrant pain for 2 weeks with increasing severity. EXAM: ULTRASOUND ABDOMEN LIMITED RIGHT UPPER QUADRANT COMPARISON:  None Available. FINDINGS: Gallbladder: Numerous gallstones in the gallbladder. No reported tenderness over the gallbladder. No wall thickening or pericholecystic fluid. Largest gallstone in the gallbladder approximately 4 mm. Common bile duct: Diameter: 7.4 mm Intrahepatic biliary tree with mild intrahepatic biliary duct distension as well. Liver: Normal hepatic echotexture. Subtle echogenic focus in the RIGHT lobe of the liver measuring 12 mm may represent averaging of periportal fat or small hemangioma not well assessed. Portal vein is patent on color Doppler imaging with normal direction of blood flow towards the liver. Other: None. IMPRESSION: Cholelithiasis without acute cholecystitis. Biliary duct obstruction with mild intra and extrahepatic biliary duct dilation. MRI/MRCP may be helpful for further evaluation. Question small RIGHT hepatic lesion likely hemangioma or periportal fat. Electronically Signed   By: Donzetta Kohut M.D.   On: 09/17/2021 15:10       Impression / Plan:   21 y/o lady who is 7 weeks post-partum who has developed choledocholithiasis  - will discuss with advanced endoscopist about timing of ERCP - continue antibiotics - avoid any heparin products - recommend surgery consult for cholecystectomy while inpatient - further recs after procedure  Merlyn Lot MD, MPH [facility]

## 2021-09-19 ENCOUNTER — Inpatient Hospital Stay: Payer: Medicaid Other | Admitting: Anesthesiology

## 2021-09-19 ENCOUNTER — Encounter: Payer: Self-pay | Admitting: Gastroenterology

## 2021-09-19 ENCOUNTER — Encounter
Admission: EM | Disposition: A | Discharge status: Home / Self Care | Payer: Self-pay | Source: Home / Self Care | Attending: Internal Medicine

## 2021-09-19 DIAGNOSIS — K819 Cholecystitis, unspecified: Secondary | ICD-10-CM | POA: Diagnosis not present

## 2021-09-19 DIAGNOSIS — K8050 Calculus of bile duct without cholangitis or cholecystitis without obstruction: Secondary | ICD-10-CM | POA: Diagnosis not present

## 2021-09-19 DIAGNOSIS — K8080 Other cholelithiasis without obstruction: Secondary | ICD-10-CM | POA: Diagnosis not present

## 2021-09-19 DIAGNOSIS — K811 Chronic cholecystitis: Secondary | ICD-10-CM | POA: Diagnosis not present

## 2021-09-19 LAB — COMPREHENSIVE METABOLIC PANEL
ALT: 319 U/L — ABNORMAL HIGH (ref 0–44)
AST: 139 U/L — ABNORMAL HIGH (ref 15–41)
Albumin: 3.3 g/dL — ABNORMAL LOW (ref 3.5–5.0)
Alkaline Phosphatase: 163 U/L — ABNORMAL HIGH (ref 38–126)
Anion gap: 10 (ref 5–15)
BUN: 9 mg/dL (ref 6–20)
CO2: 23 mmol/L (ref 22–32)
Calcium: 8.8 mg/dL — ABNORMAL LOW (ref 8.9–10.3)
Chloride: 105 mmol/L (ref 98–111)
Creatinine, Ser: 0.74 mg/dL (ref 0.44–1.00)
GFR, Estimated: >60 mL/min (ref >60)
Glucose, Bld: 102 mg/dL — ABNORMAL HIGH (ref 70–99)
Potassium: 4.3 mmol/L (ref 3.5–5.1)
Sodium: 138 mmol/L (ref 135–145)
Total Bilirubin: 1 mg/dL (ref 0.3–1.2)
Total Protein: 6.1 g/dL — ABNORMAL LOW (ref 6.5–8.1)

## 2021-09-19 LAB — CBC
HCT: 35 % — ABNORMAL LOW (ref 36.0–46.0)
Hemoglobin: 11.2 g/dL — ABNORMAL LOW (ref 12.0–15.0)
MCH: 28.4 pg (ref 26.0–34.0)
MCHC: 32 g/dL (ref 30.0–36.0)
MCV: 88.8 fL (ref 80.0–100.0)
Platelets: 249 10*3/uL (ref 150–400)
RBC: 3.94 MIL/uL (ref 3.87–5.11)
RDW: 13.6 % (ref 11.5–15.5)
WBC: 9.5 10*3/uL (ref 4.0–10.5)
nRBC: 0 % (ref 0.0–0.2)

## 2021-09-19 LAB — MAGNESIUM: Magnesium: 1.8 mg/dL (ref 1.7–2.4)

## 2021-09-19 SURGERY — CHOLECYSTECTOMY, ROBOT-ASSISTED, LAPAROSCOPIC
Anesthesia: General

## 2021-09-19 MED ORDER — OXYCODONE HCL 5 MG PO TABS
ORAL_TABLET | ORAL | Status: AC
  Filled 2021-09-19: qty 1

## 2021-09-19 MED ORDER — LACTATED RINGERS IV SOLN: INTRAVENOUS | Status: DC

## 2021-09-19 MED ORDER — HYDROCODONE-ACETAMINOPHEN 5-325 MG PO TABS
1.0000 | ORAL_TABLET | Freq: Four times a day (QID) | ORAL | Status: DC | PRN
  Administered 2021-09-20: 1 via ORAL
  Filled 2021-09-19: qty 1

## 2021-09-19 MED ORDER — BUPIVACAINE-EPINEPHRINE (PF) 0.25% -1:200000 IJ SOLN
INTRAMUSCULAR | Status: AC
Start: 2021-09-19 — End: ?
  Filled 2021-09-19: qty 30

## 2021-09-19 MED ORDER — ONDANSETRON HCL 4 MG/2ML IJ SOLN
INTRAMUSCULAR | Status: AC
  Filled 2021-09-19: qty 2

## 2021-09-19 MED ORDER — PIPERACILLIN-TAZOBACTAM 3.375 G IVPB
INTRAVENOUS | Status: AC
  Administered 2021-09-19: 3.375 g via INTRAVENOUS
  Filled 2021-09-19: qty 50

## 2021-09-19 MED ORDER — FENTANYL CITRATE (PF) 100 MCG/2ML IJ SOLN
INTRAMUSCULAR | Status: AC
  Filled 2021-09-19: qty 2

## 2021-09-19 MED ORDER — BUPIVACAINE-EPINEPHRINE 0.25% -1:200000 IJ SOLN
INTRAMUSCULAR | Status: DC | PRN
  Administered 2021-09-19: 30 mL

## 2021-09-19 MED ORDER — ACETAMINOPHEN 10 MG/ML IV SOLN
INTRAVENOUS | Status: DC | PRN
  Administered 2021-09-19: 1000 mg via INTRAVENOUS

## 2021-09-19 MED ORDER — MORPHINE SULFATE (PF) 4 MG/ML IV SOLN: 4.0000 mg | INTRAVENOUS | Status: DC | PRN

## 2021-09-19 MED ORDER — DEXAMETHASONE SODIUM PHOSPHATE 10 MG/ML IJ SOLN
INTRAMUSCULAR | Status: AC
  Filled 2021-09-19: qty 1

## 2021-09-19 MED ORDER — LIDOCAINE HCL (CARDIAC) PF 100 MG/5ML IV SOSY
PREFILLED_SYRINGE | INTRAVENOUS | Status: DC | PRN
  Administered 2021-09-19: 60 mg via INTRAVENOUS

## 2021-09-19 MED ORDER — SUGAMMADEX SODIUM 200 MG/2ML IV SOLN
INTRAVENOUS | Status: DC | PRN
  Administered 2021-09-19: 200 mg via INTRAVENOUS

## 2021-09-19 MED ORDER — LIDOCAINE HCL (PF) 2 % IJ SOLN
INTRAMUSCULAR | Status: AC
  Filled 2021-09-19: qty 5

## 2021-09-19 MED ORDER — OXYCODONE HCL 5 MG/5ML PO SOLN: 5.0000 mg | Freq: Once | ORAL | Status: AC | PRN

## 2021-09-19 MED ORDER — PROPOFOL 10 MG/ML IV BOLUS
INTRAVENOUS | Status: AC
  Filled 2021-09-19: qty 40

## 2021-09-19 MED ORDER — ROCURONIUM BROMIDE 100 MG/10ML IV SOLN
INTRAVENOUS | Status: DC | PRN
  Administered 2021-09-19: 50 mg via INTRAVENOUS

## 2021-09-19 MED ORDER — PROPOFOL 500 MG/50ML IV EMUL
INTRAVENOUS | Status: AC
  Filled 2021-09-19: qty 50

## 2021-09-19 MED ORDER — FENTANYL CITRATE (PF) 100 MCG/2ML IJ SOLN
INTRAMUSCULAR | Status: AC
  Administered 2021-09-19: 25 ug via INTRAVENOUS
  Filled 2021-09-19: qty 2

## 2021-09-19 MED ORDER — KETOROLAC TROMETHAMINE 30 MG/ML IJ SOLN
INTRAMUSCULAR | Status: DC | PRN
  Administered 2021-09-19: 30 mg via INTRAVENOUS

## 2021-09-19 MED ORDER — ONDANSETRON HCL 4 MG/2ML IJ SOLN
INTRAMUSCULAR | Status: DC | PRN
  Administered 2021-09-19: 4 mg via INTRAVENOUS

## 2021-09-19 MED ORDER — PHENYLEPHRINE 80 MCG/ML (10ML) SYRINGE FOR IV PUSH (FOR BLOOD PRESSURE SUPPORT)
PREFILLED_SYRINGE | INTRAVENOUS | Status: AC
  Filled 2021-09-19: qty 10

## 2021-09-19 MED ORDER — PROPOFOL 10 MG/ML IV BOLUS
INTRAVENOUS | Status: DC | PRN
  Administered 2021-09-19: 130 mg via INTRAVENOUS

## 2021-09-19 MED ORDER — PROPOFOL 500 MG/50ML IV EMUL
INTRAVENOUS | Status: DC | PRN
  Administered 2021-09-19: 30 ug/kg/min via INTRAVENOUS

## 2021-09-19 MED ORDER — ACETAMINOPHEN 10 MG/ML IV SOLN
INTRAVENOUS | Status: AC
  Filled 2021-09-19: qty 100

## 2021-09-19 MED ORDER — LACTATED RINGERS IV SOLN: INTRAVENOUS | Status: DC | PRN

## 2021-09-19 MED ORDER — ACETAMINOPHEN 10 MG/ML IV SOLN: 1000.0000 mg | Freq: Once | INTRAVENOUS | Status: DC | PRN

## 2021-09-19 MED ORDER — FENTANYL CITRATE (PF) 100 MCG/2ML IJ SOLN
INTRAMUSCULAR | Status: DC | PRN
  Administered 2021-09-19 (×2): 50 ug via INTRAVENOUS

## 2021-09-19 MED ORDER — FENTANYL CITRATE (PF) 100 MCG/2ML IJ SOLN
25.0000 ug | INTRAMUSCULAR | Status: DC | PRN
  Administered 2021-09-19: 25 ug via INTRAVENOUS

## 2021-09-19 MED ORDER — ONDANSETRON HCL 4 MG/2ML IJ SOLN
4.0000 mg | Freq: Once | INTRAMUSCULAR | Status: AC | PRN
  Administered 2021-09-19: 4 mg via INTRAVENOUS

## 2021-09-19 MED ORDER — OXYCODONE HCL 5 MG PO TABS
5.0000 mg | ORAL_TABLET | Freq: Once | ORAL | Status: AC | PRN
  Administered 2021-09-19: 5 mg via ORAL

## 2021-09-19 MED ORDER — ROCURONIUM BROMIDE 10 MG/ML (PF) SYRINGE
PREFILLED_SYRINGE | INTRAVENOUS | Status: AC
  Filled 2021-09-19: qty 10

## 2021-09-19 MED ORDER — INDOCYANINE GREEN 25 MG IV SOLR
1.2500 mg | Freq: Once | INTRAVENOUS | Status: AC
Start: 2021-09-19 — End: 2021-09-19
  Administered 2021-09-19: 1.25 mg via INTRAVENOUS
  Filled 2021-09-19: qty 0.5

## 2021-09-19 MED ORDER — DEXAMETHASONE SODIUM PHOSPHATE 10 MG/ML IJ SOLN
INTRAMUSCULAR | Status: DC | PRN
  Administered 2021-09-19: 10 mg via INTRAVENOUS

## 2021-09-19 SURGICAL SUPPLY — 56 items
ADH SKN CLS APL DERMABOND .7 (GAUZE/BANDAGES/DRESSINGS) ×1
BAG INFUSER PRESSURE 100CC (MISCELLANEOUS) IMPLANT
BAG RETRIEVAL 10 (BASKET) ×1
BLADE SURG SZ11 CARB STEEL (BLADE) ×2 IMPLANT
CANNULA REDUC XI 12-8 STAPL (CANNULA) ×2
CANNULA REDUCER 12-8 DVNC XI (CANNULA) ×1 IMPLANT
CATH REDDICK CHOLANGI 4FR 50CM (CATHETERS) IMPLANT
CLIP LIGATING HEM O LOK PURPLE (MISCELLANEOUS) IMPLANT
CLIP LIGATING HEMO O LOK GREEN (MISCELLANEOUS) ×2 IMPLANT
DERMABOND ADVANCED (GAUZE/BANDAGES/DRESSINGS) ×1
DERMABOND ADVANCED .7 DNX12 (GAUZE/BANDAGES/DRESSINGS) ×1 IMPLANT
DRAPE ARM DVNC X/XI (DISPOSABLE) ×4 IMPLANT
DRAPE C-ARM XRAY 36X54 (DRAPES) IMPLANT
DRAPE COLUMN DVNC XI (DISPOSABLE) ×1 IMPLANT
DRAPE DA VINCI XI ARM (DISPOSABLE) ×8
DRAPE DA VINCI XI COLUMN (DISPOSABLE) ×2
ELECT REM PT RETURN 9FT ADLT (ELECTROSURGICAL) ×2
ELECTRODE REM PT RTRN 9FT ADLT (ELECTROSURGICAL) ×1 IMPLANT
GLOVE BIO SURGEON STRL SZ 6.5 (GLOVE) ×5 IMPLANT
GLOVE BIOGEL PI IND STRL 6.5 (GLOVE) ×2 IMPLANT
GLOVE BIOGEL PI INDICATOR 6.5 (GLOVE) ×3
GOWN STRL REUS W/ TWL LRG LVL3 (GOWN DISPOSABLE) ×3 IMPLANT
GOWN STRL REUS W/TWL LRG LVL3 (GOWN DISPOSABLE) ×6
GRASPER SUT TROCAR 14GX15 (MISCELLANEOUS) ×2 IMPLANT
IRRIGATOR SUCT 8 DISP DVNC XI (IRRIGATION / IRRIGATOR) IMPLANT
IRRIGATOR SUCTION 8MM XI DISP (IRRIGATION / IRRIGATOR)
IV CATH ANGIO 12GX3 LT BLUE (NEEDLE) IMPLANT
IV NS 1000ML (IV SOLUTION)
IV NS 1000ML BAXH (IV SOLUTION) IMPLANT
KIT PINK PAD W/HEAD ARE REST (MISCELLANEOUS) ×2
KIT PINK PAD W/HEAD ARM REST (MISCELLANEOUS) ×1 IMPLANT
LABEL OR SOLS (LABEL) ×2 IMPLANT
MANIFOLD NEPTUNE II (INSTRUMENTS) ×2 IMPLANT
NDL INSUFFLATION 14GA 120MM (NEEDLE) ×1 IMPLANT
NEEDLE HYPO 22GX1.5 SAFETY (NEEDLE) ×2 IMPLANT
NEEDLE INSUFFLATION 14GA 120MM (NEEDLE) ×2 IMPLANT
NS IRRIG 500ML POUR BTL (IV SOLUTION) ×2 IMPLANT
OBTURATOR OPTICAL STANDARD 8MM (TROCAR) ×2
OBTURATOR OPTICAL STND 8 DVNC (TROCAR) ×1
OBTURATOR OPTICALSTD 8 DVNC (TROCAR) ×1 IMPLANT
PACK LAP CHOLECYSTECTOMY (MISCELLANEOUS) ×2 IMPLANT
SEAL CANN UNIV 5-8 DVNC XI (MISCELLANEOUS) ×3 IMPLANT
SEAL XI 5MM-8MM UNIVERSAL (MISCELLANEOUS) ×6
SET TUBE SMOKE EVAC HIGH FLOW (TUBING) ×2 IMPLANT
SOLUTION ELECTROLUBE (MISCELLANEOUS) ×2 IMPLANT
SPIKE FLUID TRANSFER (MISCELLANEOUS) ×4 IMPLANT
SPONGE T-LAP 4X18 ~~LOC~~+RFID (SPONGE) IMPLANT
STAPLER CANNULA SEAL DVNC XI (STAPLE) ×1 IMPLANT
STAPLER CANNULA SEAL XI (STAPLE) ×2
SUT MNCRL 4-0 (SUTURE) ×2
SUT MNCRL 4-0 27XMFL (SUTURE) ×1
SUT VICRYL 0 AB UR-6 (SUTURE) ×2 IMPLANT
SUTURE MNCRL 4-0 27XMF (SUTURE) ×1 IMPLANT
SYS BAG RETRIEVAL 10MM (BASKET) ×1
SYSTEM BAG RETRIEVAL 10MM (BASKET) ×1 IMPLANT
WATER STERILE IRR 500ML POUR (IV SOLUTION) ×2 IMPLANT

## 2021-09-19 NOTE — Anesthesia Postprocedure Evaluation (Signed)
Anesthesia Post Note  Patient: Robin Campbell  Procedure(s) Performed: ENDOSCOPIC RETROGRADE CHOLANGIOPANCREATOGRAPHY (ERCP)  Patient location during evaluation: PACU Anesthesia Type: General Level of consciousness: awake and alert Pain management: pain level controlled Vital Signs Assessment: post-procedure vital signs reviewed and stable Respiratory status: spontaneous breathing, nonlabored ventilation, respiratory function stable and patient connected to nasal cannula oxygen Cardiovascular status: blood pressure returned to baseline and stable Postop Assessment: no apparent nausea or vomiting Anesthetic complications: no   No notable events documented.   Last Vitals:  Vitals:   09/19/21 0816 09/19/21 1122  BP: 106/62 111/70  Pulse: 60 64  Resp: 17 18  Temp: 36.7 C 36.6 C  SpO2: 100% 100%    Last Pain:  Vitals:   09/19/21 1122  TempSrc: Temporal  PainSc: 0-No pain                 Lenard Simmer

## 2021-09-19 NOTE — Anesthesia Preprocedure Evaluation (Addendum)
Anesthesia Evaluation  Patient identified by MRN, date of birth, ID band Patient awake    Reviewed: Allergy & Precautions, NPO status , Patient's Chart, lab work & pertinent test results  History of Anesthesia Complications Negative for: history of anesthetic complications  Airway Mallampati: III   Neck ROM: Full    Dental no notable dental hx.    Pulmonary neg pulmonary ROS,    Pulmonary exam normal breath sounds clear to auscultation       Cardiovascular Exercise Tolerance: Good negative cardio ROS Normal cardiovascular exam Rhythm:Regular Rate:Normal     Neuro/Psych negative neurological ROS     GI/Hepatic GERD (during pregnancy)  ,NAFLD Choledocholithiasis s/p ERCP   Endo/Other  negative endocrine ROS  Renal/GU negative Renal ROS     Musculoskeletal   Abdominal   Peds  Hematology negative hematology ROS (+)   Anesthesia Other Findings   Reproductive/Obstetrics (+) Breast feeding                             Anesthesia Physical Anesthesia Plan  ASA: 2  Anesthesia Plan: General   Post-op Pain Management:    Induction: Intravenous  PONV Risk Score and Plan: 3 and Ondansetron, Dexamethasone and Treatment may vary due to age or medical condition  Airway Management Planned: Oral ETT  Additional Equipment:   Intra-op Plan:   Post-operative Plan: Extubation in OR  Informed Consent: I have reviewed the patients History and Physical, chart, labs and discussed the procedure including the risks, benefits and alternatives for the proposed anesthesia with the patient or authorized representative who has indicated his/her understanding and acceptance.     Dental advisory given  Plan Discussed with: CRNA  Anesthesia Plan Comments: (Patient consented for risks of anesthesia including but not limited to:  - adverse reactions to medications - damage to eyes, teeth, lips or other  oral mucosa - nerve damage due to positioning  - sore throat or hoarseness - damage to heart, brain, nerves, lungs, other parts of body or loss of life  Informed patient about role of CRNA in peri- and intra-operative care.  Patient voiced understanding.)        Anesthesia Quick Evaluation

## 2021-09-19 NOTE — Anesthesia Procedure Notes (Addendum)
Procedure Name: Intubation Date/Time: 09/19/2021 12:59 PM Performed by: Johney Maine, CRNA Pre-anesthesia Checklist: Patient identified, Patient being monitored, Timeout performed, Emergency Drugs available and Suction available Patient Re-evaluated:Patient Re-evaluated prior to induction Oxygen Delivery Method: Circle system utilized Preoxygenation: Pre-oxygenation with 100% oxygen Induction Type: IV induction Ventilation: Mask ventilation without difficulty Laryngoscope Size: Miller and 2 Grade View: Grade I Tube type: Oral Tube size: 7.0 mm Number of attempts: 1 Airway Equipment and Method: Stylet Placement Confirmation: ETT inserted through vocal cords under direct vision, positive ETCO2 and breath sounds checked- equal and bilateral Secured at: 21 cm Tube secured with: Tape Dental Injury: Teeth and Oropharynx as per pre-operative assessment

## 2021-09-19 NOTE — Assessment & Plan Note (Signed)
Resolved -Replace potassium as needed and monitor 

## 2021-09-19 NOTE — Assessment & Plan Note (Signed)
S/p ERCP with sphincterotomy and removal of bile stones. Going for laparoscopic cholecystectomy with general surgery today. -Monitor for any perforation or signs of cholangitis after the procedure. -Continue with pain management -Continue supportive care -Continue with Zosyn

## 2021-09-19 NOTE — Anesthesia Postprocedure Evaluation (Signed)
Anesthesia Post Note  Patient: Robin Campbell  Procedure(s) Performed: XI ROBOTIC ASSISTED LAPAROSCOPIC CHOLECYSTECTOMY INDOCYANINE GREEN FLUORESCENCE IMAGING (ICG)  Patient location during evaluation: PACU Anesthesia Type: General Level of consciousness: awake and alert, oriented and patient cooperative Pain management: pain level controlled Vital Signs Assessment: post-procedure vital signs reviewed and stable Respiratory status: spontaneous breathing, nonlabored ventilation and respiratory function stable Cardiovascular status: blood pressure returned to baseline and stable Postop Assessment: adequate PO intake Anesthetic complications: no   No notable events documented.   Last Vitals:  Vitals:   09/19/21 1501 09/19/21 1525  BP: (!) 100/57 103/67  Pulse: 60 60  Resp: 15 18  Temp: 36.6 C 36.7 C  SpO2: 100% 100%    Last Pain:  Vitals:   09/19/21 1525  TempSrc: Oral  PainSc:                  Darrin Nipper

## 2021-09-19 NOTE — Progress Notes (Signed)
Progress Note   Patient: Robin Campbell [id number] DOB: January 28, 2001 DOA: 09/17/2021     2 DOS: the patient was seen and examined on 09/19/2021   Brief hospital course: Taken from H&P.  Raul Winterhalter is a 21 y.o. female with no chronic medical problems, who presented to the emergency room with acute onset of right flank pain radiating to her right upper quadrant which has been intermittent over the last 7 weeks since she delivered her first baby, and got significantly worse over the last couple of weeks occurring every night.  She described it as a colicky pain.  No fever or chills.  She had nausea and vomiting today.  No dysuria oliguria, hematuria or urinary frequency or urgency or flank pain.   ED Course: Upon presentation to the emergency room, vital signs revealed a blood pressure of 97/59 and later 110/64 and heart rate was 98 with otherwise normal vital signs.  Labs revealed mild hypokalemia and AST 132 with ALT of 69.  CBC showed leukocytosis of 18.6.   Imaging: Right upper quadrant ultrasound revealed the following: Cholelithiasis without acute cholecystitis. Question small RIGHT hepatic lesion likely hemangioma or periportal fat.  MRCP revealed cholelithiasis without evidence of cholecystitis.  It showed mild biliary ductal dilatation with tiny less than 5 mm calculus in the distal common bile duct.  The patient was given 1 mg of IV Dilaudid, 1 L bolus of IV lactated Ringer and 3.375 g of IV Zosyn.  She will be admitted to a medical bed for further evaluation and management.  5/18: Patient is feeling much improved and waiting for ERCP when seen during morning rounds. Worsening of liver enzymes, now elevated alkaline phosphatase and T. bili, picture more consistent with obstruction. Patient underwent successful ERCP with GI later today, s/p sphincterotomy and removal of bile stones. General surgery was also consulted for evaluation, they will decide about cholecystectomy either  during current hospitalization or later as elective. Patient is currently breast-feeding.  5/19: Patient with decreasing AST and ALT after the ERCP, T. bili has been normalized, slight worsening of alkaline phosphatase.  Going for laparoscopic cholecystectomy today. No definitive source of infection at this time so sepsis ruled out.   Assessment and Plan: * Choledocholithiasis S/p ERCP with sphincterotomy and removal of bile stones. Going for laparoscopic cholecystectomy with general surgery today. -Monitor for any perforation or signs of cholangitis after the procedure. -Continue with pain management -Continue supportive care -Continue with Zosyn  Elevated transaminase level Liver enzymes started improving after the ERCP as anticipated.  Slight worsening of alkaline phosphatase. -Monitor liver function -Continue with supportive care  Hypokalemia Resolved. -Replace potassium as needed and monitor   Subjective: Patient is feeling improved when seen during morning rounds.  She was getting anxious with her upcoming surgery today.  Denies any pain.  Physical Exam: Vitals:   09/18/21 2004 09/19/21 0500 09/19/21 0816 09/19/21 1122  BP: 102/68 (!) 97/52 106/62 111/70  Pulse:  64 60 64  Resp: 16 16 17 18   Temp: 98.1 F (36.7 C) 97.9 F (36.6 C) 98 F (36.7 C) 97.8 F (36.6 C)  TempSrc: Oral Oral Oral Temporal  SpO2: 100% 100% 100% 100%  Weight:    65 kg  Height:    5\' 3"  (1.6 m)   General.  Well-developed young lady, in no acute distress. Pulmonary.  Lungs clear bilaterally, normal respiratory effort. CV.  Regular rate and rhythm, no JVD, rub or murmur. Abdomen.  Soft, nontender, nondistended, BS positive. CNS.  Alert and oriented .  No focal neurologic deficit. Extremities.  No edema, no cyanosis, pulses intact and symmetrical. Psychiatry.  Judgment and insight appears normal.  Data Reviewed: Notes, labs and images reviewed  Family Communication:    Disposition: Status is: Inpatient Remains inpatient appropriate because: Severity of illness   Planned Discharge Destination: Home  Time spent: 40 minutes  This record has been created using Conservation officer, historic buildings. Errors have been sought and corrected,but may not always be located. Such creation errors do not reflect on the standard of care.  Author: Arnetha Courser, MD 09/19/2021 1:57 PM  For on call review [email or web address].

## 2021-09-19 NOTE — Op Note (Signed)
Preoperative diagnosis: History of choledocholithiasis  Postoperative diagnosis: Same  Procedure: Robotic Assisted Laparoscopic Cholecystectomy.   Anesthesia: GETA   Surgeon: Dr. Hazle Quant  Wound Classification: Clean Contaminated  Indications: Patient is a 21 y.o. female developed right upper quadrant pain and on workup was found to have cholelithiasis with a dilated common duct with choledocholithiasis. Successful ERCP done yesterday.  Robotic Assisted Laparoscopic cholecystectomy was elected.  Findings:  Critical view of safety achieved Cystic duct and artery identified, ligated and divided Adequate hemostasis     Description of procedure: The patient was placed on the operating table in the supine position. General anesthesia was induced. A time-out was completed verifying correct patient, procedure, site, positioning, and implant(s) and/or special equipment prior to beginning this procedure. An orogastric tube was placed. The abdomen was prepped and draped in the usual sterile fashion.  An incision was made in a natural skin line below the umbilicus.  The fascia was elevated and the Veress needle inserted. Proper position was confirmed by aspiration and saline meniscus test.  The abdomen was insufflated with carbon dioxide to a pressure of 15 mmHg. The patient tolerated insufflation well. A 8-mm trocar was then inserted in optiview fashion.  The laparoscope was inserted and the abdomen inspected. No injuries from initial trocar placement were noted. Additional trocars were then inserted in the following locations: an 8-mm trocar in the left lateral abdomen, and another two 8-mm trocars to the right side of the abdomen 5 cm appart. The umbilical trocar was changed to a 12 mm trocar all under direct visualization. The abdomen was inspected and no abnormalities were found. The table was placed in the reverse Trendelenburg position with the right side up. The robotic arms were docked  and target anatomy identified. Instrument inserted under direct visualization.  Filmy adhesions between the gallbladder and omentum, duodenum and transverse colon were lysed with electrocautery. The dome of the gallbladder was grasped with a prograsp and retracted over the dome of the liver. The infundibulum was also grasped with an atraumatic grasper and retracted toward the right lower quadrant. This maneuver exposed Calot's triangle. The peritoneum overlying the gallbladder infundibulum was then incised and the cystic duct and cystic artery identified and circumferentially dissected. Critical view of safety reviewed before ligating any structure. Firefly images taken to visualize biliary ducts. The cystic duct and cystic artery were then doubly clipped and divided close to the gallbladder.  The gallbladder was then dissected from its peritoneal attachments by electrocautery. Hemostasis was checked and the gallbladder and contained stones were removed using an endoscopic retrieval bag. The gallbladder was passed off the table as a specimen. There was no evidence of bleeding from the gallbladder fossa or cystic artery or leakage of the bile from the cystic duct stump. Secondary trocars were removed under direct vision. No bleeding was noted. The robotic arms were undoked. The scope was withdrawn and the umbilical trocar removed. The abdomen was allowed to collapse. The fascia of the 33mm trocar sites was closed with figure-of-eight 0 vicryl sutures. The skin was closed with subcuticular sutures of 4-0 monocryl and topical skin adhesive. The orogastric tube was removed.  The patient tolerated the procedure well and was taken to the postanesthesia care unit in stable condition.   Specimen: Gallbladder  Complications: None  EBL: 3 mL

## 2021-09-19 NOTE — Interval H&P Note (Signed)
History and Physical Interval Note:  09/19/2021 12:13 PM  Robin Campbell  has presented today for surgery, with the diagnosis of Cholecystitis.  The various methods of treatment have been discussed with the patient and family. After consideration of risks, benefits and other options for treatment, the patient has consented to  Procedure(s): XI ROBOTIC ASSISTED LAPAROSCOPIC CHOLECYSTECTOMY (N/A) INDOCYANINE GREEN FLUORESCENCE IMAGING (ICG) (N/A) as a surgical intervention.  The patient's history has been reviewed, patient examined, no change in status, stable for surgery.  I have reviewed the patient's chart and labs.  Questions were answered to the patient's satisfaction.     Carolan Shiver

## 2021-09-19 NOTE — Transfer of Care (Signed)
Immediate Anesthesia Transfer of Care Note  Patient: Robin Campbell  Procedure(s) Performed: XI ROBOTIC ASSISTED LAPAROSCOPIC CHOLECYSTECTOMY INDOCYANINE GREEN FLUORESCENCE IMAGING (ICG)  Patient Location: PACU  Anesthesia Type:General  Level of Consciousness: drowsy and patient cooperative  Airway & Oxygen Therapy: Patient Spontanous Breathing and Patient connected to nasal cannula oxygen  Post-op Assessment: Report given to RN and Post -op Vital signs reviewed and stable  Post vital signs: Reviewed and stable  Last Vitals:  Vitals Value Taken Time  BP    Temp    Pulse 78 09/19/21 1406  Resp 20 09/19/21 1406  SpO2 98 % 09/19/21 1406  Vitals shown include unvalidated device data.  Last Pain:  Vitals:   09/19/21 1122  TempSrc: Temporal  PainSc: 0-No pain         Complications: No notable events documented.

## 2021-09-19 NOTE — Assessment & Plan Note (Signed)
Liver enzymes started improving after the ERCP as anticipated.  Slight worsening of alkaline phosphatase. -Monitor liver function -Continue with supportive care

## 2021-09-20 DIAGNOSIS — K8050 Calculus of bile duct without cholangitis or cholecystitis without obstruction: Secondary | ICD-10-CM | POA: Diagnosis not present

## 2021-09-20 LAB — COMPREHENSIVE METABOLIC PANEL
ALT: 205 U/L — ABNORMAL HIGH (ref 0–44)
AST: 47 U/L — ABNORMAL HIGH (ref 15–41)
Albumin: 3.3 g/dL — ABNORMAL LOW (ref 3.5–5.0)
Alkaline Phosphatase: 130 U/L — ABNORMAL HIGH (ref 38–126)
Anion gap: 8 (ref 5–15)
BUN: 10 mg/dL (ref 6–20)
CO2: 26 mmol/L (ref 22–32)
Calcium: 8.5 mg/dL — ABNORMAL LOW (ref 8.9–10.3)
Chloride: 104 mmol/L (ref 98–111)
Creatinine, Ser: 0.79 mg/dL (ref 0.44–1.00)
GFR, Estimated: >60 mL/min (ref >60)
Glucose, Bld: 84 mg/dL (ref 70–99)
Potassium: 3.4 mmol/L — ABNORMAL LOW (ref 3.5–5.1)
Sodium: 138 mmol/L (ref 135–145)
Total Bilirubin: 0.6 mg/dL (ref 0.3–1.2)
Total Protein: 6.1 g/dL — ABNORMAL LOW (ref 6.5–8.1)

## 2021-09-20 LAB — CBC
HCT: 32.8 % — ABNORMAL LOW (ref 36.0–46.0)
Hemoglobin: 10.3 g/dL — ABNORMAL LOW (ref 12.0–15.0)
MCH: 28.1 pg (ref 26.0–34.0)
MCHC: 31.4 g/dL (ref 30.0–36.0)
MCV: 89.4 fL (ref 80.0–100.0)
Platelets: 243 10*3/uL (ref 150–400)
RBC: 3.67 MIL/uL — ABNORMAL LOW (ref 3.87–5.11)
RDW: 14.5 % (ref 11.5–15.5)
WBC: 12.2 10*3/uL — ABNORMAL HIGH (ref 4.0–10.5)
nRBC: 0 % (ref 0.0–0.2)

## 2021-09-20 MED ORDER — POTASSIUM CHLORIDE CRYS ER 20 MEQ PO TBCR
40.0000 meq | EXTENDED_RELEASE_TABLET | Freq: Once | ORAL | Status: AC
  Administered 2021-09-20: 40 meq via ORAL
  Filled 2021-09-20: qty 2

## 2021-09-20 MED ORDER — OXYCODONE-ACETAMINOPHEN 5-325 MG PO TABS
1.0000 | ORAL_TABLET | ORAL | 0 refills | Status: DC | PRN
Start: 2021-09-20 — End: 2022-06-19

## 2021-09-20 MED ORDER — ONDANSETRON HCL 4 MG PO TABS
4.0000 mg | ORAL_TABLET | Freq: Four times a day (QID) | ORAL | 0 refills | Status: DC | PRN
Start: 2021-09-20 — End: 2022-06-19

## 2021-09-20 MED ORDER — MAGNESIUM SULFATE 2 GM/50ML IV SOLN
2.0000 g | Freq: Once | INTRAVENOUS | Status: AC
  Administered 2021-09-20: 2 g via INTRAVENOUS
  Filled 2021-09-20: qty 50

## 2021-09-20 NOTE — Discharge Instructions (Signed)
  Diet: Resume home heart healthy regular diet.   Activity: No heavy lifting >20 pounds (children, pets, laundry, garbage) or strenuous activity until follow-up, but light activity and walking are encouraged. Do not drive or drink alcohol if taking narcotic pain medications.  Wound care: May shower with soapy water and pat dry (do not rub incisions), but no baths or submerging incision underwater until follow-up. (no swimming)   Medications: Resume all home medications. For mild to moderate pain: acetaminophen (Tylenol) or ibuprofen (if no kidney disease). Combining Tylenol with alcohol can substantially increase your risk of causing liver disease. Narcotic pain medications, if prescribed, can be used for severe pain, though may cause nausea, constipation, and drowsiness. If you do not need the narcotic pain medication, you do not need to fill the prescription.  If you are taking the oxycodone, may need to discard the breast milk until you are not using the narcotics.   Call office [phone]) at any time if any questions, worsening pain, fevers/chills, bleeding, drainage from incision site, or other concerns.

## 2021-09-20 NOTE — Discharge Summary (Signed)
Physician Discharge Summary   Patient: Robin Campbell MRN: [id number] DOB: Mar 03, 2001  Admit date:     09/17/2021  Discharge date: 09/20/21  Discharge Physician: Arnetha Courser   PCP: Pcp, No   Recommendations at discharge:  Please obtain CBC and CMP in 1 week Follow-up with general surgery  Discharge Diagnoses: Principal Problem:   Choledocholithiasis Active Problems:   Elevated transaminase level   Hypokalemia  Resolved Problems:   * No resolved hospital problems. [facility] Course: Taken from H&P.  Robin Campbell is a 21 y.o. female with no chronic medical problems, who presented to the emergency room with acute onset of right flank pain radiating to her right upper quadrant which has been intermittent over the last 7 weeks since she delivered her first baby, and got significantly worse over the last couple of weeks occurring every night.  She described it as a colicky pain.  No fever or chills.  She had nausea and vomiting today.  No dysuria oliguria, hematuria or urinary frequency or urgency or flank pain.   ED Course: Upon presentation to the emergency room, vital signs revealed a blood pressure of 97/59 and later 110/64 and heart rate was 98 with otherwise normal vital signs.  Labs revealed mild hypokalemia and AST 132 with ALT of 69.  CBC showed leukocytosis of 18.6.   Imaging: Right upper quadrant ultrasound revealed the following: Cholelithiasis without acute cholecystitis. Question small RIGHT hepatic lesion likely hemangioma or periportal fat.  MRCP revealed cholelithiasis without evidence of cholecystitis.  It showed mild biliary ductal dilatation with tiny less than 5 mm calculus in the distal common bile duct.  The patient was given 1 mg of IV Dilaudid, 1 L bolus of IV lactated Ringer and 3.375 g of IV Zosyn.  She will be admitted to a medical bed for further evaluation and management.  5/18: Patient is feeling much improved and waiting for ERCP when seen during  morning rounds. Worsening of liver enzymes, now elevated alkaline phosphatase and T. bili, picture more consistent with obstruction. Patient underwent successful ERCP with GI later today, s/p sphincterotomy and removal of bile stones. General surgery was also consulted for evaluation, they will decide about cholecystectomy either during current hospitalization or later as elective. Patient is currently breast-feeding.  5/19: Patient with decreasing AST and ALT after the ERCP, T. bili has been normalized, slight worsening of alkaline phosphatase.  Going for laparoscopic cholecystectomy today. No definitive source of infection at this time so sepsis ruled out.  5/20: Patient underwent successful laparoscopic cholecystectomy yesterday.  Tolerated the procedure well.  No complications noted.  Labs seems stable and improving.  Pain tolerable with current regimen.  Patient is being discharged home to follow-up with general surgery as an outpatient for further recommendations. Patient was given some Percocet and Zofran with instructions to avoid breast-feeding while taking them.  Assessment and Plan: * Choledocholithiasis S/p ERCP with sphincterotomy and removal of bile stones. Going for laparoscopic cholecystectomy with general surgery today. -Monitor for any perforation or signs of cholangitis after the procedure. -Continue with pain management -Continue supportive care -Continue with Zosyn  Elevated transaminase level Liver enzymes started improving after the ERCP as anticipated.  Slight worsening of alkaline phosphatase. -Monitor liver function -Continue with supportive care  Hypokalemia Resolved. -Replace potassium as needed and monitor   Pain control - [facility] Controlled Substance Reporting System database was reviewed. and patient was instructed, not to drive, operate heavy machinery, perform activities at heights, swimming or participation  in water activities or provide  baby-sitting services while on Pain, Sleep and Anxiety Medications; until their outpatient Physician has advised to do so again. Also recommended to not to take more than prescribed Pain, Sleep and Anxiety Medications.  Consultants: Gastroenterology, general surgery Procedures performed: ERCP.  Laparoscopic cholecystectomy Disposition: Home Diet recommendation:  Discharge Diet Orders (From admission, onward)     Start     Ordered   09/20/21 0000  Diet - low sodium heart healthy        09/20/21 1127           Regular diet DISCHARGE MEDICATION: Allergies as of 09/20/2021   No Known Allergies      Medication List     TAKE these medications    norethindrone 0.35 MG tablet Commonly known as: Ortho Micronor Take 1 tablet (0.35 mg total) by mouth daily.   ondansetron 4 MG tablet Commonly known as: ZOFRAN Take 1 tablet (4 mg total) by mouth every 6 (six) hours as needed for nausea.   oxyCODONE-acetaminophen 5-325 MG tablet Commonly known as: Percocet Take 1 tablet by mouth every 4 (four) hours as needed for severe pain.   prenatal multivitamin Tabs tablet Take 1 tablet by mouth daily at 12 noon.        Follow-up Information     Carolan Shiver, MD Follow up in 2 week(s).   Specialty: General Surgery Why: Follow up after cholecystectomy Contact information: [address] [place] [address] [phone]                Discharge Exam: Filed Weights   09/17/21 2050 09/19/21 1122  Weight: 65 kg 65 kg   General.     In no acute distress. Pulmonary.  Lungs clear bilaterally, normal respiratory effort. CV.  Regular rate and rhythm, no JVD, rub or murmur. Abdomen.  Soft, nontender, nondistended, BS positive. CNS.  Alert and oriented .  No focal neurologic deficit. Extremities.  No edema, no cyanosis, pulses intact and symmetrical. Psychiatry.  Judgment and insight appears normal.   Condition at discharge: stable  The results of significant  diagnostics from this hospitalization (including imaging, microbiology, ancillary and laboratory) are listed below for reference.   Imaging Studies: MR 3D Recon At Scanner  Result Date: 09/17/2021 CLINICAL DATA:  Right upper quadrant pain and vomiting. Elevated liver function tests. Cholelithiasis. Biliary ductal dilatation on recent ultrasound. EXAM: MRI ABDOMEN WITHOUT AND WITH CONTRAST (INCLUDING MRCP) TECHNIQUE: Multiplanar multisequence MR imaging of the abdomen was performed both before and after the administration of intravenous contrast. Heavily T2-weighted images of the biliary and pancreatic ducts were obtained, and three-dimensional MRCP images were rendered by post processing. CONTRAST:  7mL GADAVIST GADOBUTROL 1 MMOL/ML IV SOLN COMPARISON:  Ultrasound on 09/17/2021 FINDINGS: Lower chest: No acute findings. Hepatobiliary: No hepatic masses identified. Multiple tiny gallstones are seen, however there is no evidence of cholecystitis. Mild biliary ductal dilatation is seen, with common bile duct measuring 7 mm. A single tiny less than 5 mm calculus is seen in the distal common bile duct (e.g. Image 23/13). Pancreas: No mass or inflammatory changes. No evidence of pancreatic ductal dilatation. Spleen:  Within normal limits in size and appearance. Adrenals/Urinary Tract: No masses identified. No evidence of hydronephrosis. Stomach/Bowel: Unremarkable. Vascular/Lymphatic: No pathologically enlarged lymph nodes identified. No acute vascular findings. Other:  None. Musculoskeletal:  No suspicious bone lesions identified. IMPRESSION: Cholelithiasis. No radiographic evidence of cholecystitis. Mild biliary ductal dilatation, with tiny less than 5 mm calculus in  distal common bile duct. Electronically Signed   By: Danae Orleans M.D.   On: 09/17/2021 18:53   DG C-Arm 1-60 Min-No Report  Result Date: 09/18/2021 Fluoroscopy was utilized by the requesting physician.  No radiographic interpretation.   MR  ABDOMEN MRCP W WO CONTAST  Result Date: 09/17/2021 CLINICAL DATA:  Right upper quadrant pain and vomiting. Elevated liver function tests. Cholelithiasis. Biliary ductal dilatation on recent ultrasound. EXAM: MRI ABDOMEN WITHOUT AND WITH CONTRAST (INCLUDING MRCP) TECHNIQUE: Multiplanar multisequence MR imaging of the abdomen was performed both before and after the administration of intravenous contrast. Heavily T2-weighted images of the biliary and pancreatic ducts were obtained, and three-dimensional MRCP images were rendered by post processing. CONTRAST:  57mL GADAVIST GADOBUTROL 1 MMOL/ML IV SOLN COMPARISON:  Ultrasound on 09/17/2021 FINDINGS: Lower chest: No acute findings. Hepatobiliary: No hepatic masses identified. Multiple tiny gallstones are seen, however there is no evidence of cholecystitis. Mild biliary ductal dilatation is seen, with common bile duct measuring 7 mm. A single tiny less than 5 mm calculus is seen in the distal common bile duct (e.g. Image 23/13). Pancreas: No mass or inflammatory changes. No evidence of pancreatic ductal dilatation. Spleen:  Within normal limits in size and appearance. Adrenals/Urinary Tract: No masses identified. No evidence of hydronephrosis. Stomach/Bowel: Unremarkable. Vascular/Lymphatic: No pathologically enlarged lymph nodes identified. No acute vascular findings. Other:  None. Musculoskeletal:  No suspicious bone lesions identified. IMPRESSION: Cholelithiasis. No radiographic evidence of cholecystitis. Mild biliary ductal dilatation, with tiny less than 5 mm calculus in distal common bile duct. Electronically Signed   By: Danae Orleans M.D.   On: 09/17/2021 18:53   US Abdomen Limited RUQ (LIVER/GB)  Result Date: 09/17/2021 CLINICAL DATA:  RIGHT upper quadrant pain for 2 weeks with increasing severity. EXAM: ULTRASOUND ABDOMEN LIMITED RIGHT UPPER QUADRANT COMPARISON:  None Available. FINDINGS: Gallbladder: Numerous gallstones in the gallbladder. No reported  tenderness over the gallbladder. No wall thickening or pericholecystic fluid. Largest gallstone in the gallbladder approximately 4 mm. Common bile duct: Diameter: 7.4 mm Intrahepatic biliary tree with mild intrahepatic biliary duct distension as well. Liver: Normal hepatic echotexture. Subtle echogenic focus in the RIGHT lobe of the liver measuring 12 mm may represent averaging of periportal fat or small hemangioma not well assessed. Portal vein is patent on color Doppler imaging with normal direction of blood flow towards the liver. Other: None. IMPRESSION: Cholelithiasis without acute cholecystitis. Biliary duct obstruction with mild intra and extrahepatic biliary duct dilation. MRI/MRCP may be helpful for further evaluation. Question small RIGHT hepatic lesion likely hemangioma or periportal fat. Electronically Signed   By: Donzetta Kohut M.D.   On: 09/17/2021 15:10    Microbiology: Results for orders placed or performed during the hospital encounter of 09/17/21  Culture, blood (Routine X 2) w Reflex to ID Panel     Status: None (Preliminary result)   Collection Time: 09/17/21 10:59 PM   Specimen: BLOOD  Result Value Ref Range Status   Specimen Description BLOOD RIGHT ASSIST CONTROL  Final   Special Requests   Final    BOTTLES DRAWN AEROBIC AND ANAEROBIC Blood Culture results may not be optimal due to an excessive volume of blood received in culture bottles   Culture   Final    NO GROWTH 3 DAYS Performed at [facility], [address] [place], [place] [address]    Report Status PENDING  Incomplete  Culture, blood (Routine X 2) w Reflex to ID Panel     Status: None (  Preliminary result)   Collection Time: 09/18/21 12:44 AM   Specimen: BLOOD  Result Value Ref Range Status   Specimen Description BLOOD RIGHT ASSIST CONTROL  Final   Special Requests   Final    IN PEDIATRIC BOTTLE Blood Culture results may not be optimal due to an excessive volume of blood received in culture bottles    Culture   Final    NO GROWTH 2 DAYS Performed at [facility], [address] [place], [place] [address]    Report Status PENDING  Incomplete    Labs: CBC: Recent Labs  Lab 09/17/21 1341 09/18/21 0442 09/19/21 0347 09/20/21 0511  WBC 18.6* 7.9 9.5 12.2*  HGB 12.6 12.0 11.2* 10.3*  HCT 40.1 37.6 35.0* 32.8*  MCV 89.1 89.7 88.8 89.4  PLT 310 264 249 243   Basic Metabolic Panel: Recent Labs  Lab 09/17/21 1341 09/18/21 0442 09/19/21 0347 09/20/21 0511  NA 142 138 138 138  K 3.4* 3.4* 4.3 3.4*  CL 110 106 105 104  CO2 24 26 23 26   GLUCOSE 108* 85 102* 84  BUN 15 11 9 10   CREATININE 0.60 0.65 0.74 0.79  CALCIUM 8.6* 8.7* 8.8* 8.5*  MG  --   --  1.8  --    Liver Function Tests: Recent Labs  Lab 09/17/21 1341 09/18/21 0442 09/19/21 0347 09/20/21 0511  AST 132* 597* 139* 47*  ALT 69* 556* 319* 205*  ALKPHOS 110 158* 163* 130*  BILITOT 0.8 2.8* 1.0 0.6  PROT 7.0 6.5 6.1* 6.1*  ALBUMIN 3.8 3.5 3.3* 3.3*   CBG: No results for input(s): GLUCAP in the last 168 hours.  Discharge time spent: greater than 30 minutes.  This record has been created using Conservation officer, historic buildingsDragon voice recognition software. Errors have been sought and corrected,but may not always be located. Such creation errors do not reflect on the standard of care.   Signed: Arnetha CourserSumayya Ulysses Alper, MD [facility] 09/20/2021

## 2021-09-20 NOTE — Progress Notes (Signed)
Patient ID: Robin Campbell, female   DOB: Jul 30, 2000, 21 y.o.   MRN: [id number]     SURGICAL PROGRESS NOTE   Hospital Day(s): 3.   Interval History: Patient seen and examined, no acute events or new complaints overnight. Patient reports feeling well. Some soreness on incision sites. Tolerating diet.   Vital signs in last 24 hours: [min-max] current  Temp:  [97.8 F (36.6 C)-98.5 F (36.9 C)] 98.1 F (36.7 C) (05/20 0746) Pulse Rate:  [54-83] 55 (05/20 0746) Resp:  [14-24] 18 (05/20 0746) BP: (92-117)/(48-72) 111/67 (05/20 0746) SpO2:  [95 %-100 %] 100 % (05/20 0746) Weight:  [65 kg] 65 kg (05/19 1122)     Height: 5\' 3"  (160 cm) Weight: 65 kg BMI (Calculated): 25.39   Physical Exam:  Constitutional: alert, cooperative and no distress  Respiratory: breathing non-labored at rest  Cardiovascular: regular rate and sinus rhythm  Gastrointestinal: soft, non-tender, and non-distended  Labs:     Latest Ref Rng & Units 09/20/2021    5:11 AM 09/19/2021    3:47 AM 09/18/2021    4:42 AM  CBC  WBC 4.0 - 10.5 K/uL 12.2   9.5   7.9    Hemoglobin 12.0 - 15.0 g/dL 09/20/2021   56.8   12.7    Hematocrit 36.0 - 46.0 % 32.8   35.0   37.6    Platelets 150 - 400 K/uL 243   249   264        Latest Ref Rng & Units 09/20/2021    5:11 AM 09/19/2021    3:47 AM 09/18/2021    4:42 AM  CMP  Glucose 70 - 99 mg/dL 84   09/20/2021   85    BUN 6 - 20 mg/dL 10   9   11     Creatinine 0.44 - 1.00 mg/dL 001     7.49    Sodium 135 - 145 mmol/L 138   138   138    Potassium 3.5 - 5.1 mmol/L 3.4   4.3   3.4    Chloride 98 - 111 mmol/L 104   105   106    CO2 22 - 32 mmol/L 26   23   26     Calcium 8.9 - 10.3 mg/dL 8.5   8.8   8.7    Total Protein 6.5 - 8.1 g/dL 6.1   6.1   6.5    Total Bilirubin 0.3 - 1.2 mg/dL 0.6   1.0   2.8    Alkaline Phos 38 - 126 U/L 130   163   158    AST 15 - 41 U/L 47   139   597    ALT 0 - 44 U/L 205   319   556      Imaging studies: No new pertinent imaging studies   Assessment/Plan:   21 y.o. female with history of choledocholithiasis 1 Day Post-Op s/p robotic cholecystectomy.   - Recovering well   - Pain controlled   - Tolerating diet   - May be discharged from surgical standpoint             - Will follow in office in 2 weeks.    6.75, MD

## 2021-09-20 NOTE — Progress Notes (Signed)
Discharge instructions reviewed with patient at bedside. PIV removed with no complications. Patient states she has no further questions or concerns at this time. Patient is safe and stable to DC home

## 2021-09-22 LAB — CULTURE, BLOOD (ROUTINE X 2): Culture: NO GROWTH

## 2021-09-23 LAB — CULTURE, BLOOD (ROUTINE X 2): Culture: NO GROWTH

## 2021-09-23 LAB — SURGICAL PATHOLOGY

## 2021-09-24 DIAGNOSIS — K8050 Calculus of bile duct without cholangitis or cholecystitis without obstruction: Secondary | ICD-10-CM | POA: Diagnosis not present

## 2021-10-02 DIAGNOSIS — Z419 Encounter for procedure for purposes other than remedying health state, unspecified: Secondary | ICD-10-CM | POA: Diagnosis not present

## 2021-11-01 DIAGNOSIS — Z419 Encounter for procedure for purposes other than remedying health state, unspecified: Secondary | ICD-10-CM | POA: Diagnosis not present

## 2021-12-02 DIAGNOSIS — Z419 Encounter for procedure for purposes other than remedying health state, unspecified: Secondary | ICD-10-CM | POA: Diagnosis not present

## 2022-01-02 DIAGNOSIS — Z419 Encounter for procedure for purposes other than remedying health state, unspecified: Secondary | ICD-10-CM | POA: Diagnosis not present

## 2022-02-01 DIAGNOSIS — Z419 Encounter for procedure for purposes other than remedying health state, unspecified: Secondary | ICD-10-CM | POA: Diagnosis not present

## 2022-03-04 DIAGNOSIS — Z419 Encounter for procedure for purposes other than remedying health state, unspecified: Secondary | ICD-10-CM | POA: Diagnosis not present

## 2022-04-03 DIAGNOSIS — Z419 Encounter for procedure for purposes other than remedying health state, unspecified: Secondary | ICD-10-CM | POA: Diagnosis not present

## 2022-05-04 DIAGNOSIS — Z419 Encounter for procedure for purposes other than remedying health state, unspecified: Secondary | ICD-10-CM | POA: Diagnosis not present

## 2022-06-04 DIAGNOSIS — Z419 Encounter for procedure for purposes other than remedying health state, unspecified: Secondary | ICD-10-CM | POA: Diagnosis not present

## 2022-06-19 ENCOUNTER — Encounter: Payer: Self-pay | Admitting: Obstetrics

## 2022-06-19 ENCOUNTER — Ambulatory Visit (INDEPENDENT_AMBULATORY_CARE_PROVIDER_SITE_OTHER): Payer: Medicaid Other | Admitting: Obstetrics

## 2022-06-19 ENCOUNTER — Other Ambulatory Visit (HOSPITAL_COMMUNITY)
Admission: RE | Admit: 2022-06-19 | Discharge: 2022-06-19 | Disposition: A | Discharge status: Home / Self Care | Payer: Medicaid Other | Source: Ambulatory Visit | Attending: Obstetrics | Admitting: Obstetrics

## 2022-06-19 VITALS — BP 98/51 | HR 84 | Resp 16 | Ht 63.0 in | Wt 163.0 lb

## 2022-06-19 DIAGNOSIS — N812 Incomplete uterovaginal prolapse: Secondary | ICD-10-CM | POA: Insufficient documentation

## 2022-06-19 DIAGNOSIS — N814 Uterovaginal prolapse, unspecified: Secondary | ICD-10-CM

## 2022-06-19 DIAGNOSIS — N898 Other specified noninflammatory disorders of vagina: Secondary | ICD-10-CM | POA: Insufficient documentation

## 2022-06-19 NOTE — Progress Notes (Signed)
    GYNECOLOGY PROGRESS NOTE  Subjective:    Patient ID: Robin Campbell, female    DOB: 08-08-2000, 22 y.o.   MRN: [id number]  HPI  Patient is a 22 y.o. G70P1001 female who presents for evaluation of vaginal irritation and odor x 2 months. She self treated with OTC boric acid suppositories for 3 nights about 2 weeks ago with no relief. She also has concerns about a piece of skin in her vaginal area, she describes as a skin tag.   The following portions of the patient's history were reviewed and updated as appropriate: allergies, current medications, past family history, past medical history, past social history, past surgical history, and problem list.  Review of Systems Pertinent items noted in HPI and remainder of comprehensive ROS otherwise negative.   Objective:   Blood pressure (!) 98/51, pulse 84, resp. rate 16, height 5' 3"$  (1.6 m), weight 163 lb (73.9 kg), last menstrual period 05/22/2022, unknown if currently breastfeeding. Body mass index is 28.87 kg/m. General appearance: alert, cooperative, and no distress Abdomen: soft, non-tender; bowel sounds normal; no masses,  no organomegaly Pelvic:  No external lesions noted. Some vaginal mucosa or part of the hymen protrude slightly at her introitus. cervix normal in appearance, external genitalia normal, no adnexal masses or tenderness, no cervical motion tenderness, positive findings: uterine prolapse or noticed with bimanual., rectovaginal septum normal, uterus normal size, shape, and consistency, and vagina normal without discharge Extremities: extremities normal, atraumatic, no cyanosis or edema Neurologic: Grossly normal   Assessment:   1. Vaginal irritation   2. 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 Vaginal irritatiowill send aptima for testing. Will treat per the results. - Cervicovaginal ancillary only  2. Vaginal odor- aptima  retrieved.  Cervicovaginal ancillary only  3. Uterine prolapse noted- this is discussed with the patient. I have recommended we start with a referral to pelvic floor PT.she is taught some Kegal exercises  4. Discussed that she can RTC to see one of the MDs Hulan Fray or [place]) for removal of her hymenal "tag"  Gigi Gin, [place]

## 2022-06-19 NOTE — Patient Instructions (Signed)
Vaginal Yeast Infection, Adult  Vaginal yeast infection is a condition that causes vaginal discharge as well as soreness, swelling, and redness (inflammation) of the vagina. This is a common condition. Some women get this infection frequently. What are the causes? This condition is caused by a change in the normal balance of the yeast (Candida) and normal bacteria that live in the vagina. This change causes an overgrowth of yeast, which causes the inflammation. What increases the risk? The condition is more likely to develop in women who: Take antibiotic medicines. Have diabetes. Take birth control pills. Are pregnant. Douche often. Have a weak body defense system (immune system). Have been taking steroid medicines for a long time. Frequently wear tight clothing. What are the signs or symptoms? Symptoms of this condition include: White, thick, creamy vaginal discharge. Swelling, itching, redness, and irritation of the vagina. The lips of the vagina (labia) may be affected as well. Pain or a burning feeling while urinating. Pain during sex. How is this diagnosed? This condition is diagnosed based on: Your medical history. A physical exam. A pelvic exam. Your health care provider will examine a sample of your vaginal discharge under a microscope. Your health care provider may send this sample for testing to confirm the diagnosis. How is this treated? This condition is treated with medicine. Medicines may be over-the-counter or prescription. You may be told to use one or more of the following: Medicine that is taken by mouth (orally). Medicine that is applied as a cream (topically). Medicine that is inserted directly into the vagina (suppository). Follow these instructions at home: Take or apply over-the-counter and prescription medicines only as told by your health care provider. Do not use tampons until your health care provider approves. Do not have sex until your infection has  cleared. Sex can prolong or worsen your symptoms of infection. Ask your health care provider when it is safe to resume sexual activity. Keep all follow-up visits. This is important. How is this prevented?  Do not wear tight clothes, such as pantyhose or tight pants. Wear breathable cotton underwear. Do not use douches, perfumed soap, creams, or powders. Wipe from front to back after using the toilet. If you have diabetes, keep your blood sugar levels under control. Ask your health care provider for other ways to prevent yeast infections. Contact a health care provider if: You have a fever. Your symptoms go away and then return. Your symptoms do not get better with treatment. Your symptoms get worse. You have new symptoms. You develop blisters in or around your vagina. You have blood coming from your vagina and it is not your menstrual period. You develop pain in your abdomen. Summary Vaginal yeast infection is a condition that causes discharge as well as soreness, swelling, and redness (inflammation) of the vagina. This condition is treated with medicine. Medicines may be over-the-counter or prescription. Take or apply over-the-counter and prescription medicines only as told by your health care provider. Do not douche. Resume sexual activity or use of tampons as instructed by your health care provider. Contact a health care provider if your symptoms do not get better with treatment or your symptoms go away and then return. This information is not intended to replace advice given to you by your health care provider. Make sure you discuss any questions you have with your health care provider. Document Revised: 07/08/2020 Document Reviewed: 07/08/2020 Elsevier Patient Education  [place]. Bacterial Vaginosis  Bacterial vaginosis is an infection that occurs when the  normal balance of bacteria in the vagina changes. This change is caused by an overgrowth of certain bacteria in the  vagina. Bacterial vaginosis is the most common vaginal infection among females aged 24 to 51 years. This condition increases the risk of sexually transmitted infections (STIs). Treatment can help reduce this risk. Treatment is very important for pregnant women because this condition can cause babies to be born early (prematurely) or at a low birth weight. What are the causes? This condition is caused by an increase in harmful bacteria that are normally present in small amounts in the vagina. However, the exact reason this condition develops is not known. You cannot get bacterial vaginosis from toilet seats, bedding, swimming pools, or contact with objects around you. What increases the risk? The following factors may make you more likely to develop this condition: Having a new sexual partner or multiple sexual partners, or having unprotected sex. Douching. Having an intrauterine device (IUD). Smoking. Abusing drugs and alcohol. This may lead to riskier sexual behavior. Taking certain antibiotic medicines. Being pregnant. What are the signs or symptoms? Some women with this condition have no symptoms. Symptoms may include: Pearline Cables or white vaginal discharge. The discharge can be watery or foamy. A fish-like odor with discharge, especially after sex or during menstruation. Itching in and around the vagina. Burning or pain with urination. How is this diagnosed? This condition is diagnosed based on: Your medical history. A physical exam of the vagina. Checking a sample of vaginal fluid for harmful bacteria or abnormal cells. How is this treated? This condition is treated with antibiotic medicines. These may be given as a pill, a vaginal cream, or a medicine that is put into the vagina (suppository). If the condition comes back after treatment, a second round of antibiotics may be needed. Follow these instructions at home: Medicines Take or apply over-the-counter and prescription medicines only  as told by your health care provider. Take or apply your antibiotic medicine as told by your health care provider. Do not stop using the antibiotic even if you start to feel better. General instructions If you have a female sexual partner, tell her that you have a vaginal infection. She should follow up with her health care provider. If you have a female sexual partner, he does not need treatment. Avoid sexual activity until you finish treatment. Drink enough fluid to keep your urine pale yellow. Keep the area around your vagina and rectum clean. Wash the area daily with warm water. Wipe yourself from front to back after using the toilet. If you are breastfeeding, talk to your health care provider about continuing breastfeeding during treatment. Keep all follow-up visits. This is important. How is this prevented? Self-care Do not douche. Wash the outside of your vagina with warm water only. Wear cotton or cotton-lined underwear. Avoid wearing tight pants and pantyhose, especially during the summer. Safe sex Use protection when having sex. This includes: Using condoms. Using dental dams. This is a thin layer of a material made of latex or polyurethane that protects the mouth during oral sex. Limit the number of sexual partners. To help prevent bacterial vaginosis, it is best to have sex with just one partner (monogamous relationship). Make sure you and your sexual partner are tested for STIs. Drugs and alcohol Do not use any products that contain nicotine or tobacco. These products include cigarettes, chewing tobacco, and vaping devices, such as e-cigarettes. If you need help quitting, ask your health care provider. Do not use drugs. Do  not drink alcohol if: Your health care provider tells you not to do this. You are pregnant, may be pregnant, or are planning to become pregnant. If you drink alcohol: Limit how much you have to 0-1 drink a day. Be aware of how much alcohol is in your  drink. In the [place] one drink equals one 12 oz bottle of beer (355 mL), one 5 oz glass of wine (148 mL), or one 1 oz glass of hard liquor (44 mL). Where to find more information Centers for Disease Control and Prevention: http://www.wolf.info/ American Sexual Health Association (ASHA): www.ashastd.org [place].S. Department of Health and Financial controller, Office on Women's Health: [place] Contact a health care provider if: Your symptoms do not improve, even after treatment. You have more discharge or pain when urinating. You have a fever or chills. You have pain in your abdomen or pelvis. You have pain during sex. You have vaginal bleeding between menstrual periods. Summary Bacterial vaginosis is a vaginal infection that occurs when the normal balance of bacteria in the vagina changes. It results from an overgrowth of certain bacteria. This condition increases the risk of sexually transmitted infections (STIs). Getting treated can help reduce this risk. Treatment is very important for pregnant women because this condition can cause babies to be born early (prematurely) or at low birth weight. This condition is treated with antibiotic medicines. These may be given as a pill, a vaginal cream, or a medicine that is put into the vagina (suppository). This information is not intended to replace advice given to you by your health care provider. Make sure you discuss any questions you have with your health care provider. Document Revised: 10/19/2019 Document Reviewed: 10/19/2019 Elsevier Patient Education  [place].

## 2022-06-23 LAB — CERVICOVAGINAL ANCILLARY ONLY
Bacterial Vaginitis (gardnerella): NEGATIVE
Candida Glabrata: NEGATIVE
Candida Vaginitis: NEGATIVE
Comment: NEGATIVE
Comment: NEGATIVE
Comment: NEGATIVE

## 2022-06-29 ENCOUNTER — Encounter: Payer: Self-pay | Admitting: Obstetrics & Gynecology

## 2022-06-29 ENCOUNTER — Ambulatory Visit (INDEPENDENT_AMBULATORY_CARE_PROVIDER_SITE_OTHER): Payer: Medicaid Other | Admitting: Obstetrics & Gynecology

## 2022-06-29 VITALS — BP 113/69 | HR 73 | Ht 63.0 in | Wt 163.0 lb

## 2022-06-29 DIAGNOSIS — N898 Other specified noninflammatory disorders of vagina: Secondary | ICD-10-CM

## 2022-06-29 NOTE — Progress Notes (Signed)
    GYNECOLOGY PROGRESS NOTE  Subjective:    Patient ID: Robin Campbell, female    DOB: August 06, 2000, 22 y.o.   MRN: [id number]  HPI  Patient is a 22 y.o. G47P1001 female who presents for  removal of hymenal remnants. There are 2 and they are dangling and getting stuck in her panties. They have made sex uncomfortable as well. She would very much like to have them removed.   The following portions of the patient's history were reviewed and updated as appropriate: allergies, current medications, past family history, past medical history, past social history, past surgical history, and problem list.  Review of Systems Pertinent items are noted in HPI.   Objective:   Blood pressure 113/69, pulse 73, height 5' 3"$  (1.6 m), weight 163 lb (73.9 kg), last menstrual period 05/22/2022, unknown if currently breastfeeding. Body mass index is 28.87 kg/m. General appearance: alert Abdomen: soft, non-tender; bowel sounds normal; no masses,  no organomegaly Pelvic: EG- hymenal remnants, each dangling about 2-3 cm when on stretch, one at the 8 o'clock position and the other at the 4 o'clock position Extremities: extremities normal, atraumatic, no cyanosis or edema Neurologic: Grossly normal   Assessment:   Dyspareunia with hymenal remnants  Plan:   I would recommend that we use the LEEP cautery for hemostasis. We are out of the tubing today so she will reschedule this procedure.

## 2022-06-30 ENCOUNTER — Encounter: Payer: Self-pay | Admitting: Obstetrics & Gynecology

## 2022-07-03 ENCOUNTER — Ambulatory Visit: Payer: Medicaid Other | Admitting: Obstetrics & Gynecology

## 2022-07-03 DIAGNOSIS — Z419 Encounter for procedure for purposes other than remedying health state, unspecified: Secondary | ICD-10-CM | POA: Diagnosis not present

## 2022-07-10 ENCOUNTER — Ambulatory Visit (INDEPENDENT_AMBULATORY_CARE_PROVIDER_SITE_OTHER): Payer: Medicaid Other | Admitting: Obstetrics & Gynecology

## 2022-07-10 ENCOUNTER — Encounter: Payer: Self-pay | Admitting: Obstetrics & Gynecology

## 2022-07-10 VITALS — BP 120/80 | Ht 63.0 in | Wt 161.0 lb

## 2022-07-10 DIAGNOSIS — N898 Other specified noninflammatory disorders of vagina: Secondary | ICD-10-CM

## 2022-07-10 DIAGNOSIS — N9419 Other specified dyspareunia: Secondary | ICD-10-CM

## 2022-07-10 NOTE — Progress Notes (Signed)
    GYNECOLOGY PROGRESS NOTE  Subjective:    Patient ID: Robin Campbell, female    DOB: 09-07-2000, 22 y.o.   MRN: [id number]  HPI  Patient is a 22 y.o. G56P1001 female who presents for removal of 2 hymenal remnants that are dangling and causing dyspareunia, getting stuck in the edge of her panties and generally causing her discomfort. She is aware that this is an elective procedure.  She is aware that she has a rectocele and this will not fix that issue.    Review of Systems    Objective:   Blood pressure 120/80, height 5\' 3"  (1.6 m), weight 161 lb (73 kg), last menstrual period 06/19/2022, unknown if currently breastfeeding. Body mass index is 28.52 kg/m. Well nourished, well hydrated White female, no apparent distress She is ambulating and conversing normally. I examined her vaginal introitus and confirmed with her which pieces of the hymen that are causing her discomfort. I did a rectal exam to determine exactly where the rectum is in the rectocele. I sprayed the area with Hurricaine spray and then injected 2% lidocaine with epinephrine. I assured hemostasis and then used the LEEP machine with the 2 cm LEEP tip to excise the dangling hymenal remnants. I gently cauterized the base of each and then used a 4-0 vicryl suture to do a purse string closure of the areas. Excellent cosmetic results were obtained.  She tolerated the procedure well.   Assessment:   1. Hymenal remnant      Plan:   1. Hymenal remnant - s/p removal She will have pelvic rest for 2 weeks and see me in the near future for follow up.

## 2022-07-14 ENCOUNTER — Encounter: Payer: Self-pay | Admitting: Obstetrics & Gynecology

## 2022-08-03 DIAGNOSIS — Z419 Encounter for procedure for purposes other than remedying health state, unspecified: Secondary | ICD-10-CM | POA: Diagnosis not present

## 2022-09-02 DIAGNOSIS — Z419 Encounter for procedure for purposes other than remedying health state, unspecified: Secondary | ICD-10-CM | POA: Diagnosis not present

## 2022-10-03 DIAGNOSIS — Z419 Encounter for procedure for purposes other than remedying health state, unspecified: Secondary | ICD-10-CM | POA: Diagnosis not present

## 2022-11-02 DIAGNOSIS — Z419 Encounter for procedure for purposes other than remedying health state, unspecified: Secondary | ICD-10-CM | POA: Diagnosis not present

## 2022-12-03 DIAGNOSIS — Z419 Encounter for procedure for purposes other than remedying health state, unspecified: Secondary | ICD-10-CM | POA: Diagnosis not present

## 2023-01-03 DIAGNOSIS — Z419 Encounter for procedure for purposes other than remedying health state, unspecified: Secondary | ICD-10-CM | POA: Diagnosis not present

## 2023-02-02 DIAGNOSIS — Z419 Encounter for procedure for purposes other than remedying health state, unspecified: Secondary | ICD-10-CM | POA: Diagnosis not present

## 2023-02-03 ENCOUNTER — Ambulatory Visit: Payer: Medicaid Other

## 2023-02-03 VITALS — BP 104/66 | Ht 63.0 in | Wt 171.0 lb

## 2023-02-03 DIAGNOSIS — Z3201 Encounter for pregnancy test, result positive: Secondary | ICD-10-CM

## 2023-02-03 DIAGNOSIS — Z3200 Encounter for pregnancy test, result unknown: Secondary | ICD-10-CM

## 2023-02-03 LAB — POCT URINE PREGNANCY: Preg Test, Ur: POSITIVE — AB

## 2023-02-03 NOTE — Progress Notes (Signed)
NURSE VISIT NOTE  Subjective:    Patient ID: Robin Campbell, female    DOB: 03/08/2001, 22 y.o.   MRN: [id number]  HPI  Patient is a 22 y.o. G80P1001 female who presents for evaluation of amenorrhea. She believes she could be pregnant. Current symptoms also include: breast tenderness, fatigue, morning sickness, nausea, and positive home pregnancy test. Last period was normal.    Objective:    BP 104/66   Ht 5\' 3"  (1.6 m)   Wt 171 lb (77.6 kg)   LMP 11/19/2022 (Exact Date)   Breastfeeding Unknown   BMI 30.29 kg/m   Lab Review  Results for orders placed or performed in visit on 02/03/23  POCT urine pregnancy  Result Value Ref Range   Preg Test, Ur Positive (A) Negative    Assessment:   1. Possible pregnancy, not confirmed     Plan:   Pregnancy Test: Positive  BP Cuff Measurement taken. Cuff Size Adult Small Encouraged well-balanced diet, plenty of rest when needed, pre-natal vitamins daily and walking for exercise.  Discussed self-help for nausea, avoiding OTC medications until consulting provider or pharmacist, other than Tylenol as needed, minimal caffeine (1-2 cups daily) and avoiding alcohol.   She will schedule her nurse visit @ 7-[redacted] wks pregnant, u/s for dating @10  wk, and NOB visit at [redacted] wk pregnant.    Feel free to call with any questions.     Loney Laurence, CMA

## 2023-02-05 ENCOUNTER — Ambulatory Visit (INDEPENDENT_AMBULATORY_CARE_PROVIDER_SITE_OTHER): Payer: Medicaid Other

## 2023-02-05 VITALS — Wt 171.0 lb

## 2023-02-05 DIAGNOSIS — Z3689 Encounter for other specified antenatal screening: Secondary | ICD-10-CM

## 2023-02-05 DIAGNOSIS — Z369 Encounter for antenatal screening, unspecified: Secondary | ICD-10-CM

## 2023-02-05 DIAGNOSIS — Z3480 Encounter for supervision of other normal pregnancy, unspecified trimester: Secondary | ICD-10-CM | POA: Insufficient documentation

## 2023-02-05 NOTE — Patient Instructions (Signed)
First Trimester of Pregnancy  The first trimester of pregnancy starts on the first day of your last menstrual period until the end of week 12. This is also called months 1 through 3 of pregnancy. Body changes during your first trimester Your body goes through many changes during pregnancy. The changes usually return to normal after your baby is born. Physical changes You may gain or lose weight. Your breasts may grow larger and hurt. The area around your nipples may get darker. Dark spots or blotches may develop on your face. You may have changes in your hair. Health changes You may feel like you might vomit (nauseous), and you may vomit. You may have heartburn. You may have headaches. You may have trouble pooping (constipation). Your gums may bleed. Other changes You may get tired easily. You may pee (urinate) more often. Your menstrual periods will stop. You may not feel hungry. You may want to eat certain kinds of food. You may have changes in your emotions from day to day. You may have more dreams. Follow these instructions at home: Medicines Take over-the-counter and prescription medicines only as told by your doctor. Some medicines are not safe during pregnancy. Take a prenatal vitamin that contains at least 600 micrograms (mcg) of folic acid. Eating and drinking Eat healthy meals that include: Fresh fruits and vegetables. Whole grains. Good sources of protein, such as meat, eggs, or tofu. Low-fat dairy products. Avoid raw meat and unpasteurized juice, milk, and cheese. If you feel like you may vomit, or you vomit: Eat 4 or 5 small meals a day instead of 3 large meals. Try eating a few soda crackers. Drink liquids between meals instead of during meals. You may need to take these actions to prevent or treat trouble pooping: Drink enough fluids to keep your pee (urine) pale yellow. Eat foods that are high in fiber. These include beans, whole grains, and fresh fruits and  vegetables. Limit foods that are high in fat and sugar. These include fried or sweet foods. Activity Exercise only as told by your doctor. Most people can do their usual exercise routine during pregnancy. Stop exercising if you have cramps or pain in your lower belly (abdomen) or low back. Do not exercise if it is too hot or too humid, or if you are in a place of great height (high altitude). Avoid heavy lifting. If you choose to, you may have sex unless your doctor tells you not to. Relieving pain and discomfort Wear a good support bra if your breasts are sore. Rest with your legs raised (elevated) if you have leg cramps or low back pain. If you have bulging veins (varicose veins) in your legs: Wear support hose as told by your doctor. Raise your feet for 15 minutes, 3-4 times a day. Limit salt in your food. Safety Wear your seat belt at all times when you are in a car. Talk with your doctor if someone is hurting you or yelling at you. Talk with your doctor if you are feeling sad or have thoughts of hurting yourself. Lifestyle Do not use hot tubs, steam rooms, or saunas. Do not douche. Do not use tampons or scented sanitary pads. Do not use herbal medicines, illegal drugs, or medicines that are not approved by your doctor. Do not drink alcohol. Do not smoke or use any products that contain nicotine or tobacco. If you need help quitting, ask your doctor. Avoid cat litter boxes and soil that is used by cats. These carry   germs that can cause harm to the baby and can cause a loss of your baby by miscarriage or stillbirth. General instructions Keep all follow-up visits. This is important. Ask for help if you need counseling or if you need help with nutrition. Your doctor can give you advice or tell you where to go for help. Visit your dentist. At home, brush your teeth with a soft toothbrush. Floss gently. Write down your questions. Take them to your prenatal visits. Where to find more  information American Pregnancy Association: americanpregnancy.org American College of Obstetricians and Gynecologists: www.acog.org Office on Women's Health: womenshealth.gov/pregnancy Contact a doctor if: You are dizzy. You have a fever. You have mild cramps or pressure in your lower belly. You have a nagging pain in your belly area. You continue to feel like you may vomit, you vomit, or you have watery poop (diarrhea) for 24 hours or longer. You have a bad-smelling fluid coming from your vagina. You have pain when you pee. You are exposed to a disease that spreads from person to person, such as chickenpox, measles, Zika virus, HIV, or hepatitis. Get help right away if: You have spotting or bleeding from your vagina. You have very bad belly cramping or pain. You have shortness of breath or chest pain. You have any kind of injury, such as from a fall or a car crash. You have new or increased pain, swelling, or redness in an arm or leg. Summary The first trimester of pregnancy starts on the first day of your last menstrual period until the end of week 12 (months 1 through 3). Eat 4 or 5 small meals a day instead of 3 large meals. Do not smoke or use any products that contain nicotine or tobacco. If you need help quitting, ask your doctor. Keep all follow-up visits. This information is not intended to replace advice given to you by your health care provider. Make sure you discuss any questions you have with your health care provider. Document Revised: 09/27/2019 Document Reviewed: 08/03/2019 Elsevier Patient Education  2024 [employer] Commonly Asked Questions During Pregnancy  Cats: A parasite can be excreted in cat feces.  To avoid exposure you need to have another person empty the little box.  If you must empty the litter box you will need to wear gloves.  Wash your hands after handling your cat.  This parasite can also be found in raw or undercooked meat so this should also be  avoided.  Colds, Sore Throats, Flu: Please check your medication sheet to see what you can take for symptoms.  If your symptoms are unrelieved by these medications please call the office.  Dental Work: Most any dental work your dentist recommends is permitted.  X-rays should only be taken during the first trimester if absolutely necessary.  Your abdomen should be shielded with a lead apron during all x-rays.  Please notify your provider prior to receiving any x-rays.  Novocaine is fine; gas is not recommended.  If your dentist requires a note from us prior to dental work please call the office and we will provide one for you.  Exercise: Exercise is an important part of staying healthy during your pregnancy.  You may continue most exercises you were accustomed to prior to pregnancy.  Later in your pregnancy you will most likely notice you have difficulty with activities requiring balance like riding a bicycle.  It is important that you listen to your body and avoid activities that put you at a higher   risk of falling.  Adequate rest and staying well hydrated are a must!  If you have questions about the safety of specific activities ask your provider.    Exposure to Children with illness: Try to avoid obvious exposure; report any symptoms to us when noted,  If you have chicken pos, red measles or mumps, you should be immune to these diseases.   Please do not take any vaccines while pregnant unless you have checked with your OB provider.  Fetal Movement: After 28 weeks we recommend you do "kick counts" twice daily.  Lie or sit down in a calm quiet environment and count your baby movements "kicks".  You should feel your baby at least 10 times per hour.  If you have not felt 10 kicks within the first hour get up, walk around and have something sweet to eat or drink then repeat for an additional hour.  If count remains less than 10 per hour notify your provider.  Fumigating: Follow your pest control agent's  advice as to how long to stay out of your home.  Ventilate the area well before re-entering.  Hemorrhoids:   Most over-the-counter preparations can be used during pregnancy.  Check your medication to see what is safe to use.  It is important to use a stool softener or fiber in your diet and to drink lots of liquids.  If hemorrhoids seem to be getting worse please call the office.   Hot Tubs:  Hot tubs Jacuzzis and saunas are not recommended while pregnant.  These increase your internal body temperature and should be avoided.  Intercourse:  Sexual intercourse is safe during pregnancy as long as you are comfortable, unless otherwise advised by your provider.  Spotting may occur after intercourse; report any bright red bleeding that is heavier than spotting.  Labor:  If you know that you are in labor, please go to the hospital.  If you are unsure, please call the office and let us help you decide what to do.  Lifting, straining, etc:  If your job requires heavy lifting or straining please check with your provider for any limitations.  Generally, you should not lift items heavier than that you can lift simply with your hands and arms (no back muscles)  Painting:  Paint fumes do not harm your pregnancy, but may make you ill and should be avoided if possible.  Latex or water based paints have less odor than oils.  Use adequate ventilation while painting.  Permanents & Hair Color:  Chemicals in hair dyes are not recommended as they cause increase hair dryness which can increase hair loss during pregnancy.  " Highlighting" and permanents are allowed.  Dye may be absorbed differently and permanents may not hold as well during pregnancy.  Sunbathing:  Use a sunscreen, as skin burns easily during pregnancy.  Drink plenty of fluids; avoid over heating.  Tanning Beds:  Because their possible side effects are still unknown, tanning beds are not recommended.  Ultrasound Scans:  Routine ultrasounds are performed  at approximately 20 weeks.  You will be able to see your baby's general anatomy an if you would like to know the gender this can usually be determined as well.  If it is questionable when you conceived you may also receive an ultrasound early in your pregnancy for dating purposes.  Otherwise ultrasound exams are not routinely performed unless there is a medical necessity.  Although you can request a scan we ask that you pay for it when   conducted because insurance does not cover " patient request" scans.  Work: If your pregnancy proceeds without complications you may work until your due date, unless your physician or employer advises otherwise.  Round Ligament Pain/Pelvic Discomfort:  Sharp, shooting pains not associated with bleeding are fairly common, usually occurring in the second trimester of pregnancy.  They tend to be worse when standing up or when you remain standing for long periods of time.  These are the result of pressure of certain pelvic ligaments called "round ligaments".  Rest, Tylenol and heat seem to be the most effective relief.  As the womb and fetus grow, they rise out of the pelvis and the discomfort improves.  Please notify the office if your pain seems different than that described.  It may represent a more serious condition.  Common Medications Safe in Pregnancy  Acne:      Constipation:  Benzoyl Peroxide     Colace  Clindamycin      Dulcolax Suppository  Topica Erythromycin     Fibercon  Salicylic Acid      Metamucil         Miralax AVOID:        Senakot   Accutane    Cough:  Retin-A       Cough Drops  Tetracycline      Phenergan w/ Codeine if Rx  Minocycline      Robitussin (Plain & DM)  Antibiotics:     Crabs/Lice:  Ceclor       RID  Cephalosporins    AVOID:  E-Mycins      Kwell  Keflex  Macrobid/Macrodantin   Diarrhea:  Penicillin      Kao-Pectate  Zithromax      Imodium AD         PUSH FLUIDS AVOID:       Cipro     Fever:  Tetracycline      Tylenol (Regular  or Extra  Minocycline       Strength)  Levaquin      Extra Strength-Do not          Exceed 8 tabs/24 hrs Caffeine:        <200mg/day (equiv. To 1 cup of coffee or  approx. 3 12 oz sodas)         Gas: Cold/Hayfever:       Gas-X  Benadryl      Mylicon  Claritin       Phazyme  **Claritin-D        Chlor-Trimeton    Headaches:  Dimetapp      ASA-Free Excedrin  Drixoral-Non-Drowsy     Cold Compress  Mucinex (Guaifenasin)     Tylenol (Regular or Extra  Sudafed/Sudafed-12 Hour     Strength)  **Sudafed PE Pseudoephedrine   Tylenol Cold & Sinus     Vicks Vapor Rub  Zyrtec  **AVOID if Problems With Blood Pressure         Heartburn: Avoid lying down for at least 1 hour after meals  Aciphex      Maalox     Rash:  Milk of Magnesia     Benadryl    Mylanta       1% Hydrocortisone Cream  Pepcid  Pepcid Complete   Sleep Aids:  Prevacid      Ambien   Prilosec       Benadryl  Rolaids       Chamomile Tea  Tums (Limit 4/day)     Unisom           Tylenol PM         Warm milk-add vanilla or  Hemorrhoids:       Sugar for taste  Anusol/Anusol H.C.  (RX: Analapram 2.5%)  Sugar Substitutes:  Hydrocortisone OTC     Ok in moderation  Preparation H      Tucks        Vaseline lotion applied to tissue with wiping    Herpes:     Throat:  Acyclovir      Oragel  Famvir  Valtrex     Vaccines:         Flu Shot Leg Cramps:       *Gardasil  Benadryl      Hepatitis A         Hepatitis B Nasal Spray:       Pneumovax  Saline Nasal Spray     Polio Booster         Tetanus Nausea:       Tuberculosis test or PPD  Vitamin B6 25 mg TID   AVOID:    Dramamine      *Gardasil  Emetrol       Live Poliovirus  Ginger Root 250 mg QID    MMR (measles, mumps &  High Complex Carbs @ Bedtime    rebella)  Sea Bands-Accupressure    Varicella (Chickenpox)  Unisom 1/2 tab TID     *No known complications           If received before Pain:         Known pregnancy;   Darvocet       Resume series  after  Lortab        Delivery  Percocet    Yeast:   Tramadol      Femstat  Tylenol 3      Gyne-lotrimin  Ultram       Monistat  Vicodin           MISC:         All Sunscreens           Hair Coloring/highlights          Insect Repellant's          (Including DEET)         Mystic Tans  

## 2023-02-05 NOTE — Progress Notes (Signed)
New OB Intake  I connected with  Robin Campbell on 02/05/23 at  3:15 PM EDT by telephone and verified that I am speaking with the correct person using two identifiers. Nurse is located at [facility] and pt is located at home.  I explained I am completing New OB Intake today. We discussed her EDD of 09/23/2023 that is based on LMP of 12/17/2022. Pt is G3/P1011. I reviewed her allergies, medications, Medical/Surgical/OB history, and appropriate screenings. There is a cat outside the home. Based on history, this is a/an pregnancy uncomplicated .   Patient Active Problem List   Diagnosis Date Noted   Supervision of other normal pregnancy, antepartum 02/05/2023   First degree uterine prolapse 06/19/2022   Choledocholithiasis 09/17/2021   Hypokalemia 01/15/2021   Elevated transaminase level 01/15/2021    Concerns addressed today None  Delivery Plans:  Plans to deliver at [facility].  Anatomy US Explained first scheduled [place] will be soon scheduled soon and an anatomy scan will be done at 20 weeks.  Labs Discussed genetic screening with patient. Patient desires genetic testing to be drawn at new OB visit. Discussed possible labs to be drawn at new OB appointment.  COVID Vaccine Patient has not had COVID vaccine.   Social Determinants of Health Food Insecurity: denies food insecurity  Transportation: Patient denies transportation needs. Childcare: Discussed no children allowed at ultrasound appointments.   First visit review I reviewed new OB appt with pt. I explained she will have ob bloodwork and pap smear/pelvic exam if indicated. Explained pt will be seen by an AOB Provider at first visit; encounter routed to appropriate provider.   Robin Campbell, [place] 02/05/2023  3:52 PM

## 2023-02-08 NOTE — Progress Notes (Signed)
Contacted the patient via phone. She confirmed scheduling for 03/15/23 with Dr. Marice Potter!

## 2023-02-10 IMAGING — US US ABDOMEN LIMITED
1 series · 14 of 25 positions shown · non-contrast
Comparison: None.

CLINICAL DATA: Elevated LFTs

EXAM:
ULTRASOUND ABDOMEN LIMITED RIGHT UPPER QUADRANT

[Series 1: us abdomen limited ruq (liver/gb) · 14 of 55 slices shown]
[im 1/55]
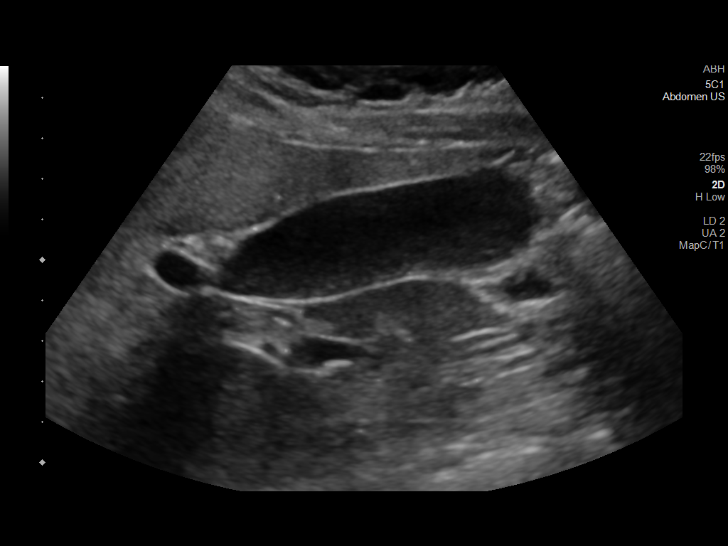
[im 5/55]
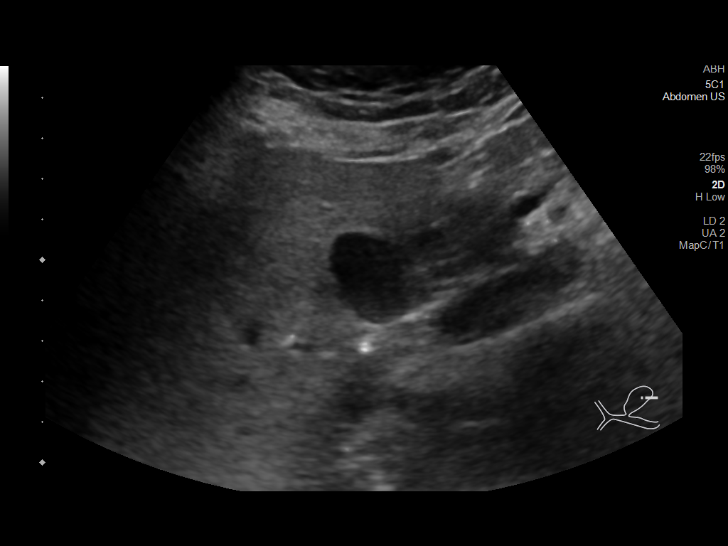
[im 10/55]
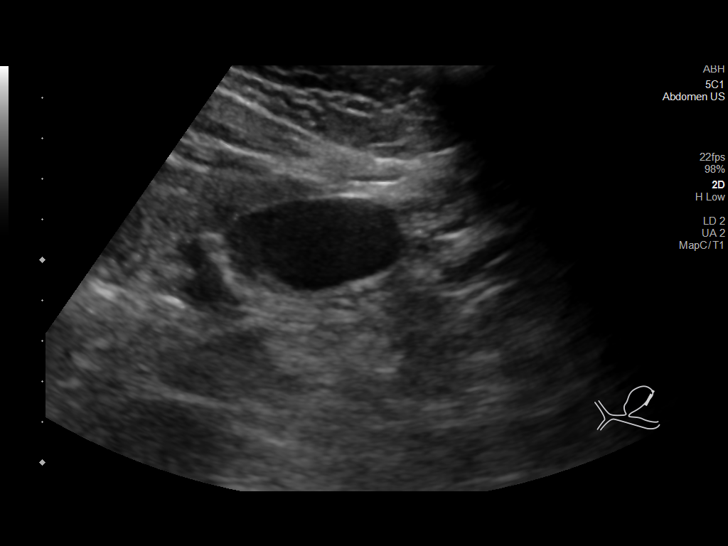
[im 14/55]
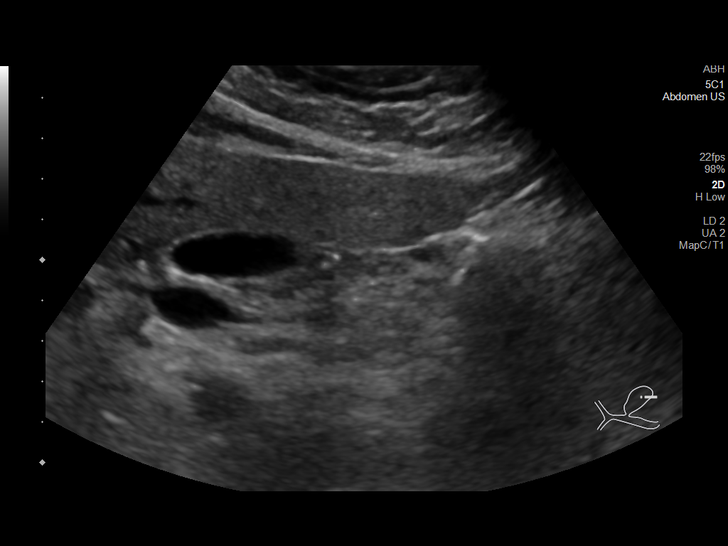
[im 19/55]
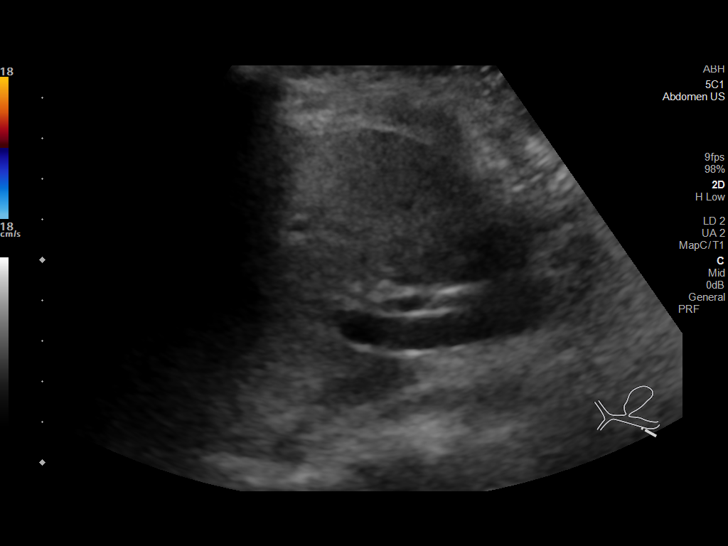
[im 21/55]
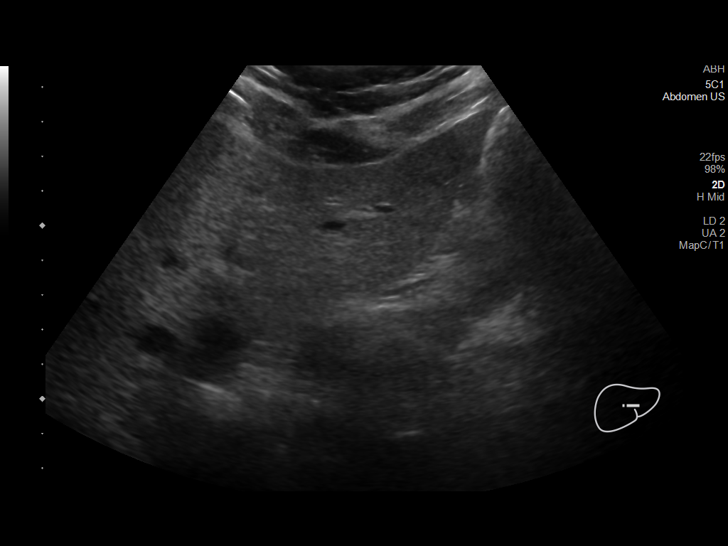
[im 25/55]
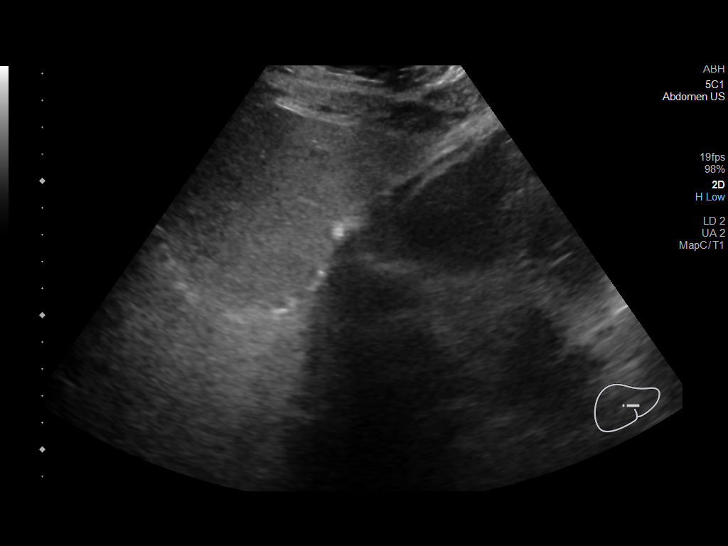
[im 30/55]
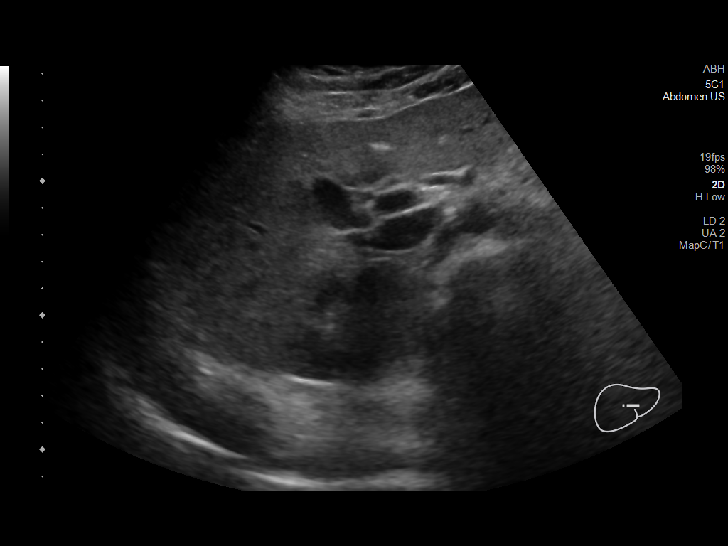
[im 34/55]
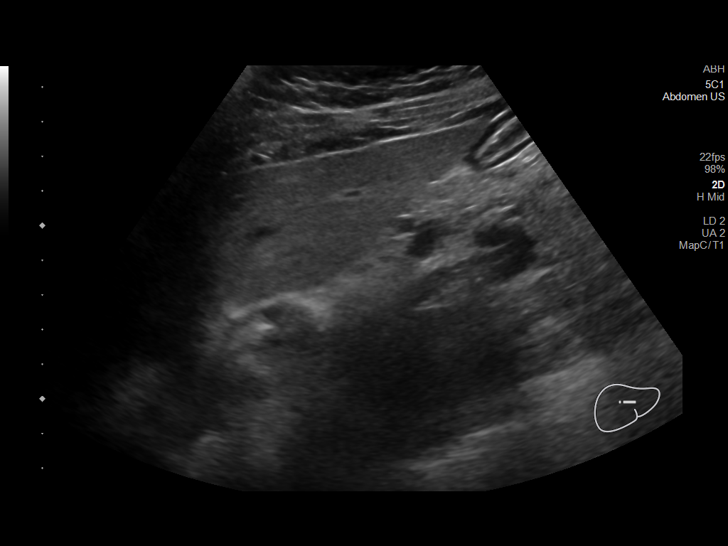
[im 37/55]
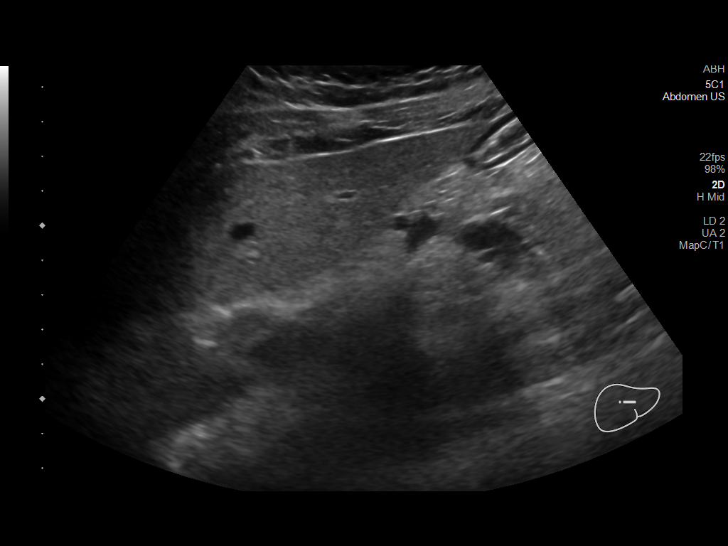
[im 41/55]
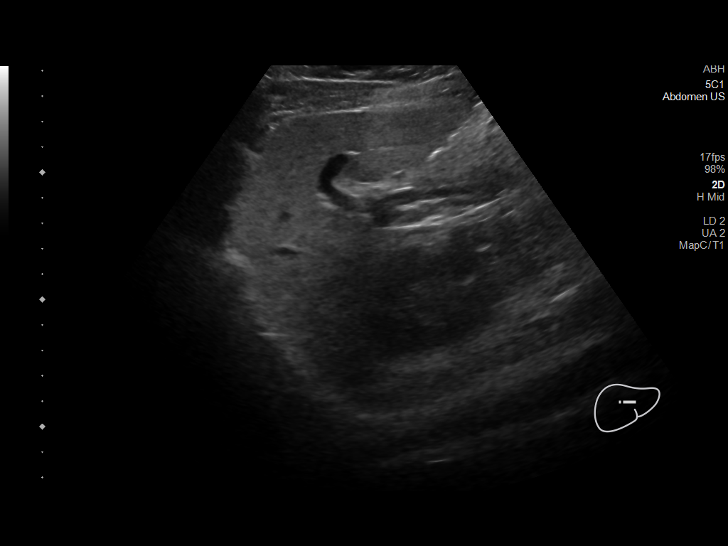
[im 46/55]
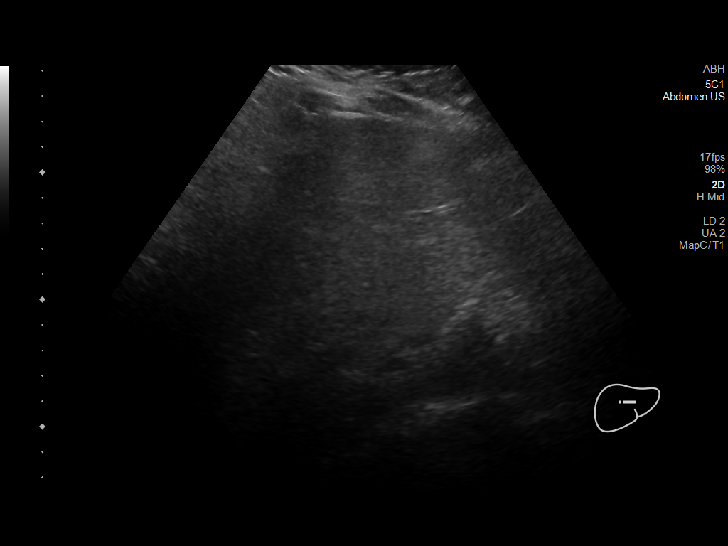
[im 50/55]
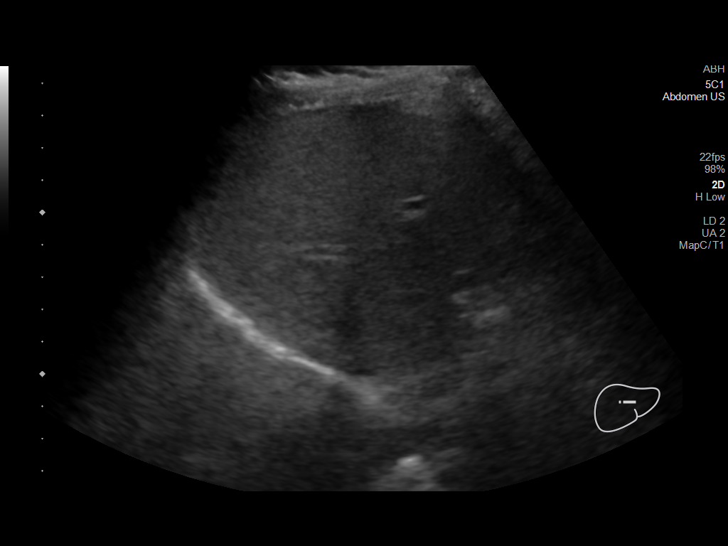
[im 55/55]
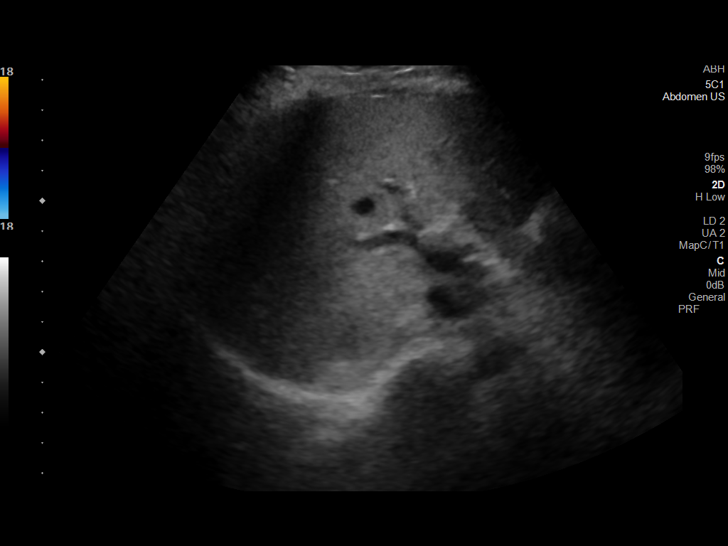

[14 of 25 positions shown; findings below may reference images not displayed]

FINDINGS: Gallbladder:

There is layering sludge in the gallbladder. No gallstones or wall
thickening visualized. No sonographic Murphy sign noted by
sonographer.

Common bile duct:

Diameter: 4 mm

Liver:

Parenchymal echogenicity is mildly increased. No focal lesion is
identified. There is no intrahepatic biliary ductal dilatation.
Portal vein is patent on color Doppler imaging with normal direction
of blood flow towards the liver.

Other: None.
IMPRESSION: 1. Mild hepatic steatosis.
2. Layering sludge in the gallbladder. No shadowing stones or
evidence of acute cholecystitis.

## 2023-02-24 ENCOUNTER — Ambulatory Visit
Admission: RE | Admit: 2023-02-24 | Discharge: 2023-02-24 | Disposition: A | Discharge status: Home / Self Care | Payer: Medicaid Other | Source: Ambulatory Visit | Attending: Advanced Practice Midwife | Admitting: Advanced Practice Midwife

## 2023-02-24 ENCOUNTER — Other Ambulatory Visit: Payer: Self-pay | Admitting: Advanced Practice Midwife

## 2023-02-24 DIAGNOSIS — Z3682 Encounter for antenatal screening for nuchal translucency: Secondary | ICD-10-CM | POA: Diagnosis not present

## 2023-02-24 DIAGNOSIS — Z3481 Encounter for supervision of other normal pregnancy, first trimester: Secondary | ICD-10-CM | POA: Diagnosis not present

## 2023-02-24 DIAGNOSIS — Z369 Encounter for antenatal screening, unspecified: Secondary | ICD-10-CM | POA: Diagnosis not present

## 2023-02-24 DIAGNOSIS — Z3A10 10 weeks gestation of pregnancy: Secondary | ICD-10-CM | POA: Diagnosis not present

## 2023-02-24 DIAGNOSIS — Z3480 Encounter for supervision of other normal pregnancy, unspecified trimester: Secondary | ICD-10-CM | POA: Insufficient documentation

## 2023-02-24 DIAGNOSIS — Z3687 Encounter for antenatal screening for uncertain dates: Secondary | ICD-10-CM | POA: Diagnosis not present

## 2023-03-05 DIAGNOSIS — Z419 Encounter for procedure for purposes other than remedying health state, unspecified: Secondary | ICD-10-CM | POA: Diagnosis not present

## 2023-03-15 ENCOUNTER — Ambulatory Visit (INDEPENDENT_AMBULATORY_CARE_PROVIDER_SITE_OTHER): Payer: Medicaid Other | Admitting: Obstetrics & Gynecology

## 2023-03-15 ENCOUNTER — Encounter: Payer: Self-pay | Admitting: Obstetrics & Gynecology

## 2023-03-15 ENCOUNTER — Other Ambulatory Visit (HOSPITAL_COMMUNITY)
Admission: RE | Admit: 2023-03-15 | Discharge: 2023-03-15 | Disposition: A | Discharge status: Home / Self Care | Payer: Medicaid Other | Source: Ambulatory Visit | Attending: Obstetrics & Gynecology | Admitting: Obstetrics & Gynecology

## 2023-03-15 VITALS — BP 106/63 | HR 94 | Wt 167.4 lb

## 2023-03-15 DIAGNOSIS — Z3A12 12 weeks gestation of pregnancy: Secondary | ICD-10-CM

## 2023-03-15 DIAGNOSIS — Z113 Encounter for screening for infections with a predominantly sexual mode of transmission: Secondary | ICD-10-CM | POA: Insufficient documentation

## 2023-03-15 DIAGNOSIS — Z3491 Encounter for supervision of normal pregnancy, unspecified, first trimester: Secondary | ICD-10-CM | POA: Diagnosis not present

## 2023-03-15 DIAGNOSIS — Z3480 Encounter for supervision of other normal pregnancy, unspecified trimester: Secondary | ICD-10-CM

## 2023-03-15 DIAGNOSIS — R32 Unspecified urinary incontinence: Secondary | ICD-10-CM

## 2023-03-15 DIAGNOSIS — Z3481 Encounter for supervision of other normal pregnancy, first trimester: Secondary | ICD-10-CM | POA: Diagnosis not present

## 2023-03-15 DIAGNOSIS — Z1379 Encounter for other screening for genetic and chromosomal anomalies: Secondary | ICD-10-CM

## 2023-03-15 DIAGNOSIS — Z124 Encounter for screening for malignant neoplasm of cervix: Secondary | ICD-10-CM

## 2023-03-15 NOTE — Addendum Note (Signed)
Addended by: Tommie Raymond on: 03/15/2023 10:10 AM   Modules accepted: Orders

## 2023-03-15 NOTE — Progress Notes (Signed)
OBSTETRIC INITIAL PRENATAL VISIT  Subjective:    Robin Campbell is being seen today for her first obstetrical visit.  This is a planned pregnancy. She is a 22 y.o. G73P1011 female at [redacted]w[redacted]d gestation, Estimated Date of Delivery: 09/23/23 with Patient's last menstrual period was 12/17/2022 (exact date).,  consistent with 10.2 week sono. Her obstetrical history is significant for  none . Relationship with FOB: significant other, living together. Patient does intend to breast feed. Pregnancy history fully reviewed.    OB History  Gravida Para Term Preterm AB Living  3 1 1  0 1 1  SAB IAB Ectopic Multiple Live Births  1 0 0 0 1    # Outcome Date GA Lbr Len/2nd Weight Sex Type Anes PTL Lv  3 Current           2 SAB 09/2022     SAB     1 Term 07/27/21 [redacted]w[redacted]d  7 lb 1 oz (3.204 kg) M    LIV    Gynecologic History:  Last pap smear was : never done.  denies history of STIs.  Contraception prior to conception:    Past Medical History:  Diagnosis Date   Encounter for care or examination of lactating mother 07/27/2021   Medical history non-contributory     Family History  Problem Relation Age of Onset   Other Mother        stargart - all her life   Colon cancer Father        69s   Throat cancer Father    Healthy Sister    Healthy Sister    Healthy Sister    Healthy Sister    Healthy Sister    Healthy Brother    Healthy Brother    Healthy Brother    Healthy Brother    Healthy Brother    Healthy Maternal Grandmother    Diabetes Maternal Grandfather     Past Surgical History:  Procedure Laterality Date   CHOLECYSTECTOMY  2023   ERCP N/A 09/18/2021   Procedure: ENDOSCOPIC RETROGRADE CHOLANGIOPANCREATOGRAPHY (ERCP);  Surgeon: Midge Minium, MD;  Location: [facility] ENDOSCOPY;  Service: Endoscopy;  Laterality: N/A;    Social History   Socioeconomic History   Marital status: Single    Spouse name: Not on file   Number of children: 1   Years of education: 12   Highest  education level: Not on file  Occupational History   Occupation: unemployed  Tobacco Use   Smoking status: Never    Passive exposure: Never   Smokeless tobacco: Never  Vaping Use   Vaping status: Never Used  Substance and Sexual Activity   Alcohol use: Not Currently   Drug use: Never   Sexual activity: Yes    Partners: Male    Birth control/protection: None  Other Topics Concern   Not on file  Social History Narrative   Not on file   Social Determinants of Health   Financial Resource Strain: Low Risk  (02/05/2023)   Overall Financial Resource Strain (CARDIA)    Difficulty of Paying Living Expenses: Not hard at all  Food Insecurity: No Food Insecurity (02/05/2023)   Hunger Vital Sign    Worried About Running Out of Food in the Last Year: Never true    Ran Out of Food in the Last Year: Never true  Transportation Needs: No Transportation Needs (02/05/2023)   PRAPARE - Administrator, Civil Service (Medical): No    Lack of Transportation (Non-Medical): No  Physical Activity: Inactive (02/05/2023)   Exercise Vital Sign    Days of Exercise per Week: 0 days    Minutes of Exercise per Session: 0 min  Stress: No Stress Concern Present (02/05/2023)   [employer] of Occupational Health - Occupational Stress Questionnaire    Feeling of Stress : Not at all  Social Connections: Moderately Isolated (02/05/2023)   Social Connection and Isolation Panel [NHANES]    Frequency of Communication with Friends and Family: More than three times a week    Frequency of Social Gatherings with Friends and Family: Three times a week    Attends Religious Services: Never    Active Member of Clubs or Organizations: No    Attends Banker Meetings: Never    Marital Status: Living with partner  Intimate Partner Violence: Not At Risk (02/05/2023)   Humiliation, Afraid, Rape, and Kick questionnaire    Fear of Current or Ex-Partner: No    Emotionally Abused: No    Physically  Abused: No    Sexually Abused: No    Current Outpatient Medications on File Prior to Visit  Medication Sig Dispense Refill   prenatal vitamin w/FE, FA (NATACHEW) 29-1 MG CHEW chewable tablet Chew 2 tablets by mouth daily at 12 noon.     No current facility-administered medications on file prior to visit.    No Known Allergies   Review of Systems General: Not Present- Fever, Weight Loss and Weight Gain. Skin: Not Present- Rash. HEENT: Not Present- Blurred Vision, Headache and Bleeding Gums. Respiratory: Not Present- Difficulty Breathing. Breast: Not Present- Breast Mass. Cardiovascular: Not Present- Chest Pain, Elevated Blood Pressure, Fainting / Blacking Out and Shortness of Breath. Gastrointestinal: Not Present- Abdominal Pain, Constipation, Nausea and Vomiting. Female Genitourinary: Not Present- Frequency, Painful Urination, Pelvic Pain, Vaginal Bleeding, Vaginal Discharge, Contractions, regular, Fetal Movements Decreased, Urinary Complaints and Vaginal Fluid. Musculoskeletal: Not Present- Back Pain and Leg Cramps. Neurological: Not Present- Dizziness. Psychiatric: Not Present- Depression.     Objective:   Last menstrual period 12/17/2022, unknown if currently breastfeeding.  There is no height or weight on file to calculate BMI.  General Appearance:    Alert, cooperative, no distress, appears stated age  Head:    Normocephalic, without obvious abnormality, atraumatic  Eyes:    PERRL, conjunctiva/corneas clear, EOM's intact, both eyes  Ears:    Normal external ear canals, both ears  Nose:   Nares normal, septum midline, mucosa normal, no drainage or sinus tenderness  Throat:   Lips, mucosa, and tongue normal; teeth and gums normal  Neck:   Supple, symmetrical, trachea midline, no adenopathy; thyroid: no enlargement/tenderness/nodules; no carotid bruit or JVD  Back:     Symmetric, no curvature, ROM normal, no CVA tenderness  Lungs:     Clear to auscultation bilaterally,  respirations unlabored  Chest Wall:    No tenderness or deformity   Heart:    Regular rate and rhythm, S1 and S2 normal, no murmur, rub or gallop  Breast Exam:    No tenderness, masses, or nipple abnormality  Abdomen:     Soft, non-tender, bowel sounds active all four quadrants, no masses, no organomegaly.  FHT 165  bpm.  Genitalia:    Pelvic:external genitalia normal, vagina without lesions, discharge, or tenderness, rectovaginal septum  normal. Cervix normal in appearance, no cervical motion tenderness, no adnexal masses or tenderness.  Pregnancy positive findings: uterine enlargement: 13 wk size, nontender.   Rectal:    No hemorrhoids appreciated. Internal exam not  done.   Extremities:   Extremities normal, atraumatic, no cyanosis or edema  Pulses:   2+ and symmetric all extremities  Skin:   Skin color, texture, turgor normal, no rashes or lesions  Lymph nodes:   Cervical, supraclavicular, and axillary nodes normal  Neurologic:   CNII-XII intact, normal strength, sensation and reflexes throughout     Assessment:   1. Initial obstetric visit in first trimester   2. Supervision of other normal pregnancy, antepartum     Plan:   Supervision of normal pregnancy  - Initial labs reviewed. - Prenatal vitamins encouraged. - Problem list reviewed and updated. - New OB counseling:  The patient has been given an overview regarding routine prenatal care.  Recommendations regarding diet, weight gain, and exercise in pregnancy were given. - Prenatal testing, optional genetic testing, and ultrasound use in pregnancy were reviewed.  Traditional genetic screening vs cell-fee DNA genetic screening discussed, including risks and benefits. Testing ordered. - Benefits of Breast Feeding were discussed. The patient is encouraged to consider nursing her baby post partum.  1. Initial obstetric visit in first trimester - rec around 20 pound weight gain during pregnancy  2. Supervision of other normal  pregnancy, antepartum - She declines flu vaccine as of today.   Follow up in 4 weeks.   Nicholaus Bloom, MD [facility]

## 2023-03-16 LAB — URINALYSIS, ROUTINE W REFLEX MICROSCOPIC
Bilirubin, UA: NEGATIVE
Glucose, UA: NEGATIVE
Ketones, UA: NEGATIVE
Leukocytes,UA: NEGATIVE
Nitrite, UA: NEGATIVE
RBC, UA: NEGATIVE
Specific Gravity, UA: 1.023 (ref 1.005–1.030)
Urobilinogen, Ur: 0.2 mg/dL (ref 0.2–1.0)
pH, UA: 7.5 (ref 5.0–7.5)

## 2023-03-16 LAB — MONITOR DRUG PROFILE 14(MW)
Amphetamine Scrn, Ur: NEGATIVE ng/mL
BARBITURATE SCREEN URINE: NEGATIVE ng/mL
BENZODIAZEPINE SCREEN, URINE: NEGATIVE ng/mL
Buprenorphine, Urine: NEGATIVE ng/mL
CANNABINOIDS UR QL SCN: NEGATIVE ng/mL
Cocaine (Metab) Scrn, Ur: NEGATIVE ng/mL
Creatinine(Crt), U: 179.2 mg/dL (ref 20.0–300.0)
Fentanyl, Urine: NEGATIVE pg/mL
Meperidine Screen, Urine: NEGATIVE ng/mL
Methadone Screen, Urine: NEGATIVE ng/mL
OXYCODONE+OXYMORPHONE UR QL SCN: NEGATIVE ng/mL
Opiate Scrn, Ur: NEGATIVE ng/mL
Ph of Urine: 7.1 (ref 4.5–8.9)
Phencyclidine Qn, Ur: NEGATIVE ng/mL
Propoxyphene Scrn, Ur: NEGATIVE ng/mL
SPECIFIC GRAVITY: 1.019
Tramadol Screen, Urine: NEGATIVE ng/mL

## 2023-03-17 LAB — CYTOLOGY - PAP
Chlamydia: NEGATIVE
Comment: NEGATIVE
Comment: NORMAL
Diagnosis: NEGATIVE
Neisseria Gonorrhea: NEGATIVE

## 2023-03-17 LAB — CBC/D/PLT+RPR+RH+ABO+RUBIGG...
Antibody Screen: NEGATIVE
Basophils Absolute: 0 10*3/uL (ref 0.0–0.2)
Basos: 0 %
EOS (ABSOLUTE): 0.2 10*3/uL (ref 0.0–0.4)
Eos: 2 %
HCV Ab: NONREACTIVE
HIV Screen 4th Generation wRfx: NONREACTIVE
Hematocrit: 39.3 % (ref 34.0–46.6)
Hemoglobin: 12.7 g/dL (ref 11.1–15.9)
Hepatitis B Surface Ag: NEGATIVE
Immature Grans (Abs): 0 10*3/uL (ref 0.0–0.1)
Immature Granulocytes: 0 %
Lymphocytes Absolute: 2.1 10*3/uL (ref 0.7–3.1)
Lymphs: 24 %
MCH: 29.4 pg (ref 26.6–33.0)
MCHC: 32.3 g/dL (ref 31.5–35.7)
MCV: 91 fL (ref 79–97)
Monocytes Absolute: 0.6 10*3/uL (ref 0.1–0.9)
Monocytes: 6 %
Neutrophils Absolute: 5.9 10*3/uL (ref 1.4–7.0)
Neutrophils: 68 %
Platelets: 291 10*3/uL (ref 150–450)
RBC: 4.32 x10E6/uL (ref 3.77–5.28)
RDW: 12.8 % (ref 11.7–15.4)
RPR Ser Ql: NONREACTIVE
Rh Factor: POSITIVE
Rubella Antibodies, IGG: 2.89 {index} (ref >0.99)
Varicella zoster IgG: REACTIVE
WBC: 8.8 10*3/uL (ref 3.4–10.8)

## 2023-03-17 LAB — HGB FRACTIONATION CASCADE
Hgb A2: 2.7 % (ref 1.8–3.2)
Hgb A: 97.3 % (ref 96.4–98.8)
Hgb F: 0 % (ref 0.0–2.0)
Hgb S: 0 %

## 2023-03-17 LAB — HCV INTERPRETATION

## 2023-03-19 LAB — MATERNIT 21 PLUS CORE, BLOOD
Fetal Fraction: 12
Result (T21): NEGATIVE
Trisomy 13 (Patau syndrome): NEGATIVE
Trisomy 18 (Edwards syndrome): NEGATIVE
Trisomy 21 (Down syndrome): NEGATIVE

## 2023-03-19 LAB — URINE CULTURE, OB REFLEX

## 2023-03-19 LAB — CULTURE, OB URINE

## 2023-03-22 ENCOUNTER — Other Ambulatory Visit: Payer: Self-pay | Admitting: Obstetrics & Gynecology

## 2023-03-22 ENCOUNTER — Encounter: Payer: Self-pay | Admitting: Obstetrics & Gynecology

## 2023-03-22 DIAGNOSIS — B951 Streptococcus, group B, as the cause of diseases classified elsewhere: Secondary | ICD-10-CM | POA: Insufficient documentation

## 2023-03-22 MED ORDER — AMPICILLIN 500 MG PO CAPS: 500.0000 mg | ORAL_CAPSULE | Freq: Four times a day (QID) | ORAL | 0 refills | Status: DC

## 2023-03-22 NOTE — Progress Notes (Signed)
Amp prescribed to treat GBS UTI. Message sent to patient.

## 2023-03-24 LAB — OB RESULTS CONSOLE GBS: GBS: POSITIVE

## 2023-04-04 DIAGNOSIS — Z419 Encounter for procedure for purposes other than remedying health state, unspecified: Secondary | ICD-10-CM | POA: Diagnosis not present

## 2023-04-12 ENCOUNTER — Ambulatory Visit (INDEPENDENT_AMBULATORY_CARE_PROVIDER_SITE_OTHER): Payer: Medicaid Other | Admitting: Obstetrics

## 2023-04-12 VITALS — BP 99/59 | HR 50 | Wt 171.3 lb

## 2023-04-12 DIAGNOSIS — Z3480 Encounter for supervision of other normal pregnancy, unspecified trimester: Secondary | ICD-10-CM

## 2023-04-12 DIAGNOSIS — Z3A16 16 weeks gestation of pregnancy: Secondary | ICD-10-CM

## 2023-04-12 DIAGNOSIS — Z3482 Encounter for supervision of other normal pregnancy, second trimester: Secondary | ICD-10-CM

## 2023-04-12 LAB — POCT URINALYSIS DIPSTICK OB
Bilirubin, UA: NEGATIVE
Blood, UA: NEGATIVE
Glucose, UA: NEGATIVE
Ketones, UA: NEGATIVE
Leukocytes, UA: NEGATIVE
Nitrite, UA: NEGATIVE
POC,PROTEIN,UA: NEGATIVE
Spec Grav, UA: 1.015 (ref 1.010–1.025)
Urobilinogen, UA: 0.2 U/dL
pH, UA: 7 (ref 5.0–8.0)

## 2023-04-12 NOTE — Progress Notes (Signed)
Routine Prenatal Care Visit  Subjective  Robin Campbell is a 22 y.o. G3P1011 at [redacted]w[redacted]d being seen today for ongoing prenatal care.  She is currently monitored for the following issues for this low-risk pregnancy and has Hypokalemia; Elevated transaminase level; Choledocholithiasis; First degree uterine prolapse; Supervision of other normal pregnancy, antepartum; and Group B Streptococcus urinary tract infection affecting pregnancy, antepartum on their problem list.  ----------------------------------------------------------------------------------- Patient reports no complaints.   Contractions: Not present. Vag. Bleeding: None.  Movement: Absent. Leaking Fluid denies.  ----------------------------------------------------------------------------------- The following portions of the patient's history were reviewed and updated as appropriate: allergies, current medications, past family history, past medical history, past social history, past surgical history and problem list. Problem list updated.  Objective  Blood pressure (!) 99/59, pulse (!) 50, weight 171 lb 4.8 oz (77.7 kg), last menstrual period 12/17/2022, unknown if currently breastfeeding. Pregravid weight 170 lb (77.1 kg) Total Weight Gain 1 lb 4.8 oz (0.59 kg) Urinalysis: Urine Protein Negative  Urine Glucose Negative  Fetal Status: Fetal Heart Rate (bpm): 153   Movement: Absent     General:  Alert, oriented and cooperative. Patient is in no acute distress.  Skin: Skin is warm and dry. No rash noted.   Cardiovascular: Normal heart rate noted  Respiratory: Normal respiratory effort, no problems with respiration noted  Abdomen: Soft, gravid, appropriate for gestational age. Pain/Pressure: Absent     Pelvic:  Cervical exam deferred        Extremities: Normal range of motion.  Edema: None  Mental Status: Normal mood and affect. Normal behavior. Normal judgment and thought content.   Assessment   22 y.o. G3P1011 at [redacted]w[redacted]d by   09/23/2023, by Last Menstrual Period presenting for routine prenatal visit  Plan   three Problems (from 02/05/23 to present)     Problem Noted Resolved   Supervision of other normal pregnancy, antepartum 02/05/2023 by Loran Senters, CMA No   Overview Addendum 04/12/2023  8:49 AM by Tommie Raymond, CMA     Clinical Staff Provider  Office Location   [facility] Dating  09/23/2023 by LMP  Language  English Anatomy US    Flu Vaccine  offer Genetic Screen  NIPS: Neg/Female  TDaP vaccine   offer Hgb A1C or  GTT Early : Third trimester :   Covid declined   LAB RESULTS   Rhogam     Blood Type   O+ 07/27/21  RSV offer Antibody  Neg  07/27/21  Feeding Plan breast Rubella  Immune 01/08/2021  Contraception undecided RPR   Non reactive 10/18/2021  Circumcision yes HBsAg   Non reactive 01/08/2021  Pediatrician  [facility] HIV  Non reactive  10/18/2021  Support Person [place] Varicella  [id number]  01/08/21  Prenatal Classes no GBS  (For PCN allergy, check sensitivities)     Hep C   <0.1  01/08/2021  BTL Consent  Pap No results found for: "DIAGPAP"  VBAC Consent  Hgb Electro      CF      SMA     GC/CHLM Neg/neg  06/23/2021              Preterm labor symptoms and general obstetric precautions including but not limited to vaginal bleeding, contractions, leaking of fluid and fetal movement were reviewed in detail with the patient. Please refer to After Visit Summary for other counseling recommendations.   Return in about 4 weeks (around 05/10/2023) for return OB, anatomy scan.  Mirna Mires, CNM  04/12/2023 11:12 AM

## 2023-05-03 ENCOUNTER — Ambulatory Visit: Payer: Medicaid Other

## 2023-05-03 DIAGNOSIS — Z3A20 20 weeks gestation of pregnancy: Secondary | ICD-10-CM | POA: Diagnosis not present

## 2023-05-03 DIAGNOSIS — Z3482 Encounter for supervision of other normal pregnancy, second trimester: Secondary | ICD-10-CM

## 2023-05-03 DIAGNOSIS — Z3480 Encounter for supervision of other normal pregnancy, unspecified trimester: Secondary | ICD-10-CM

## 2023-05-05 DIAGNOSIS — Z419 Encounter for procedure for purposes other than remedying health state, unspecified: Secondary | ICD-10-CM | POA: Diagnosis not present

## 2023-05-05 NOTE — L&D Delivery Note (Addendum)
 Delivery Note   Robin Campbell is a 23 y.o. G3P1011 at [redacted]w[redacted]d Estimated Date of Delivery: 09/23/23  PRE-OPERATIVE DIAGNOSIS:  1) [redacted]w[redacted]d pregnancy.  2) Intrahepatic cholestasis of pregnancy  POST-OPERATIVE DIAGNOSIS:  1) [redacted]w[redacted]d pregnancy s/p Vaginal, Spontaneous   Delivery Type: Vaginal, Spontaneous   Delivery Anesthesia: Epidural  Labor Complications: None    ESTIMATED BLOOD LOSS: 150 ml    FINDINGS:   1) female infant, "Grayson." Apgar scores of 9   at 1 minute and 9   at 5 minutes; birthweight 7 lbs., 1.6 oz.   SPECIMENS:   PLACENTA:   Appearance: Intact   Removal: Spontaneous     Disposition: Per protocol  CORD BLOOD: Collected  DISPOSITION:  Infant left in stable condition in the delivery room, with L&D personnel and mother.  NARRATIVE SUMMARY: Labor course:  [name] is a G3P1011 at [redacted]w[redacted]d who presented to Labor & Delivery for induction of labor for ICP. Her initial cervical exam was FT/50/-3. Labor proceeded with augmentation, and she was found to be completely dilated at 1520. With excellent maternal pushing effort, she birthed a viable female infant OA to LOT at 33. There was not a nuchal cord. The shoulders were birthed without difficulty. The infant was placed skin-to-skin with Morganna. The cord was doubly clamped and cut by the father when pulsations ceased. The placenta delivered spontaneously and was noted to be intact with a 3VC. A perineal and vaginal examination was performed. Episiotomy/Lacerations: 1st degree, not repaired (hemostatic)  Marquasia tolerated this well. Mother and baby were left in stable condition.   Josue Nip, CNM 09/07/2023 3:48 PM

## 2023-05-10 ENCOUNTER — Encounter: Payer: Medicaid Other | Admitting: Advanced Practice Midwife

## 2023-05-13 ENCOUNTER — Encounter: Payer: Self-pay | Admitting: Advanced Practice Midwife

## 2023-05-13 ENCOUNTER — Ambulatory Visit (INDEPENDENT_AMBULATORY_CARE_PROVIDER_SITE_OTHER): Payer: Medicaid Other | Admitting: Advanced Practice Midwife

## 2023-05-13 VITALS — BP 131/71 | HR 88 | Wt 174.7 lb

## 2023-05-13 DIAGNOSIS — Z3482 Encounter for supervision of other normal pregnancy, second trimester: Secondary | ICD-10-CM

## 2023-05-13 DIAGNOSIS — Z3A21 21 weeks gestation of pregnancy: Secondary | ICD-10-CM

## 2023-05-13 NOTE — Progress Notes (Signed)
 Routine Prenatal Care Visit  Subjective  Robin Campbell is a 23 y.o. G3P1011 at [redacted]w[redacted]d being seen today for ongoing prenatal care.  She is currently monitored for the following issues for this low-risk pregnancy and has Hypokalemia; Elevated transaminase level; Choledocholithiasis; First degree uterine prolapse; Supervision of other normal pregnancy, antepartum; and Group B Streptococcus urinary tract infection affecting pregnancy, antepartum on their problem list.  ----------------------------------------------------------------------------------- Patient reports she is doing well. Reviewed results of complete/normal anatomy scan.   Contractions: Not present. Vag. Bleeding: None.  Movement: Present. Leaking Fluid denies.  ----------------------------------------------------------------------------------- The following portions of the patient's history were reviewed and updated as appropriate: allergies, current medications, past family history, past medical history, past social history, past surgical history and problem list. Problem list updated.  Objective  Blood pressure 131/71, pulse 88, weight 174 lb 11.2 oz (79.2 kg), last menstrual period 12/17/2022, unknown if currently breastfeeding. Pregravid weight 170 lb (77.1 kg) Total Weight Gain 4 lb 11.2 oz (2.132 kg) Urinalysis: Urine Protein    Urine Glucose    Fetal Status: Fetal Heart Rate (bpm): 150   Movement: Present     General:  Alert, oriented and cooperative. Patient is in no acute distress.  Skin: Skin is warm and dry. No rash noted.   Cardiovascular: Normal heart rate noted  Respiratory: Normal respiratory effort, no problems with respiration noted  Abdomen: Soft, gravid, appropriate for gestational age. Pain/Pressure: Absent     Pelvic:  Cervical exam deferred        Extremities: Normal range of motion.  Edema: None  Mental Status: Normal mood and affect. Normal behavior. Normal judgment and thought content.   Assessment    23 y.o. G3P1011 at [redacted]w[redacted]d by  09/23/2023, by Last Menstrual Period presenting for routine prenatal visit  Plan   three Problems (from 02/05/23 to present)     Problem Noted Diagnosed Resolved   Supervision of other normal pregnancy, antepartum 02/05/2023 by Vicci Levan, CMA  No   Overview Addendum 05/13/2023  9:11 AM by Lynda Bradley, CNM   Clinical Staff Provider  Office Location  [facility] Dating  09/23/2023 by LMP  Language  English Anatomy US   Complete, female, placenta posterior  Flu Vaccine  declined Genetic Screen  NIPS: Neg/Female  TDaP vaccine   offer Hgb A1C or  GTT Early : Third trimester :   Covid declined   LAB RESULTS   Rhogam     Blood Type   O+ 07/27/21  RSV offer Antibody  Neg  07/27/21  Feeding Plan breast Rubella  Immune 01/08/2021  Contraception undecided RPR   Non reactive 10/18/2021  Circumcision yes HBsAg   Non reactive 01/08/2021  Pediatrician  [facility] HIV  Non reactive  10/18/2021  Support Person [place] Varicella  [phone]  01/08/21  Prenatal Classes no GBS  (For PCN allergy, check sensitivities)     Hep C   <0.1  01/08/2021  BTL Consent  Pap No results found for: DIAGPAP  VBAC Consent  Hgb Electro      CF      SMA     GC/CHLM Neg/neg  06/23/2021              Preterm labor symptoms and general obstetric precautions including but not limited to vaginal bleeding, contractions, leaking of fluid and fetal movement were reviewed in detail with the patient. Please refer to After Visit Summary for other counseling recommendations.   Return in about 4 weeks (around 06/10/2023) for rob.  Bradley  Lynda, CNM 05/13/2023 9:17 AM

## 2023-06-05 DIAGNOSIS — Z419 Encounter for procedure for purposes other than remedying health state, unspecified: Secondary | ICD-10-CM | POA: Diagnosis not present

## 2023-06-10 ENCOUNTER — Encounter: Payer: Self-pay | Admitting: Certified Nurse Midwife

## 2023-06-10 ENCOUNTER — Ambulatory Visit: Payer: Medicaid Other | Admitting: Certified Nurse Midwife

## 2023-06-10 VITALS — BP 110/77 | HR 90 | Wt 179.0 lb

## 2023-06-10 DIAGNOSIS — K8050 Calculus of bile duct without cholangitis or cholecystitis without obstruction: Secondary | ICD-10-CM

## 2023-06-10 DIAGNOSIS — Z3A25 25 weeks gestation of pregnancy: Secondary | ICD-10-CM

## 2023-06-10 DIAGNOSIS — Z3480 Encounter for supervision of other normal pregnancy, unspecified trimester: Secondary | ICD-10-CM

## 2023-06-10 DIAGNOSIS — N812 Incomplete uterovaginal prolapse: Secondary | ICD-10-CM

## 2023-06-10 DIAGNOSIS — B951 Streptococcus, group B, as the cause of diseases classified elsewhere: Secondary | ICD-10-CM

## 2023-06-10 DIAGNOSIS — Z131 Encounter for screening for diabetes mellitus: Secondary | ICD-10-CM

## 2023-06-10 DIAGNOSIS — Z113 Encounter for screening for infections with a predominantly sexual mode of transmission: Secondary | ICD-10-CM

## 2023-06-10 DIAGNOSIS — D649 Anemia, unspecified: Secondary | ICD-10-CM

## 2023-06-10 DIAGNOSIS — Z3482 Encounter for supervision of other normal pregnancy, second trimester: Secondary | ICD-10-CM | POA: Diagnosis not present

## 2023-06-10 DIAGNOSIS — E876 Hypokalemia: Secondary | ICD-10-CM

## 2023-06-10 DIAGNOSIS — R7401 Elevation of levels of liver transaminase levels: Secondary | ICD-10-CM

## 2023-06-10 NOTE — Progress Notes (Signed)
    Return Prenatal Note   Subjective   23 y.o. G3P1011 at [redacted]w[redacted]d presents for this follow-up prenatal visit.  Patient feeling well, active baby. Considering BTL postpartum when having surgery to repair uterine prolapse. Patient reports: Movement: Present Contractions: Irritability  Objective   Flow sheet Vitals: Pulse Rate: 90 BP: 110/77 Fundal Height: 25 cm Fetal Heart Rate (bpm): 145 Total weight gain: 9 lb (4.082 kg)  General Appearance  No acute distress, well appearing, and well nourished Pulmonary   Normal work of breathing Neurologic   Alert and oriented to person, place, and time Psychiatric   Mood and affect within normal limits  Assessment/Plan   Plan  23 y.o. G3P1011 at [redacted]w[redacted]d presents for follow-up OB visit. Reviewed prenatal record including previous visit note.  Supervision of other normal pregnancy, antepartum Reviewed kick counts and preterm labor warning signs. Instructed to call office or come to hospital with persistent headache, vision changes, regular contractions, leaking of fluid, decreased fetal movement or vaginal bleeding. Prepared for 28 week labs, TDAP at next visit.      Orders Placed This Encounter  Procedures   28 Week RH+Panel    Standing Status:   Future    Expected Date:   06/24/2023    Expiration Date:   06/09/2024   Return in about 4 weeks (around 07/08/2023) for ROB & GDM screening.   Future Appointments  Date Time Provider Department Center  07/08/2023  8:20 AM [facility] None  07/08/2023  8:55 AM Lynda Bradley, CNM [facility] None    For next visit:  ROB with 1 hour glucola, third trimester labs, and Tdap     Harlene LITTIE Cisco, CNM  02/06/259:17 AM

## 2023-06-10 NOTE — Patient Instructions (Signed)
 Gestational Diabetes Mellitus, Diagnosis Gestational diabetes mellitus, or gestational diabetes, is diabetes that some people get when pregnant. This condition usually happens at 24-28 weeks of pregnancy.  People with gestational diabetes have high blood sugar. If you do not get treated for this condition, it may cause problems for you and your baby. It goes away after you give birth but can happen again the next time you become pregnant. What are the causes? This condition is caused by changes in your body when you're pregnant. When these changes happen: The pancreas may not make enough insulin. The body may not use insulin in the right way. This is called insulin resistance. Insulin helps your body use sugar for energy. If your body doesn't have enough insulin or can't use the insulin that it has, extra sugars stay in your blood. This leads to high blood sugar (hyperglycemia). What increases the risk? Being older than age 36 when pregnant. Too much body weight. Having polycystic ovary syndrome (PCOS). Having someone with diabetes in your family. Having had this condition in the past. Being pregnant with more than one baby. What are the signs or symptoms? You may not have any symptoms. If you do have symptoms, they may include: Being thirsty often. Being hungry often. Needing to pee more often. These symptoms can be missed because they're similar to other symptoms of pregnancy. How is this diagnosed? This condition may be diagnosed based on your blood sugar level and how your body responds to glucose. This may be checked with an oral glucose tolerance test (OGTT). The test may be done: Early in pregnancy if you have risk factors. At 24-28 weeks of your pregnancy. How is this treated? To treat this condition, you may be told to: Eat a healthy diet. Get more exercise. Check your blood sugar often. Your health care provider will tell you what your target is. Take insulin and other  medicines. These are taken if needed. Work with a diabetes expert. Follow these instructions at home: Learn about your diabetes Ask your provider: How often should I check my blood sugar? Where do I get the equipment? What medicines do I need? When should I take them? Do I need to meet with an educator? Who can I call if I have questions? Where can I find a support group? General instructions Take medicines only as told by your provider. Stay at a healthy weight. Drink enough fluid to keep your pee (urine) pale yellow. Check your pee for ketones when sick and as told. Ketones in your pee is a sign that your body is using fat for energy because it's not making enough insulin. Wear an alert bracelet or carry a card that shows you have this condition. Keep all follow-up visits. Your provider needs to check your health and your baby's growth. Where to find more information Gestational Diabetes: American Diabetes Association (ADA): diabetes.org Gestational Diabetes and Pregnancy: Centers for Disease Control and Prevention (CDC): TonerPromos.no American Pregnancy Association: americanpregnancy.org [place] MyPlate: WrestlingReporter.dk Contact a health care provider if:  Your blood sugar is above your target for two tests in a row. You have a high blood sugar level and you also have ketones in your pee. You have been sick or have had a fever for 2 days or more and aren't getting better. You have any of these problems for more than 6 hours: You can't eat or drink. You vomit or feel like you may vomit. You have watery poop (diarrhea). Get help right away if:  You become confused or cannot think clearly. You have trouble breathing. You have chest pain. Your baby is moving less than usual. You have unusual discharge or bleeding from your vagina. You have cramping in your belly or have pain in your hips or lower back. You have symptoms of high blood pressure or preeclampsia. These include: A severe,  throbbing headache that doesn't go away. Sudden or extreme swelling of your face, hands, legs, or feet. Vision problems, such as: Seeing spots. Blurry vision. Sensitivity to light. These symptoms may be an emergency. Get help right away. Call 911. Do not wait to see if the symptoms will go away. Do not drive yourself to the hospital. This information is not intended to replace advice given to you by your health care provider. Make sure you discuss any questions you have with your health care provider. Document Revised: 01/21/2023 Document Reviewed: 07/31/2022 Elsevier Patient Education  2024 [employer] Second Trimester of Pregnancy  The second trimester of pregnancy is from week 14 through week 27. This is months 4 through 6 of pregnancy. During the second trimester: Morning sickness is less or has stopped. You may have more energy. You may feel hungry more often. At this time, your unborn baby is growing very fast. At the end of the sixth month, the unborn baby may be up to 12 inches long and weigh about 1 pounds. You will likely start to feel the baby move between 16 and 20 weeks of pregnancy. Body changes during your second trimester Your body continues to change during this time. The changes usually go away after your baby is born. Physical changes You will gain more weight. Your belly will get bigger. You may begin to get stretch marks on your hips, belly, and breasts. Your breasts will keep growing and may hurt. You may get dark spots or blotches on your face. A dark line from your belly button to the pubic area may appear. This line is called linea nigra. Your hair may grow faster and get thicker. Health changes You may have headaches. You may have heartburn. You may pee more often. You may have swollen, bulging veins (varicose veins). You may have trouble pooping (constipation), or swollen veins in the butt that can itch or get painful (hemorrhoids). You may have back  pain. This is caused by: Weight gain. Pregnancy hormones that are relaxing the joints in your pelvis. Follow these instructions at home: Medicines Talk to your health care provider if you're taking medicines. Ask if the medicines are safe to take during pregnancy. Your provider may change the medicines that you take. Do not take any medicines unless told to by your provider. Take a prenatal vitamin that has at least 600 micrograms (mcg) of folic acid. Do not use herbal medicines, illegal drugs, or medicines that are not approved by your provider. Eating and drinking While you're pregnant your body needs extra food for your growing baby. Talk with your provider about what to eat while pregnant. Activity Most women are able to exercise during pregnancy. Exercises may need to change as your pregnancy goes on. Talk to your provider about your activities and exercise routines. Relieving pain and discomfort Wear a good, supportive bra if your breasts hurt. Rest with your legs raised if you have leg cramps or low back pain. Take warm sitz baths to soothe pain from hemorrhoids. Use hemorrhoid cream if your provider says it's okay. Do not douche. Do not use tampons or scented pads. Do not  use hot tubs, steam rooms, or saunas. Safety Wear your seatbelt at all times when you're in a car. Talk to your provider if someone hits you, hurts you, or yells at you. Talk with your provider if you're feeling sad or have thoughts of hurting yourself. Lifestyle Certain things can be harmful while you're pregnant. It's best to avoid the following: Do not drink alcohol,smoke, vape, or use products with nicotine or tobacco in them. If you need help quitting, talk with your provider. Avoid cat litter boxes and soil used by cats. These things carry germs that can cause harm to your pregnancy and your baby. General instructions Keep all follow-up visits. It helps you and your unborn baby stay as healthy as  possible. Write down your questions. Take them to your prenatal visits. Your provider will: Talk with you about your overall health. Give you advice or refer you to specialists who can help with different needs, including: Prenatal education classes. Mental health and counseling. Foods and healthy eating. Ask for help if you need help with food. Where to find more information American Pregnancy Association: americanpregnancy.org [employer]: acog.org Office on [employer] Health: TravelLesson.ca Contact a health care provider if: You have a headache that does not go away when you take medicine. You have any of these problems: You can't eat or drink. You throw up or feel like you may throw up. You have watery poop (diarrhea) for 2 days or more. You have pain when you pee or your pee smells bad. You have been sick for 2 days or more and are not getting better. Contact your provider right away if: You have any of these coming from your vagina: Abnormal discharge. Bad-smelling fluid. Bleeding. Your baby is moving less than usual. You have contractions, belly cramping, or have pain in your pelvis or lower back. You have symptoms of high blood pressure or preeclampsia. These include: A severe, throbbing headache that does not go away. Sudden or extreme swelling of your face, hands, legs, or feet. Vision problems: You see spots. You have blurry vision. Your eyes are sensitive to light. If you can't reach the provider, go to an urgent care or emergency room. Get help right away if: You faint, become confused, or can't think clearly. You have chest pain or trouble breathing. You have any kind of injury, such as from a fall or a car crash. These symptoms may be an emergency. Call 911 right away. Do not wait to see if the symptoms will go away. Do not drive yourself to the hospital. This information is not intended to replace advice given to you by  your health care provider. Make sure you discuss any questions you have with your health care provider. Document Revised: 01/21/2023 Document Reviewed: 08/21/2022 Elsevier Patient Education  2024 [employer]. Third Trimester of Pregnancy  The third trimester of pregnancy is from week 28 through week 40. This is months 7 through 9. The third trimester is a time when your baby is growing fast. Body changes during your third trimester Your body continues to change during this time. The changes usually go away after your baby is born. Physical changes You will continue to gain weight. You may get stretch marks on your hips, belly, and breasts. Your breasts will keep growing and may hurt. A yellow fluid (colostrum) may leak from your breasts. This is the first milk you're making for your baby. Your hair may grow faster and get thicker. In some  cases, you may get hair loss. Your belly button may stick out. You may have more swelling in your hands, face, or ankles. Health changes You may have heartburn. You may feel short of breath. This is caused by the uterus that is now bigger. You may have more aches in the pelvis, back, or thighs. You may have more tingling or numbness in your hands, arms, and legs. You may pee more often. You may have trouble pooping (constipation) or swollen veins in the butt that can itch or get painful (hemorrhoids). Other changes You may have more problems sleeping. You may notice the baby moving lower in your belly (dropping). You may have more fluid coming from your vagina. Your joints may feel loose, and you may have pain around your pelvic bone. Follow these instructions at home: Medicines Take medicines only as told by your health care provider. Some medicines are not safe during pregnancy. Your provider may change the medicines that you take. Do not take any medicines unless told to by your provider. Take a prenatal vitamin that has at least 600 micrograms  (mcg) of folic acid. Do not use herbal medicines, illegal drugs, or medicines that are not approved by your provider. Eating and drinking While you're pregnant your body needs additional nutrition to help support your growing baby. Talk with your provider about your nutritional needs. Activity Most women are able to exercise regularly during pregnancy. Exercise routines may need to change at the end of your pregnancy. Talk to your provider about your activities and exercise routine. Relieving pain and discomfort Rest often with your legs raised if you have leg cramps or low back pain. Take warm sitz baths to soothe pain from hemorrhoids. Use hemorrhoid cream if your provider says it's okay. Wear a good, supportive bra if your breasts hurt. Do not use hot tubs, steam rooms, or saunas. Do not douche. Do not use tampons or scented pads. Safety Talk to your provider before traveling far distances. Wear your seatbelt at all times when you're in a car. Talk to your provider if someone hits you, hurts you, or yells at you. Preparing for birth To prepare for your baby: Take childbirth and breastfeeding classes. Visit the hospital and tour the maternity area. Buy a rear-facing car seat. Learn how to install it in your car. General instructions Avoid cat litter boxes and soil used by cats. These things carry germs that can cause harm to your pregnancy and your baby. Do not drink alcohol, smoke, vape, or use products with nicotine or tobacco in them. If you need help quitting, talk with your provider. Keep all follow-up visits for your third trimester. Your provider will do more exams and tests during this trimester. Write down your questions. Take them to your prenatal visits. Your provider also will: Talk with you about your overall health. Give you advice or refer you to specialists who can help with different needs, including: Mental health and counseling. Foods and healthy eating. Ask for  help if you need help with food. Where to find more information American Pregnancy Association: americanpregnancy.org [employer]: acog.org Office on [employer] Health: TravelLesson.ca Contact a health care provider if: You have a headache that does not go away when you take medicine. You have any of these problems: You can't eat or drink. You have nausea and vomiting. You have watery poop (diarrhea) for 2 days or more. You have pain when you pee, or your pee smells bad. You have  been sick for 2 days or more and aren't getting better. Contact your provider right away if: You have any of these coming from your vagina: Abnormal discharge. Bad-smelling fluid. Bleeding. Your baby is moving less than usual. You have signs of labor: You have any contractions, belly cramping, or have pain in your pelvis or lower back before 37 weeks of pregnancy (preterm labor). You have regular contractions that are less than 5 minutes apart. Your water breaks. You have symptoms of high blood pressure or preeclampsia. These include: A severe, throbbing headache that does not go away. Sudden or extreme swelling of your face, hands, legs, or feet. Vision problems: You see spots. You have blurry vision. Your eyes are sensitive to light. If you can't reach your provider, go to an urgent care or emergency room. Get help right away if: You faint, become confused, or can't think clearly. You have chest pain or trouble breathing. You have any kind of injury, such as from a fall or a car crash. These symptoms may be an emergency. Call 911 right away. Do not wait to see if the symptoms will go away. Do not drive yourself to the hospital. This information is not intended to replace advice given to you by your health care provider. Make sure you discuss any questions you have with your health care provider. Document Revised: 01/21/2023 Document Reviewed: 08/21/2022 Elsevier  Patient Education  2024 [employer].

## 2023-06-10 NOTE — Assessment & Plan Note (Signed)
 Reviewed kick counts and preterm labor warning signs. Instructed to call office or come to hospital with persistent headache, vision changes, regular contractions, leaking of fluid, decreased fetal movement or vaginal bleeding. Prepared for 28 week labs, TDAP at next visit.

## 2023-07-03 DIAGNOSIS — Z419 Encounter for procedure for purposes other than remedying health state, unspecified: Secondary | ICD-10-CM | POA: Diagnosis not present

## 2023-07-08 ENCOUNTER — Other Ambulatory Visit: Payer: Medicaid Other

## 2023-07-08 ENCOUNTER — Encounter: Payer: Self-pay | Admitting: Advanced Practice Midwife

## 2023-07-08 ENCOUNTER — Ambulatory Visit (INDEPENDENT_AMBULATORY_CARE_PROVIDER_SITE_OTHER): Payer: Medicaid Other | Admitting: Advanced Practice Midwife

## 2023-07-08 VITALS — BP 119/70 | HR 94 | Wt 184.4 lb

## 2023-07-08 DIAGNOSIS — Z3A25 25 weeks gestation of pregnancy: Secondary | ICD-10-CM

## 2023-07-08 DIAGNOSIS — Z113 Encounter for screening for infections with a predominantly sexual mode of transmission: Secondary | ICD-10-CM | POA: Diagnosis not present

## 2023-07-08 DIAGNOSIS — Z3480 Encounter for supervision of other normal pregnancy, unspecified trimester: Secondary | ICD-10-CM | POA: Diagnosis not present

## 2023-07-08 DIAGNOSIS — D649 Anemia, unspecified: Secondary | ICD-10-CM | POA: Diagnosis not present

## 2023-07-08 DIAGNOSIS — Z3A29 29 weeks gestation of pregnancy: Secondary | ICD-10-CM | POA: Diagnosis not present

## 2023-07-08 DIAGNOSIS — Z131 Encounter for screening for diabetes mellitus: Secondary | ICD-10-CM | POA: Diagnosis not present

## 2023-07-08 LAB — POCT URINALYSIS DIPSTICK
Bilirubin, UA: NEGATIVE
Blood, UA: NEGATIVE
Glucose, UA: NEGATIVE
Ketones, UA: NEGATIVE
Leukocytes, UA: NEGATIVE
Nitrite, UA: NEGATIVE
Protein, UA: NEGATIVE
Spec Grav, UA: 1.01 (ref 1.010–1.025)
Urobilinogen, UA: 0.2 U/dL
pH, UA: 6.5 (ref 5.0–8.0)

## 2023-07-08 NOTE — Addendum Note (Signed)
 Addended by: Burtis Junes on: 07/08/2023 09:22 AM   Modules accepted: Orders

## 2023-07-08 NOTE — Progress Notes (Signed)
 Routine Prenatal Care Visit  Subjective  Robin Campbell is a 23 y.o. G3P1011 at [redacted]w[redacted]d being seen today for ongoing prenatal care.  She is currently monitored for the following issues for this low-risk pregnancy and has Hypokalemia; Elevated transaminase level; Choledocholithiasis; First degree uterine prolapse; Supervision of other normal pregnancy, antepartum; and Group B Streptococcus urinary tract infection affecting pregnancy, antepartum on their problem list.  ----------------------------------------------------------------------------------- Patient reports some general discomforts by the end of the day. Reviewed third trimester comfort measures- especially consider epsom salt soaks at night. 28 week labs today.  Reviewed third trimester screenings- she will not need 36 week GBS swab due to GBS in urine at NOB.  Contractions: Not present. Vag. Bleeding: None.  Movement: Present. Leaking Fluid denies.  ----------------------------------------------------------------------------------- The following portions of the patient's history were reviewed and updated as appropriate: allergies, current medications, past family history, past medical history, past social history, past surgical history and problem list. Problem list updated.  Objective  Blood pressure 119/70, pulse 94, weight 184 lb 6.4 oz (83.6 kg), last menstrual period 12/17/2022, unknown if currently breastfeeding. Pregravid weight 170 lb (77.1 kg) Total Weight Gain 14 lb 6.4 oz (6.532 kg) Urinalysis: Urine Protein    Urine Glucose    Fetal Status: Fetal Heart Rate (bpm): 143 Fundal Height: 29 cm Movement: Present     General:  Alert, oriented and cooperative. Patient is in no acute distress.  Skin: Skin is warm and dry. No rash noted.   Cardiovascular: Normal heart rate noted  Respiratory: Normal respiratory effort, no problems with respiration noted  Abdomen: Soft, gravid, appropriate for gestational age. Pain/Pressure: Absent      Pelvic:  Cervical exam deferred        Extremities: Normal range of motion.  Edema: None  Mental Status: Normal mood and affect. Normal behavior. Normal judgment and thought content.   Assessment   23 y.o. G3P1011 at [redacted]w[redacted]d by  09/23/2023, by Last Menstrual Period presenting for routine prenatal visit  Plan   three Problems (from 02/05/23 to present)     Problem Noted Diagnosed Resolved   Supervision of other normal pregnancy, antepartum 02/05/2023 by Loran Senters, CMA  No   Overview Addendum 05/13/2023  9:11 AM by Tresea Mall, CNM   Clinical Staff Provider  Office Location  [facility] Dating  09/23/2023 by LMP  Language  English Anatomy US  Complete, female, placenta posterior  Flu Vaccine  declined Genetic Screen  NIPS: Neg/Female  TDaP vaccine   offer Hgb A1C or  GTT Early : Third trimester :   Covid declined   LAB RESULTS   Rhogam     Blood Type   O+ 07/27/21  RSV offer Antibody  Neg  07/27/21  Feeding Plan breast Rubella  Immune 01/08/2021  Contraception undecided RPR   Non reactive 10/18/2021  Circumcision yes HBsAg   Non reactive 01/08/2021  Pediatrician  [facility] HIV  Non reactive  10/18/2021  Support Person [place] Varicella  903  01/08/21  Prenatal Classes no GBS  (For PCN allergy, check sensitivities)     Hep C   <0.1  01/08/2021  BTL Consent  Pap No results found for: "DIAGPAP"  VBAC Consent  Hgb Electro      CF      SMA     GC/CHLM Neg/neg  06/23/2021              Preterm labor symptoms and general obstetric precautions including but not limited to vaginal bleeding, contractions,  leaking of fluid and fetal movement were reviewed in detail with the patient. Please refer to After Visit Summary for other counseling recommendations.   Return in about 2 weeks (around 07/22/2023) for rob.  Tresea Mall, CNM 07/08/2023 9:14 AM

## 2023-07-09 LAB — 28 WEEK RH+PANEL
Basophils Absolute: 0 10*3/uL (ref 0.0–0.2)
Basos: 0 %
EOS (ABSOLUTE): 0.2 10*3/uL (ref 0.0–0.4)
Eos: 1 %
Gestational Diabetes Screen: 97 mg/dL (ref 70–139)
HIV Screen 4th Generation wRfx: NONREACTIVE
Hematocrit: 35.9 % (ref 34.0–46.6)
Hemoglobin: 12.1 g/dL (ref 11.1–15.9)
Immature Grans (Abs): 0.1 10*3/uL (ref 0.0–0.1)
Immature Granulocytes: 1 %
Lymphocytes Absolute: 2.1 10*3/uL (ref 0.7–3.1)
Lymphs: 17 %
MCH: 31.5 pg (ref 26.6–33.0)
MCHC: 33.7 g/dL (ref 31.5–35.7)
MCV: 94 fL (ref 79–97)
Monocytes Absolute: 0.8 10*3/uL (ref 0.1–0.9)
Monocytes: 7 %
Neutrophils Absolute: 9 10*3/uL — ABNORMAL HIGH (ref 1.4–7.0)
Neutrophils: 74 %
Platelets: 281 10*3/uL (ref 150–450)
RBC: 3.84 x10E6/uL (ref 3.77–5.28)
RDW: 12.3 % (ref 11.7–15.4)
RPR Ser Ql: NONREACTIVE
WBC: 12.3 10*3/uL — ABNORMAL HIGH (ref 3.4–10.8)

## 2023-07-12 ENCOUNTER — Encounter: Payer: Self-pay | Admitting: Certified Nurse Midwife

## 2023-07-22 ENCOUNTER — Ambulatory Visit (INDEPENDENT_AMBULATORY_CARE_PROVIDER_SITE_OTHER)

## 2023-07-22 VITALS — BP 110/79 | HR 118 | Wt 189.2 lb

## 2023-07-22 DIAGNOSIS — Z3483 Encounter for supervision of other normal pregnancy, third trimester: Secondary | ICD-10-CM

## 2023-07-22 DIAGNOSIS — Z3A31 31 weeks gestation of pregnancy: Secondary | ICD-10-CM

## 2023-07-22 DIAGNOSIS — Z3480 Encounter for supervision of other normal pregnancy, unspecified trimester: Secondary | ICD-10-CM

## 2023-07-22 DIAGNOSIS — Z23 Encounter for immunization: Secondary | ICD-10-CM

## 2023-07-22 NOTE — Progress Notes (Signed)
    Return Prenatal Note   Assessment/Plan   Plan  23 y.o. G3P1011 at [redacted]w[redacted]d presents for follow-up OB visit. Reviewed prenatal record including previous visit note.  Supervision of other normal pregnancy, antepartum - Tdap vaccination today. - Still undecided on contraception, offered resources which patient declined.  - Reviewed kick counts and preterm labor warning signs. Instructed to call office or come to hospital with persistent headache, vision changes, regular contractions, leaking of fluid, decreased fetal movement or vaginal bleeding.   No orders of the defined types were placed in this encounter.  Return in about 2 weeks (around 08/05/2023) for ROB.   No future appointments.  For next visit:  continue with routine prenatal care     Subjective   23 y.o. G3P1011 at [redacted]w[redacted]d presents for this follow-up prenatal visit.  Patient no complaints, doing well.  Patient reports: Movement: Present Contractions: Not present  Objective   Flow sheet Vitals: Pulse Rate: (!) 118 BP: 110/79 Fundal Height: 31 cm Fetal Heart Rate (bpm): 145 Total weight gain: 19 lb 3.2 oz (8.709 kg)  General Appearance  No acute distress, well appearing, and well nourished Pulmonary   Normal work of breathing Neurologic   Alert and oriented to person, place, and time Psychiatric   Mood and affect within normal limits  Lindalou Hose Sharnese Heath, CNM  07/21/2509:02 AM

## 2023-07-22 NOTE — Assessment & Plan Note (Signed)
-   Tdap vaccination today. - Still undecided on contraception, offered resources which patient declined.  - Reviewed kick counts and preterm labor warning signs. Instructed to call office or come to hospital with persistent headache, vision changes, regular contractions, leaking of fluid, decreased fetal movement or vaginal bleeding.

## 2023-07-22 NOTE — Patient Instructions (Signed)
 Tdap (Tetanus, Diphtheria, Pertussis) Vaccine: What You Need to Know Many vaccine information statements are available in Spanish and other languages. See PromoAge.com.br. 1. Why get vaccinated? Tdap vaccine can prevent tetanus, diphtheria, and pertussis. Diphtheria and pertussis spread from person to person. Tetanus enters the body through cuts or wounds. TETANUS (T) causes painful stiffening of the muscles. Tetanus can lead to serious health problems, including being unable to open the mouth, having trouble swallowing and breathing, or death. DIPHTHERIA (D) can lead to difficulty breathing, heart failure, paralysis, or death. PERTUSSIS (aP), also known as "whooping cough," can cause uncontrollable, violent coughing that makes it hard to breathe, eat, or drink. Pertussis can be extremely serious especially in babies and young children, causing pneumonia, convulsions, brain damage, or death. In teens and adults, it can cause weight loss, loss of bladder control, passing out, and rib fractures from severe coughing. 2. Tdap vaccine Tdap is only for children 7 years and older, adolescents, and adults.  Adolescents should receive a single dose of Tdap, preferably at age 76 or 12 years. Pregnant people should get a dose of Tdap during every pregnancy, preferably during the early part of the third trimester, to help protect the newborn from pertussis. Infants are most at risk for severe, life-threatening complications from pertussis. Adults who have never received Tdap should get a dose of Tdap. Also, adults should receive a booster dose of either Tdap or Td (a different vaccine that protects against tetanus and diphtheria but not pertussis) every 10 years, or after 5 years in the case of a severe or dirty wound or burn. Tdap may be given at the same time as other vaccines. 3. Talk with your health care provider Tell your vaccine provider if the person getting the vaccine: Has had an allergic  reaction after a previous dose of any vaccine that protects against tetanus, diphtheria, or pertussis, or has any severe, life-threatening allergies Has had a coma, decreased level of consciousness, or prolonged seizures within 7 days after a previous dose of any pertussis vaccine (DTP, DTaP, or Tdap) Has seizures or another nervous system problem Has ever had Guillain-Barr Syndrome (also called "GBS") Has had severe pain or swelling after a previous dose of any vaccine that protects against tetanus or diphtheria In some cases, your health care provider may decide to postpone Tdap vaccination until a future visit. People with minor illnesses, such as a cold, may be vaccinated. People who are moderately or severely ill should usually wait until they recover before getting Tdap vaccine.  Your health care provider can give you more information. 4. Risks of a vaccine reaction Pain, redness, or swelling where the shot was given, mild fever, headache, feeling tired, and nausea, vomiting, diarrhea, or stomachache sometimes happen after Tdap vaccination. People sometimes faint after medical procedures, including vaccination. Tell your provider if you feel dizzy or have vision changes or ringing in the ears.  As with any medicine, there is a very remote chance of a vaccine causing a severe allergic reaction, other serious injury, or death. 5. What if there is a serious problem? An allergic reaction could occur after the vaccinated person leaves the clinic. If you see signs of a severe allergic reaction (hives, swelling of the face and throat, difficulty breathing, a fast heartbeat, dizziness, or weakness), call 9-1-1 and get the person to the nearest hospital. For other signs that concern you, call your health care provider.  Adverse reactions should be reported to the Vaccine Adverse Event Reporting  System (VAERS). Your health care provider will usually file this report, or you can do it yourself. Visit the  VAERS website at www.vaers.LAgents.no or call 437-731-6503. VAERS is only for reporting reactions, and VAERS staff members do not give medical advice. 6. The National Vaccine Injury Compensation Program The [employer] Vaccine Injury Compensation Program (VICP) is a federal program that was created to compensate people who may have been injured by certain vaccines. Claims regarding alleged injury or death due to vaccination have a time limit for filing, which may be as short as two years. Visit the VICP website at SpiritualWord.at or call (669) 837-1631 to learn about the program and about filing a claim. 7. How can I learn more? Ask your health care provider. Call your local or state health department. Visit the website of the Food and Drug Administration (FDA) for vaccine package inserts and additional information at FinderList.no. Contact the Centers for Disease Control and Prevention (CDC): Call (484) 483-2759 (1-800-CDC-INFO) or Visit CDC's website at PicCapture.uy. Source: CDC Vaccine Information Statement Tdap (Tetanus, Diphtheria, Pertussis) Vaccine (12/08/2019) This same material is available at FootballExhibition.com.br for no charge. This information is not intended to replace advice given to you by your health care provider. Make sure you discuss any questions you have with your health care provider. Document Revised: 08/05/2022 Document Reviewed: 06/05/2022 Elsevier Patient Education  2024 [employer].

## 2023-08-05 ENCOUNTER — Encounter: Admitting: Obstetrics and Gynecology

## 2023-08-05 DIAGNOSIS — Z3480 Encounter for supervision of other normal pregnancy, unspecified trimester: Secondary | ICD-10-CM

## 2023-08-05 DIAGNOSIS — Z3A33 33 weeks gestation of pregnancy: Secondary | ICD-10-CM

## 2023-08-11 ENCOUNTER — Ambulatory Visit (INDEPENDENT_AMBULATORY_CARE_PROVIDER_SITE_OTHER): Admitting: Obstetrics and Gynecology

## 2023-08-11 ENCOUNTER — Encounter: Payer: Self-pay | Admitting: Obstetrics and Gynecology

## 2023-08-11 VITALS — BP 102/71 | HR 99 | Wt 189.0 lb

## 2023-08-11 DIAGNOSIS — Z3A33 33 weeks gestation of pregnancy: Secondary | ICD-10-CM | POA: Diagnosis not present

## 2023-08-11 DIAGNOSIS — L299 Pruritus, unspecified: Secondary | ICD-10-CM

## 2023-08-11 DIAGNOSIS — Z3483 Encounter for supervision of other normal pregnancy, third trimester: Secondary | ICD-10-CM

## 2023-08-11 DIAGNOSIS — Z3480 Encounter for supervision of other normal pregnancy, unspecified trimester: Secondary | ICD-10-CM

## 2023-08-11 NOTE — Progress Notes (Signed)
 ROB:  EGA = 33.6.  Her only complaint today is of severe itching over her entire body.  She says she has had this for a few days.  She does report that with her first pregnancy she was told she had "fatty liver".  With her second pregnancy she had a cholecystectomy approximately 6 to 7 weeks postpartum.  Cholestasis labs ordered today. She reports daily fetal movement.

## 2023-08-11 NOTE — Progress Notes (Signed)
 ROB. Patient states daily fetal movement. Complaints of itching all over her body, she has not tried anything at home such as anti-itch creams or allergy pills.

## 2023-08-13 ENCOUNTER — Other Ambulatory Visit: Payer: Self-pay

## 2023-08-13 DIAGNOSIS — K831 Obstruction of bile duct: Secondary | ICD-10-CM

## 2023-08-13 LAB — COMPREHENSIVE METABOLIC PANEL WITH GFR
ALT: 210 IU/L — ABNORMAL HIGH (ref 0–32)
AST: 156 IU/L — ABNORMAL HIGH (ref 0–40)
Albumin: 3.2 g/dL — ABNORMAL LOW (ref 4.0–5.0)
Alkaline Phosphatase: 343 IU/L — ABNORMAL HIGH (ref 44–121)
BUN/Creatinine Ratio: 7 — ABNORMAL LOW (ref 9–23)
BUN: 4 mg/dL — ABNORMAL LOW (ref 6–20)
Bilirubin Total: 0.9 mg/dL (ref 0.0–1.2)
CO2: 22 mmol/L (ref 20–29)
Calcium: 8.7 mg/dL (ref 8.7–10.2)
Chloride: 100 mmol/L (ref 96–106)
Creatinine, Ser: 0.58 mg/dL (ref 0.57–1.00)
Globulin, Total: 2.9 g/dL (ref 1.5–4.5)
Glucose: 76 mg/dL (ref 70–99)
Potassium: 4.1 mmol/L (ref 3.5–5.2)
Sodium: 137 mmol/L (ref 134–144)
Total Protein: 6.1 g/dL (ref 6.0–8.5)
eGFR: 131 mL/min/{1.73_m2} (ref >59)

## 2023-08-13 LAB — CBC
Hematocrit: 36.1 % (ref 34.0–46.6)
Hemoglobin: 12 g/dL (ref 11.1–15.9)
MCH: 30.4 pg (ref 26.6–33.0)
MCHC: 33.2 g/dL (ref 31.5–35.7)
MCV: 91 fL (ref 79–97)
Platelets: 299 10*3/uL (ref 150–450)
RBC: 3.95 x10E6/uL (ref 3.77–5.28)
RDW: 11.8 % (ref 11.7–15.4)
WBC: 10.3 10*3/uL (ref 3.4–10.8)

## 2023-08-13 LAB — BILE ACIDS, TOTAL: Bile Acids Total: 94 umol/L — ABNORMAL HIGH (ref 0.0–10.0)

## 2023-08-13 MED ORDER — URSODIOL 300 MG PO CAPS: 300.0000 mg | ORAL_CAPSULE | Freq: Two times a day (BID) | ORAL | 0 refills | Status: DC

## 2023-08-14 DIAGNOSIS — Z419 Encounter for procedure for purposes other than remedying health state, unspecified: Secondary | ICD-10-CM | POA: Diagnosis not present

## 2023-08-17 ENCOUNTER — Other Ambulatory Visit

## 2023-08-17 ENCOUNTER — Ambulatory Visit (INDEPENDENT_AMBULATORY_CARE_PROVIDER_SITE_OTHER): Admitting: Obstetrics and Gynecology

## 2023-08-17 VITALS — BP 127/78 | HR 114 | Wt 189.4 lb

## 2023-08-17 DIAGNOSIS — O26643 Intrahepatic cholestasis of pregnancy, third trimester: Secondary | ICD-10-CM

## 2023-08-17 DIAGNOSIS — Z3A34 34 weeks gestation of pregnancy: Secondary | ICD-10-CM | POA: Diagnosis not present

## 2023-08-17 DIAGNOSIS — K7689 Other specified diseases of liver: Secondary | ICD-10-CM

## 2023-08-17 DIAGNOSIS — Z3480 Encounter for supervision of other normal pregnancy, unspecified trimester: Secondary | ICD-10-CM

## 2023-08-17 DIAGNOSIS — K831 Obstruction of bile duct: Secondary | ICD-10-CM | POA: Diagnosis not present

## 2023-08-17 HISTORY — DX: Obstruction of bile duct: K83.1

## 2023-08-17 NOTE — Patient Instructions (Signed)

## 2023-08-17 NOTE — Progress Notes (Signed)
 ROB:  EGA = 34.5   diagnosed with cholestasis.  Highest bile acid level (94).  Currently taking UDCA.  Repeat bile acids today.  She reports her itching has not yet changed.  Discussed cholestasis and its effect on her and her pregnancy.  Discussed early delivery at 37 weeks - 37.6 weeks.  Weekly NSTs.  NST today reactive.  Kick counts discussed.

## 2023-08-17 NOTE — Progress Notes (Signed)
 ROB [redacted]w[redacted]d: NST today for cholestasis. Patient reports good fetal movement with braxton hicks contractions. She states the actigall is not controlling the itching. Advised can take benadryl as well.  Robin Campbell [birth year] [redacted]w[redacted]d  Fetus A Non-Stress Test Interpretation for 08/17/23  Indication:  Cholestasis  Fetal Heart Rate A Mode: External Variability: Moderate Accelerations: 15 x 15 Decelerations: None Scalp Stimulation: Negative Multiple birth?: No  Uterine Activity Mode: Toco Contraction Frequency (min): None  Interpretation (Fetal Testing) Nonstress Test Interpretation: Reactive Overall Impression: Reassuring for gestational age

## 2023-08-19 LAB — CBC
Hematocrit: 34.1 % (ref 34.0–46.6)
Hemoglobin: 10.9 g/dL — ABNORMAL LOW (ref 11.1–15.9)
MCH: 29.2 pg (ref 26.6–33.0)
MCHC: 32 g/dL (ref 31.5–35.7)
MCV: 91 fL (ref 79–97)
Platelets: 339 10*3/uL (ref 150–450)
RBC: 3.73 x10E6/uL — ABNORMAL LOW (ref 3.77–5.28)
RDW: 11.8 % (ref 11.7–15.4)
WBC: 10.8 10*3/uL (ref 3.4–10.8)

## 2023-08-19 LAB — BILE ACIDS, TOTAL: Bile Acids Total: 8 umol/L (ref 0.0–10.0)

## 2023-08-19 LAB — COMPREHENSIVE METABOLIC PANEL WITH GFR
ALT: 124 IU/L — ABNORMAL HIGH (ref 0–32)
AST: 47 IU/L — ABNORMAL HIGH (ref 0–40)
Albumin: 3.3 g/dL — ABNORMAL LOW (ref 4.0–5.0)
Alkaline Phosphatase: 250 IU/L — ABNORMAL HIGH (ref 44–121)
BUN/Creatinine Ratio: 12 (ref 9–23)
BUN: 6 mg/dL (ref 6–20)
Bilirubin Total: 0.3 mg/dL (ref 0.0–1.2)
CO2: 20 mmol/L (ref 20–29)
Calcium: 8.6 mg/dL — ABNORMAL LOW (ref 8.7–10.2)
Chloride: 103 mmol/L (ref 96–106)
Creatinine, Ser: 0.52 mg/dL — ABNORMAL LOW (ref 0.57–1.00)
Globulin, Total: 2.9 g/dL (ref 1.5–4.5)
Glucose: 73 mg/dL (ref 70–99)
Potassium: 4.6 mmol/L (ref 3.5–5.2)
Sodium: 136 mmol/L (ref 134–144)
Total Protein: 6.2 g/dL (ref 6.0–8.5)
eGFR: 135 mL/min/{1.73_m2} (ref >59)

## 2023-08-24 ENCOUNTER — Other Ambulatory Visit (HOSPITAL_COMMUNITY)
Admission: RE | Admit: 2023-08-24 | Discharge: 2023-08-24 | Disposition: A | Discharge status: Home / Self Care | Source: Ambulatory Visit | Attending: Obstetrics and Gynecology | Admitting: Obstetrics and Gynecology

## 2023-08-24 ENCOUNTER — Encounter: Payer: Self-pay | Admitting: Obstetrics and Gynecology

## 2023-08-24 ENCOUNTER — Ambulatory Visit: Admitting: Obstetrics and Gynecology

## 2023-08-24 ENCOUNTER — Other Ambulatory Visit

## 2023-08-24 VITALS — BP 120/77 | HR 97 | Wt 191.5 lb

## 2023-08-24 DIAGNOSIS — Z113 Encounter for screening for infections with a predominantly sexual mode of transmission: Secondary | ICD-10-CM | POA: Insufficient documentation

## 2023-08-24 DIAGNOSIS — Z3A35 35 weeks gestation of pregnancy: Secondary | ICD-10-CM | POA: Diagnosis not present

## 2023-08-24 DIAGNOSIS — Z3A36 36 weeks gestation of pregnancy: Secondary | ICD-10-CM | POA: Insufficient documentation

## 2023-08-24 DIAGNOSIS — O26643 Intrahepatic cholestasis of pregnancy, third trimester: Secondary | ICD-10-CM

## 2023-08-24 DIAGNOSIS — Z3480 Encounter for supervision of other normal pregnancy, unspecified trimester: Secondary | ICD-10-CM

## 2023-08-24 DIAGNOSIS — K831 Obstruction of bile duct: Secondary | ICD-10-CM | POA: Diagnosis not present

## 2023-08-24 LAB — POCT URINALYSIS DIPSTICK OB
Appearance: NORMAL
Bilirubin, UA: NEGATIVE
Blood, UA: NEGATIVE
Glucose, UA: NEGATIVE
Ketones, UA: NEGATIVE
Leukocytes, UA: NEGATIVE
Nitrite, UA: NEGATIVE
Odor: NORMAL
POC,PROTEIN,UA: NEGATIVE
Spec Grav, UA: 1.015 (ref 1.010–1.025)
Urobilinogen, UA: 0.2 U/dL
pH, UA: 6 (ref 5.0–8.0)

## 2023-08-24 NOTE — Patient Instructions (Signed)

## 2023-08-24 NOTE — Progress Notes (Signed)
 Robin Campbell 10/22/2000 [redacted]w[redacted]d  Fetus A Non-Stress Test Interpretation for 08/24/23  Indication:  Cholestasis  Fetal Heart Rate A Mode: External Baseline Rate (A): 140 bpm Variability: Moderate Accelerations: 15 x 15 Decelerations: None Scalp Stimulation: Negative Multiple birth?: No  Uterine Activity Mode: Toco Contraction Frequency (min): None  Interpretation (Fetal Testing) Nonstress Test Interpretation: Reactive Overall Impression: Reassuring for gestational age

## 2023-08-24 NOTE — Progress Notes (Signed)
 ROB:  EGA = 35.5.  She reports that her itching has now resolved.  She reports daily fetal movement.  Still taking Actigall .  Most recent cholestasis value = 8.  GC/CT performed today.  Patient is GBS positive based on previous UTI. Plan delivery at 37.5 weeks.  Prior to 98.  (Based on highest bile acid salts value) Induction methods discussed.  Will need to set up induction with next visit. NST today reactive. Baby vertex.

## 2023-08-25 ENCOUNTER — Encounter

## 2023-08-25 LAB — CERVICOVAGINAL ANCILLARY ONLY
Chlamydia: NEGATIVE
Comment: NEGATIVE
Comment: NORMAL
Neisseria Gonorrhea: NEGATIVE

## 2023-08-30 ENCOUNTER — Other Ambulatory Visit

## 2023-08-30 ENCOUNTER — Ambulatory Visit (INDEPENDENT_AMBULATORY_CARE_PROVIDER_SITE_OTHER)

## 2023-08-30 VITALS — BP 117/78 | HR 89 | Wt 192.0 lb

## 2023-08-30 DIAGNOSIS — Z3A36 36 weeks gestation of pregnancy: Secondary | ICD-10-CM | POA: Diagnosis not present

## 2023-08-30 DIAGNOSIS — Z3480 Encounter for supervision of other normal pregnancy, unspecified trimester: Secondary | ICD-10-CM

## 2023-08-30 DIAGNOSIS — Z3483 Encounter for supervision of other normal pregnancy, third trimester: Secondary | ICD-10-CM | POA: Diagnosis not present

## 2023-08-30 DIAGNOSIS — K831 Obstruction of bile duct: Secondary | ICD-10-CM

## 2023-08-30 NOTE — Assessment & Plan Note (Addendum)
-   Reactive NST today in clinic.  - IOL scheduled for 5/6 at 0500.

## 2023-08-30 NOTE — Progress Notes (Addendum)
    Return Prenatal Note   Assessment/Plan   Plan  23 y.o. G3P1011 at [redacted]w[redacted]d presents for follow-up OB visit. Reviewed prenatal record including previous visit note.  Supervision of other normal pregnancy, antepartum Reviewed labor warning signs and expectations for birth. Instructed to call office or come to hospital with persistent headache, vision changes, regular contractions, leaking of fluid, decreased fetal movement or vaginal bleeding.    Cholestasis - Reactive NST today in clinic.  - IOL scheduled for 5/6 at 0500.   No orders of the defined types were placed in this encounter.  No follow-ups on file.   No future appointments.  For next visit: IOL scheduled for 5/8     Subjective   23 y.o. G3P1011 at [redacted]w[redacted]d presents for this follow-up prenatal visit.  Patient feeling well, a little nervous about induction.  Patient reports: Movement: Present Contractions: Irregular  Objective   Flow sheet Vitals: Pulse Rate: 89 BP: 117/78 Fundal Height: 36 cm Fetal Heart Rate (bpm): RNST Presentation: Vertex (confirmed by BSUS.) Total weight gain: 22 lb (9.979 kg)  General Appearance  No acute distress, well appearing, and well nourished Pulmonary   Normal work of breathing Neurologic   Alert and oriented to person, place, and time Psychiatric   Mood and affect within normal limits  Robin Campbell Robin Campbell, CNM  04/28/253:58 PM

## 2023-08-30 NOTE — Assessment & Plan Note (Signed)
 Reviewed labor warning signs and expectations for birth. Instructed to call office or come to hospital with persistent headache, vision changes, regular contractions, leaking of fluid, decreased fetal movement or vaginal bleeding.

## 2023-08-30 NOTE — Patient Instructions (Addendum)
 Induction of Labor Identify the different ways that may be used to induce, or bring on labor. To view the content, go to this web address: [email or web address]  This video will expire on: 04/14/2025. If you need access to this video following this date, please reach out to the healthcare provider who assigned it to you. This information is not intended to replace advice given to you by your health care provider. Make sure you discuss any questions you have with your health care provider. Elsevier Patient Education  2024 Elsevier Inc.Nonstress Test A nonstress test is a procedure that is done during pregnancy in order to check the baby's heartbeat. The procedure can help to show if the baby (fetus) is healthy. It is commonly done when: The baby is past his or her due date. The pregnancy is high risk. The baby is moving less than normal. The mother has lost a pregnancy in the past. The health care provider suspects a problem with the baby's growth. There is too much or too little amniotic fluid. The procedure is often done in the third trimester of pregnancy to find out if an early delivery is needed and whether such a delivery is safe. During a nonstress test, the baby's heartbeat is monitored when the baby is resting and when the baby is moving. If the baby is healthy, the heart rate will increase when he or she moves or kicks and will return to normal when he or she rests. Tell a health care provider about: Any allergies you have. Any medical conditions you have. All medicines you are taking, including vitamins, herbs, eye drops, creams, and over-the-counter medicines. Any surgeries you have had. Any past pregnancies you have had. What are the risks? There are no risks to you or your baby from a nonstress test. This procedure should not be painful or uncomfortable. What happens before the procedure? Eat a meal right before the test or as directed by your health care provider.  Food may help encourage the baby to move. Use the restroom right before the test. What happens during the procedure?  Two monitors will be placed on your abdomen. One will record the baby's heart rate and the other will record the contractions of your uterus. You may be asked to lie down on your side or to sit upright. You may be given a button to press when you feel your baby move. Your health care provider will listen to your baby's heartbeat and record it. He or she may also watch your baby's heartbeat on a screen. If the baby seems to be sleeping, you may be asked to drink some juice or soda, eat a snack, or change positions. The procedure may vary among health care providers and hospitals. What can I expect after procedure? Your health care provider will discuss the test results with you and make recommendations for the future. Depending on the results, your health care provider may order additional tests or another course of action. If your health care provider gave you any diet or activity instructions, make sure to follow them. Keep all follow-up visits. This is important. Summary A nonstress test is a procedure that is done during pregnancy in order to check the baby's heartbeat. The procedure can help show if the baby is healthy. The procedure is often done in the third trimester of pregnancy to find out if an early delivery is needed and whether such a delivery is safe. During a nonstress test, the baby's heartbeat is monitored when  the baby is resting and when the baby is moving. If the baby is healthy, the heart rate will increase when he or she moves or kicks and will return to normal when he or she rests. Your health care provider will discuss the test results with you and make recommendations for the future. This information is not intended to replace advice given to you by your health care provider. Make sure you discuss any questions you have with your health care  provider. Document Revised: 01/01/2021 Document Reviewed: 01/29/2020 Elsevier Patient Education  2024 [employer].

## 2023-08-31 IMAGING — US US FETAL BPP W/ NON-STRESS
1 series · 14 of 20 positions shown · non-contrast
Comparison: 06/05/2021

CLINICAL DATA: Post term

EXAM:
LIMITED OBSTETRIC ULTRASOUND AND BIOPHYSICAL PROFILE

[Series 1: us fetal bpp w/nonstress · 20 acquisitions, 14 frames shown]
[im 1/20]
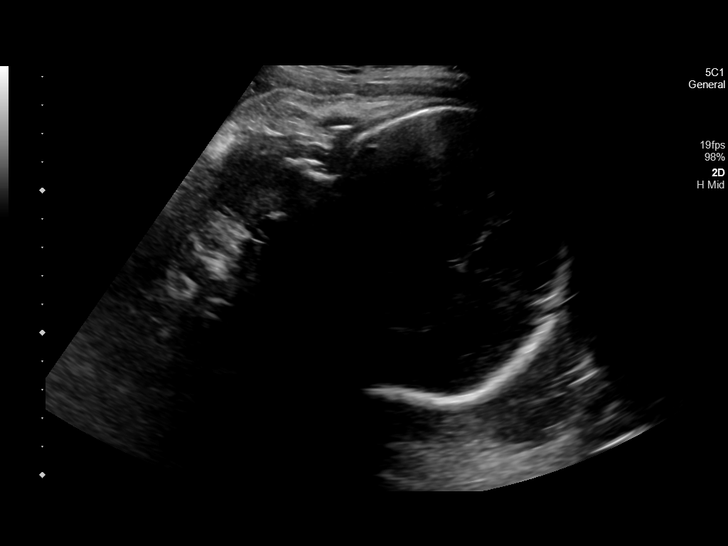
[im 3/20]
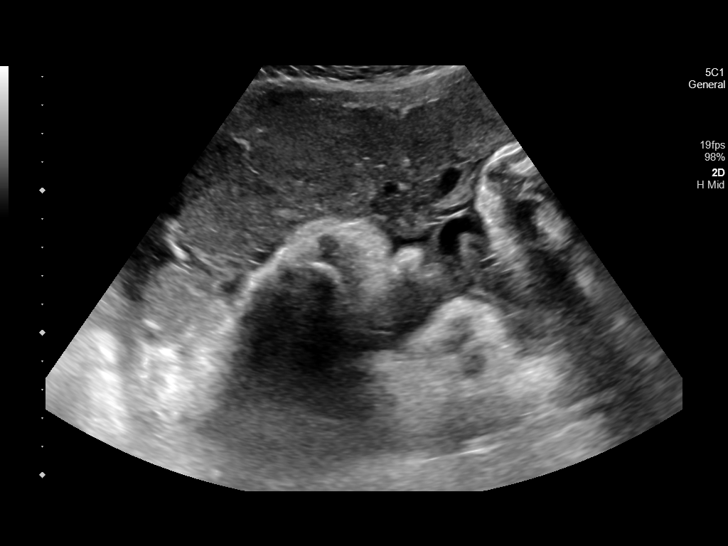
[im 4/20]
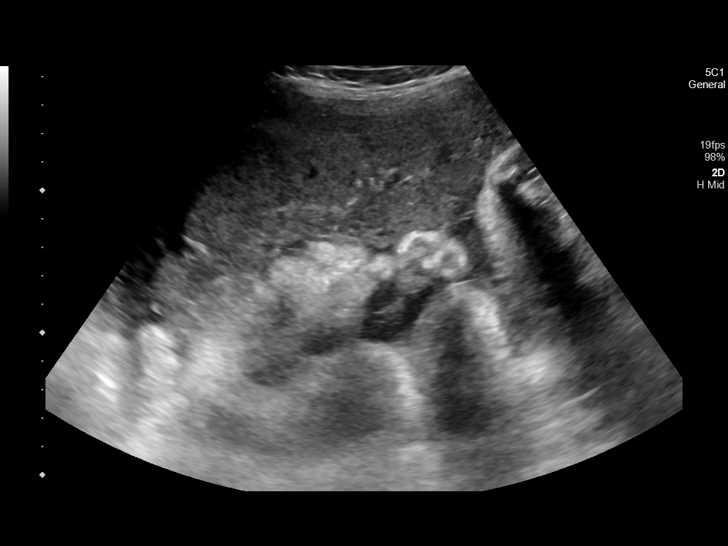
[im 6/20]
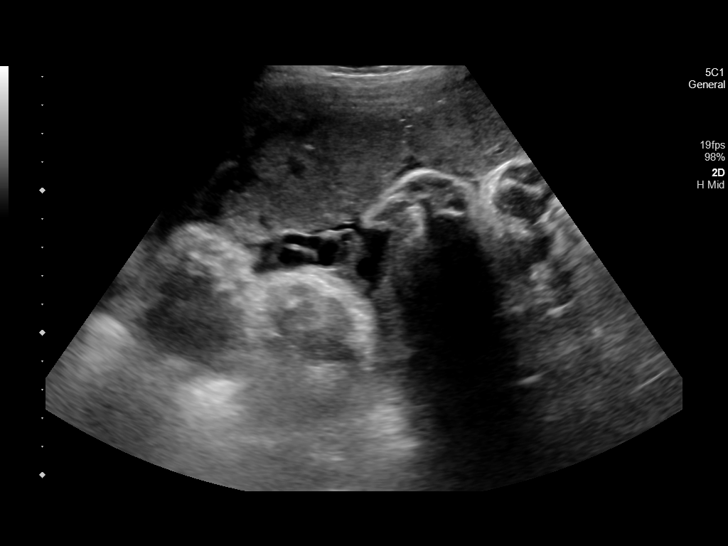
[im 7/20]
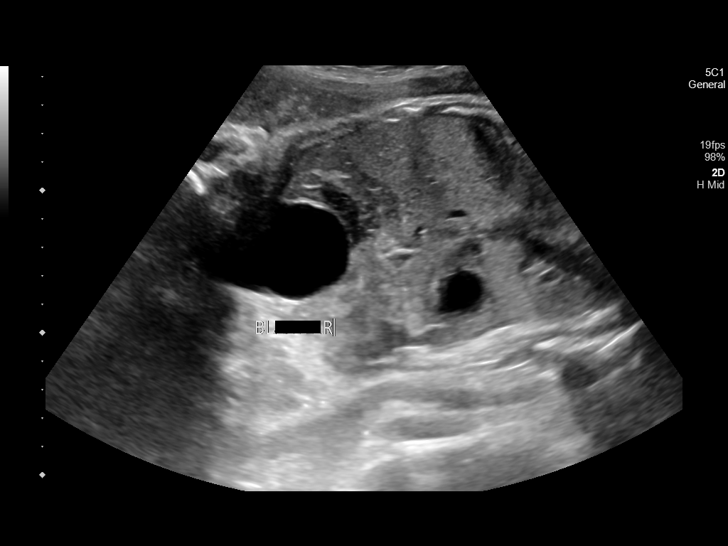
[im 8/20]
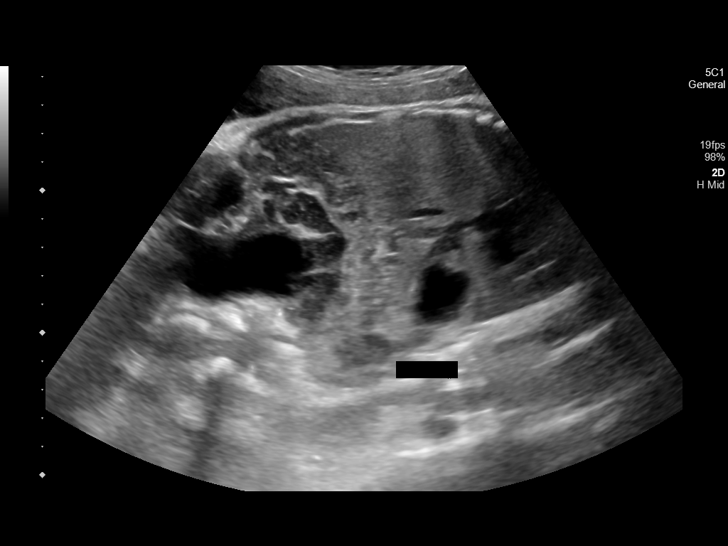
[im 10/20]
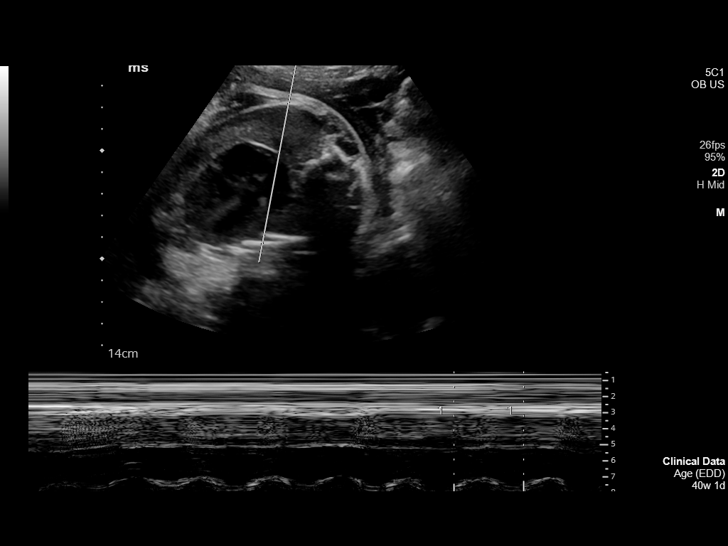
[im 11/20]
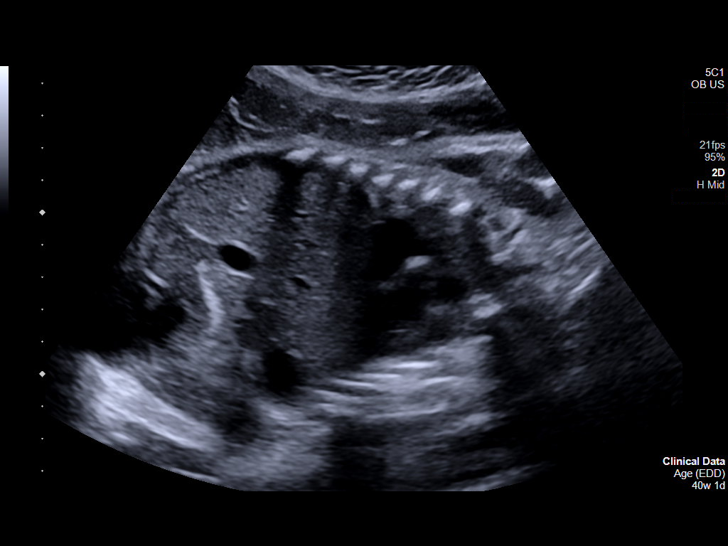
[im 13/20]
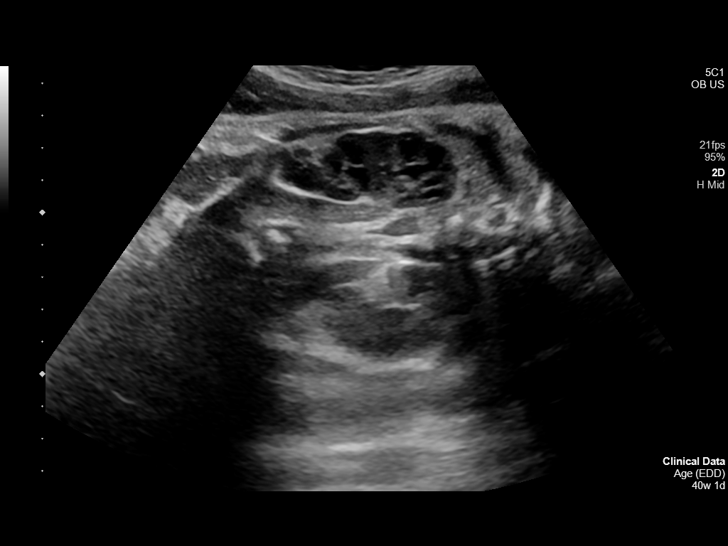
[im 14/20]
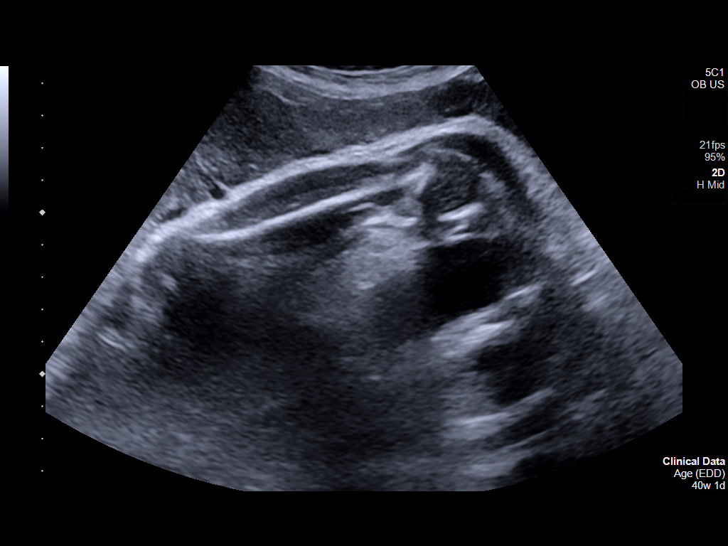
[im 16/20]
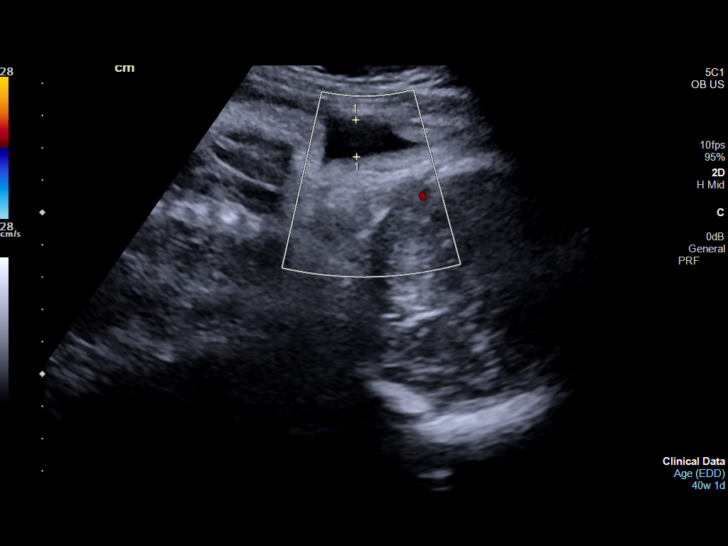
[im 17/20]
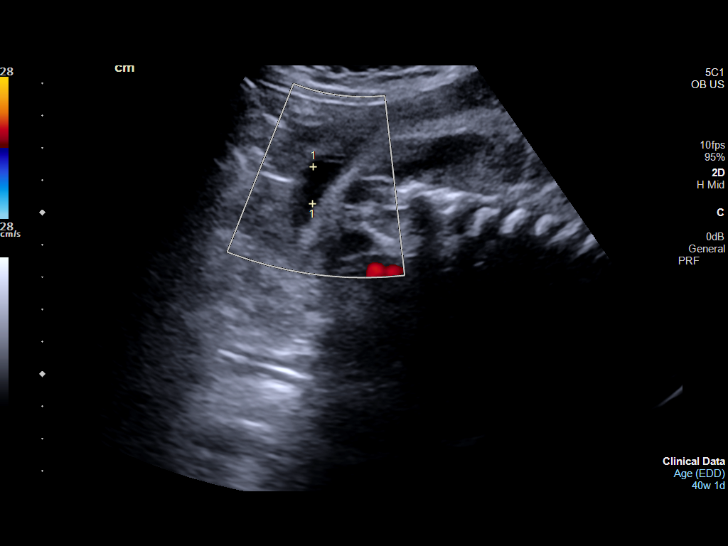
[im 18/20]
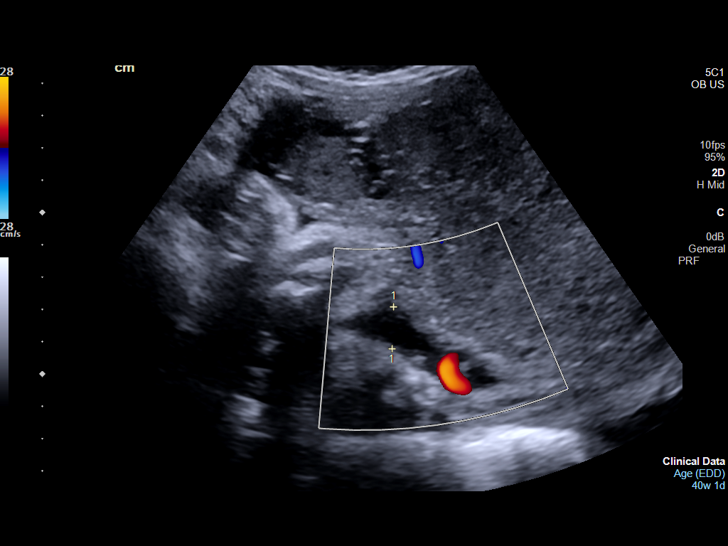
[im 20/20]
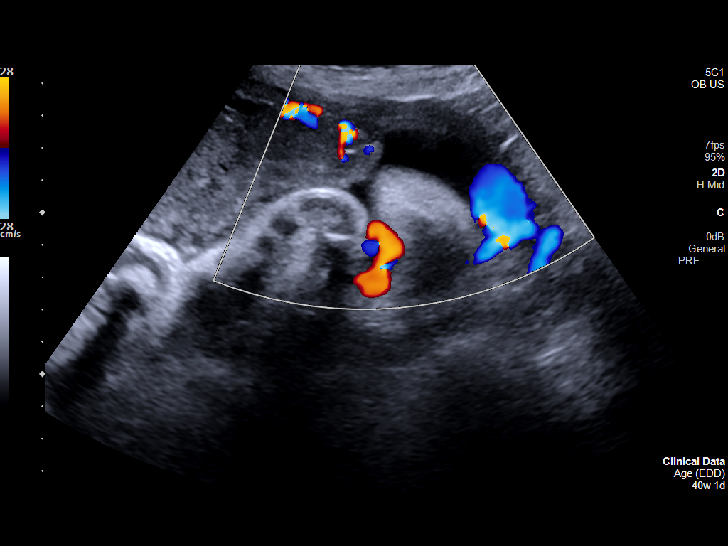

[14 of 20 positions shown; findings below may reference images not displayed]

FINDINGS: Number of Fetuses: 1

Heart Rate:  147 bpm

Movement: Yes

Presentation: Cephalic

Placental Location: Anterior

Previa: No

Amniotic Fluid (Subjective):  Decreased

AFI: 6.6 cm

BPD: Fetal biometry was not performed.

MATERNAL FINDINGS:

Cervix:  Not evaluated.

Uterus/Adnexae: Not evaluated.

Movement:  2  Time: 16 minutes

Breathing: 2

Tone:  2

Amniotic Fluid: 2

Total Score:  8
IMPRESSION: Single live intrauterine pregnancy in cephalic presentation.

Oligohydramnios.

Biophysical profile score is [DATE].

This exam is performed on an emergent basis and does not
comprehensively evaluate fetal size, dating, or anatomy; follow-up
complete OB US should be considered if further fetal assessment is
warranted.

## 2023-09-07 ENCOUNTER — Inpatient Hospital Stay: Admitting: Registered Nurse

## 2023-09-07 ENCOUNTER — Encounter: Payer: Self-pay | Admitting: Obstetrics and Gynecology

## 2023-09-07 ENCOUNTER — Other Ambulatory Visit: Payer: Self-pay

## 2023-09-07 ENCOUNTER — Inpatient Hospital Stay
Admission: EM | Admit: 2023-09-07 | Discharge: 2023-09-08 | DRG: 807 | Disposition: A | Discharge status: Home / Self Care | Attending: Obstetrics | Admitting: Obstetrics

## 2023-09-07 DIAGNOSIS — O9962 Diseases of the digestive system complicating childbirth: Secondary | ICD-10-CM | POA: Diagnosis not present

## 2023-09-07 DIAGNOSIS — Z3490 Encounter for supervision of normal pregnancy, unspecified, unspecified trimester: Principal | ICD-10-CM | POA: Diagnosis present

## 2023-09-07 DIAGNOSIS — O99824 Streptococcus B carrier state complicating childbirth: Secondary | ICD-10-CM | POA: Diagnosis present

## 2023-09-07 DIAGNOSIS — Z833 Family history of diabetes mellitus: Secondary | ICD-10-CM

## 2023-09-07 DIAGNOSIS — Z3A37 37 weeks gestation of pregnancy: Secondary | ICD-10-CM | POA: Diagnosis not present

## 2023-09-07 DIAGNOSIS — Z3480 Encounter for supervision of other normal pregnancy, unspecified trimester: Secondary | ICD-10-CM

## 2023-09-07 DIAGNOSIS — K219 Gastro-esophageal reflux disease without esophagitis: Secondary | ICD-10-CM | POA: Diagnosis not present

## 2023-09-07 DIAGNOSIS — K831 Obstruction of bile duct: Secondary | ICD-10-CM

## 2023-09-07 DIAGNOSIS — O99820 Streptococcus B carrier state complicating pregnancy: Secondary | ICD-10-CM | POA: Diagnosis not present

## 2023-09-07 DIAGNOSIS — Z22330 Carrier of Group B streptococcus: Secondary | ICD-10-CM

## 2023-09-07 DIAGNOSIS — O2662 Liver and biliary tract disorders in childbirth: Secondary | ICD-10-CM | POA: Diagnosis not present

## 2023-09-07 DIAGNOSIS — O26643 Intrahepatic cholestasis of pregnancy, third trimester: Secondary | ICD-10-CM | POA: Diagnosis not present

## 2023-09-07 HISTORY — DX: Encounter for supervision of normal pregnancy, unspecified, unspecified trimester: Z34.90

## 2023-09-07 HISTORY — DX: Carrier of group B Streptococcus: Z22.330

## 2023-09-07 LAB — CBC
HCT: 31.8 % — ABNORMAL LOW (ref 36.0–46.0)
Hemoglobin: 10.3 g/dL — ABNORMAL LOW (ref 12.0–15.0)
MCH: 29.3 pg (ref 26.0–34.0)
MCHC: 32.4 g/dL (ref 30.0–36.0)
MCV: 90.3 fL (ref 80.0–100.0)
Platelets: 237 10*3/uL (ref 150–400)
RBC: 3.52 MIL/uL — ABNORMAL LOW (ref 3.87–5.11)
RDW: 13.1 % (ref 11.5–15.5)
WBC: 11.7 10*3/uL — ABNORMAL HIGH (ref 4.0–10.5)
nRBC: 0 % (ref 0.0–0.2)

## 2023-09-07 LAB — TYPE AND SCREEN
ABO/RH(D): O POS
Antibody Screen: NEGATIVE

## 2023-09-07 LAB — RPR: RPR Ser Ql: NONREACTIVE

## 2023-09-07 MED ORDER — OXYTOCIN BOLUS FROM INFUSION
333.0000 mL | Freq: Once | INTRAVENOUS | Status: AC
  Administered 2023-09-07: 333 mL via INTRAVENOUS

## 2023-09-07 MED ORDER — CALCIUM CARBONATE ANTACID 500 MG PO CHEW: 2.0000 | CHEWABLE_TABLET | ORAL | Status: DC | PRN

## 2023-09-07 MED ORDER — DIPHENHYDRAMINE HCL 25 MG PO CAPS: 25.0000 mg | ORAL_CAPSULE | Freq: Four times a day (QID) | ORAL | Status: DC | PRN

## 2023-09-07 MED ORDER — OXYCODONE-ACETAMINOPHEN 5-325 MG PO TABS: 1.0000 | ORAL_TABLET | ORAL | Status: DC | PRN

## 2023-09-07 MED ORDER — FENTANYL-BUPIVACAINE-NACL 0.5-0.125-0.9 MG/250ML-% EP SOLN
EPIDURAL | Status: AC
  Filled 2023-09-07: qty 250

## 2023-09-07 MED ORDER — WITCH HAZEL-GLYCERIN EX PADS
MEDICATED_PAD | CUTANEOUS | Status: DC | PRN
  Filled 2023-09-07: qty 100

## 2023-09-07 MED ORDER — DOCUSATE SODIUM 100 MG PO CAPS
100.0000 mg | ORAL_CAPSULE | Freq: Two times a day (BID) | ORAL | Status: DC
Start: 2023-09-07 — End: 2023-09-08
  Administered 2023-09-07 – 2023-09-08 (×2): 100 mg via ORAL
  Filled 2023-09-07 (×2): qty 1

## 2023-09-07 MED ORDER — IBUPROFEN 600 MG PO TABS
600.0000 mg | ORAL_TABLET | Freq: Four times a day (QID) | ORAL | Status: DC
  Administered 2023-09-07 – 2023-09-08 (×2): 600 mg via ORAL
  Filled 2023-09-07 (×2): qty 1

## 2023-09-07 MED ORDER — MISOPROSTOL 200 MCG PO TABS
ORAL_TABLET | ORAL | Status: AC
  Filled 2023-09-07: qty 4

## 2023-09-07 MED ORDER — SIMETHICONE 80 MG PO CHEW: 80.0000 mg | CHEWABLE_TABLET | ORAL | Status: DC | PRN

## 2023-09-07 MED ORDER — OXYTOCIN-SODIUM CHLORIDE 30-0.9 UT/500ML-% IV SOLN: 2.5000 [IU]/h | INTRAVENOUS | Status: DC | PRN

## 2023-09-07 MED ORDER — LIDOCAINE-EPINEPHRINE (PF) 1.5 %-1:200000 IJ SOLN
INTRAMUSCULAR | Status: DC | PRN
  Administered 2023-09-07: 3 mL via EPIDURAL

## 2023-09-07 MED ORDER — LIDOCAINE HCL (PF) 1 % IJ SOLN
INTRAMUSCULAR | Status: AC
  Filled 2023-09-07: qty 30

## 2023-09-07 MED ORDER — MISOPROSTOL 25 MCG QUARTER TABLET
25.0000 ug | ORAL_TABLET | Freq: Once | ORAL | Status: AC
  Administered 2023-09-07: 25 ug via VAGINAL
  Filled 2023-09-07: qty 1

## 2023-09-07 MED ORDER — OXYTOCIN 10 UNIT/ML IJ SOLN: 10.0000 [IU] | Freq: Once | INTRAMUSCULAR | Status: DC

## 2023-09-07 MED ORDER — LIDOCAINE HCL (PF) 1 % IJ SOLN
INTRAMUSCULAR | Status: DC | PRN
  Administered 2023-09-07: 3 mL via SUBCUTANEOUS

## 2023-09-07 MED ORDER — FERROUS SULFATE 325 (65 FE) MG PO TABS
325.0000 mg | ORAL_TABLET | Freq: Every day | ORAL | Status: DC
  Administered 2023-09-08: 325 mg via ORAL
  Filled 2023-09-07: qty 1

## 2023-09-07 MED ORDER — FENTANYL-BUPIVACAINE-NACL 0.5-0.125-0.9 MG/250ML-% EP SOLN
EPIDURAL | Status: DC | PRN
  Administered 2023-09-07: 12 mL/h via EPIDURAL

## 2023-09-07 MED ORDER — LIDOCAINE HCL (PF) 1 % IJ SOLN: 30.0000 mL | INTRAMUSCULAR | Status: DC | PRN

## 2023-09-07 MED ORDER — ACETAMINOPHEN 325 MG PO TABS
650.0000 mg | ORAL_TABLET | ORAL | Status: DC | PRN
  Filled 2023-09-07: qty 2

## 2023-09-07 MED ORDER — ONDANSETRON HCL 4 MG/2ML IJ SOLN: 4.0000 mg | Freq: Four times a day (QID) | INTRAMUSCULAR | Status: DC | PRN

## 2023-09-07 MED ORDER — ACETAMINOPHEN 325 MG PO TABS
650.0000 mg | ORAL_TABLET | ORAL | Status: DC | PRN
  Administered 2023-09-07 – 2023-09-08 (×3): 650 mg via ORAL
  Filled 2023-09-07 (×2): qty 2

## 2023-09-07 MED ORDER — METHYLERGONOVINE MALEATE 0.2 MG PO TABS: 0.2000 mg | ORAL_TABLET | ORAL | Status: DC | PRN

## 2023-09-07 MED ORDER — LACTATED RINGERS IV SOLN
INTRAVENOUS | Status: DC
  Administered 2023-09-07: 1000 mL via INTRAVENOUS

## 2023-09-07 MED ORDER — METHYLERGONOVINE MALEATE 0.2 MG/ML IJ SOLN: 0.2000 mg | INTRAMUSCULAR | Status: DC | PRN

## 2023-09-07 MED ORDER — COCONUT OIL OIL
1.0000 | TOPICAL_OIL | Status: DC | PRN
  Filled 2023-09-07: qty 7.5

## 2023-09-07 MED ORDER — OXYTOCIN 10 UNIT/ML IJ SOLN
INTRAMUSCULAR | Status: AC
  Filled 2023-09-07: qty 2

## 2023-09-07 MED ORDER — LACTATED RINGERS IV SOLN: 500.0000 mL | INTRAVENOUS | Status: DC | PRN

## 2023-09-07 MED ORDER — OXYTOCIN-SODIUM CHLORIDE 30-0.9 UT/500ML-% IV SOLN
INTRAVENOUS | Status: AC
Start: 2023-09-07 — End: 2023-09-07
  Filled 2023-09-07: qty 500

## 2023-09-07 MED ORDER — BUPIVACAINE HCL (PF) 0.25 % IJ SOLN
INTRAMUSCULAR | Status: DC | PRN
  Administered 2023-09-07 (×2): 4 mL via EPIDURAL

## 2023-09-07 MED ORDER — PRENATAL MULTIVITAMIN CH: 1.0000 | ORAL_TABLET | Freq: Every day | ORAL | Status: DC

## 2023-09-07 MED ORDER — PENICILLIN G POT IN DEXTROSE 60000 UNIT/ML IV SOLN
3.0000 10*6.[IU] | INTRAVENOUS | Status: DC
  Administered 2023-09-07: 3 10*6.[IU] via INTRAVENOUS
  Filled 2023-09-07: qty 50

## 2023-09-07 MED ORDER — OXYCODONE-ACETAMINOPHEN 5-325 MG PO TABS: 2.0000 | ORAL_TABLET | ORAL | Status: DC | PRN

## 2023-09-07 MED ORDER — PENICILLIN G POTASSIUM 5000000 UNITS IJ SOLR
5.0000 10*6.[IU] | Freq: Once | INTRAMUSCULAR | Status: AC
  Administered 2023-09-07: 5 10*6.[IU] via INTRAVENOUS
  Filled 2023-09-07: qty 5

## 2023-09-07 MED ORDER — OXYTOCIN-SODIUM CHLORIDE 30-0.9 UT/500ML-% IV SOLN
2.5000 [IU]/h | INTRAVENOUS | Status: DC
  Administered 2023-09-07: 2.5 [IU]/h via INTRAVENOUS

## 2023-09-07 MED ORDER — MISOPROSTOL 25 MCG QUARTER TABLET
25.0000 ug | ORAL_TABLET | Freq: Once | ORAL | Status: AC
  Administered 2023-09-07: 25 ug via ORAL
  Filled 2023-09-07: qty 1

## 2023-09-07 MED ORDER — OXYTOCIN-SODIUM CHLORIDE 30-0.9 UT/500ML-% IV SOLN
1.0000 m[IU]/min | INTRAVENOUS | Status: DC
  Administered 2023-09-07: 2 m[IU]/min via INTRAVENOUS
  Filled 2023-09-07: qty 500

## 2023-09-07 MED ORDER — SOD CITRATE-CITRIC ACID 500-334 MG/5ML PO SOLN: 30.0000 mL | ORAL | Status: DC | PRN

## 2023-09-07 MED ORDER — AMMONIA AROMATIC IN INHA
RESPIRATORY_TRACT | Status: AC
  Filled 2023-09-07: qty 10

## 2023-09-07 MED ORDER — BENZOCAINE-MENTHOL 20-0.5 % EX AERO
1.0000 | INHALATION_SPRAY | CUTANEOUS | Status: DC | PRN
  Filled 2023-09-07 (×2): qty 56

## 2023-09-07 NOTE — Progress Notes (Signed)
 Labor Progress Note   ASSESSMENT/PLAN   Robin Campbell 23 y.o.   G3P1011  at [redacted]w[redacted]d here for IOL for ICP.  FWB:  - Fetal well being assessed: Category 1        GBS: - GBS positive - will start PCN now  LABOR: - Cervical ripening, doing well. - Augment with Pitocin  if contractions space - Pain Management:  plans epidural - Anticipate SVD   Labor Progress 0559: FT/50/-3 1001: 3/70/-1  Principal Problem:   Encounter for induction of labor Active Problems:   Cholestasis   GBS carrier   SUBJECTIVE/OBJECTIVE   SUBJECTIVE:  Robin Campbell is feeling her contractions and they are getting more painful. She is coping well.  OBJECTIVE: Vital Signs: Patient Vitals for the past 12 hrs:  BP Temp Temp src Pulse Resp Height Weight  09/07/23 0537 -- -- -- -- -- 5\' 3"  (1.6 m) 87.1 kg  09/07/23 0518 113/62 98.4 F (36.9 C) Oral 91 18 -- --    Last SVE:  Dilation: 3 Effacement (%): 70 Station: -1 Presentation: Vertex Exam by:: Cinda Craze CNM  FHR:   - Baseline: 135 - Variability: moderate - Accels: present - Decels: absent Category 1  UTERINE ACTIVITY:  Contractions: q1.5-4 min  Phylliss Brenner, CNM

## 2023-09-07 NOTE — Progress Notes (Addendum)
 Baby being transferred to [facility] for tachyarrythmia. Robin Campbell is appropriately upset and coping well. Her partner is home with their young child. She is doing well physically except for some residual numbness in her leg. She would like to stay overnight but consider early discharge in the AM to be with the baby. She would like to start pumping breast milk.  Will reassess in the AM and discharge if desired.  Fabrizzio Marcella M Nylan Nevel, CNM

## 2023-09-07 NOTE — Anesthesia Procedure Notes (Signed)
 Epidural Patient location during procedure: OB Start time: 09/07/2023 1:37 PM End time: 09/07/2023 1:45 PM  Staffing Anesthesiologist: Zula Hitch, MD Resident/CRNA: Manya Sells, CRNA Performed: resident/CRNA   Preanesthetic Checklist Completed: patient identified, IV checked, site marked, risks and benefits discussed, surgical consent, monitors and equipment checked, pre-op evaluation and timeout performed  Epidural Patient position: sitting Prep: ChloraPrep Patient monitoring: heart rate, continuous pulse ox and blood pressure Approach: midline Location: L3-L4 Injection technique: LOR saline  Needle:  Needle type: Tuohy  Needle gauge: 17 G Needle length: 9 cm and 9 Needle insertion depth: 6 cm Catheter type: closed end flexible Catheter size: 19 Gauge Catheter at skin depth: 11 cm Test dose: negative and 1.5% lidocaine  with Epi 1:200 K  Assessment Events: blood not aspirated, no cerebrospinal fluid, injection not painful, no injection resistance, no paresthesia and negative IV test  Additional Notes 1 attempt Pt. Evaluated and documentation done after procedure finished. Patient identified. Risks/Benefits/Options discussed with patient including but not limited to bleeding, infection, nerve damage, paralysis, failed block, incomplete pain control, headache, blood pressure changes, nausea, vomiting, reactions to medication both or allergic, itching and postpartum back pain. Confirmed with bedside nurse the patient's most recent platelet count. Confirmed with patient that they are not currently taking any anticoagulation, have any bleeding history or any family history of bleeding disorders. Patient expressed understanding and wished to proceed. All questions were answered. Sterile technique was used throughout the entire procedure. Please see nursing notes for vital signs. Test dose was given through epidural catheter and negative prior to continuing to dose epidural or start  infusion. Warning signs of high block given to the patient including shortness of breath, tingling/numbness in hands, complete motor block, or any concerning symptoms with instructions to call for help. Patient was given instructions on fall risk and not to get out of bed. All questions and concerns addressed with instructions to call with any issues or inadequate analgesia.    Patient tolerated the insertion well without immediate complications.Reason for block:procedure for pain

## 2023-09-07 NOTE — Progress Notes (Signed)
 Labor Progress Note   ASSESSMENT/PLAN   Robin Campbell 23 y.o.   G3P1011  at [redacted]w[redacted]d here for IOL for ICP.  FWB:  - Fetal well being assessed: Category 1        GBS: - GBS positive, received first dose of abx.   LABOR: -  Latent labor, doing well. - Continue to titrate Pitocin  - Consider AROM when head well-applied to cervix and GBS adequately treated - Pain Management: Epidural - Anticipate SVD   Labor Progress 0559: FT/50/-1 1001: 3/70/-1 1200: 4/80/-1 1405: 5/80/-1  Principal Problem:   Encounter for induction of labor Active Problems:   Cholestasis   GBS carrier   SUBJECTIVE/OBJECTIVE   SUBJECTIVE:  Robin Campbell is comfortable with her epidural.    OBJECTIVE: Vital Signs: Patient Vitals for the past 12 hrs:  BP Temp Temp src Pulse Resp SpO2 Height Weight  09/07/23 1345 115/68 -- -- 93 -- 96 % -- --  09/07/23 1340 116/65 -- -- 97 -- 98 % -- --  09/07/23 1324 -- -- -- -- 16 -- -- --  09/07/23 1323 -- 98.7 F (37.1 C) Oral -- -- -- -- --  09/07/23 1001 124/76 -- -- (!) 106 -- -- -- --  09/07/23 0537 -- -- -- -- -- -- 5\' 3"  (1.6 m) 87.1 kg  09/07/23 0518 113/62 98.4 F (36.9 C) Oral 91 18 -- -- --    Last SVE:  Dilation: 5 Effacement (%): 80 Station: -1 Presentation: Vertex Exam by:: Swamson CNM  FHR:   - Baseline: 135 - Variability: moderate - Accels: present - Decels: absent Category 1  UTERINE ACTIVITY:  Contractions: q 1.5-3 min  Phylliss Brenner, CNM

## 2023-09-07 NOTE — Discharge Summary (Signed)
 Postpartum Discharge Summary  Date of Service updated***     Patient Name: Robin Campbell DOB: 2000-05-24 MRN: [id number]  Date of admission: 09/07/2023 Delivery date:09/07/2023 Delivering provider: Phylliss Brenner Date of discharge: 09/07/2023  Admitting diagnosis: Encounter for induction of labor [Z34.90] Intrauterine pregnancy: [redacted]w[redacted]d     Secondary diagnosis:  Principal Problem:   Encounter for induction of labor Active Problems:   Cholestasis   GBS carrier  Additional problems: none    Discharge diagnosis: Term Pregnancy Delivered                                              Post partum procedures:{Postpartum procedures:23558} Augmentation: Pitocin  and Cytotec  Complications: None  Hospital course: Induction of Labor With Vaginal Delivery   23 y.o. yo G3P1011 at [redacted]w[redacted]d was admitted to the hospital 09/07/2023 for induction of labor.  Indication for induction: Cholestasis of pregnancy.  Patient had an uncomplicated labor course Membrane Rupture Time/Date: 2:57 PM,09/07/2023  Delivery Method:Vaginal, Spontaneous Operative Delivery:N/A Episiotomy: N/A Lacerations:  1st degree Details of delivery can be found in separate delivery note.  Patient had a postpartum course complicated by***. Patient is discharged home 09/07/23.  Newborn Data: Birth date:09/07/2023 Birth time:3:29 PM Gender:Female Living status:  Apgars:9 ,9  Weight:   Magnesium  Sulfate received: No BMZ received: No Rhophylac:N/A MMR:N/A T-DaP:Given prenatally Flu: No RSV Vaccine received: No Transfusion:{Transfusion received:30440034} Immunizations administered: Immunization History  Administered Date(s) Administered   Tdap 06/09/2021, 07/22/2023    Physical exam  Vitals:   09/07/23 1420 09/07/23 1450 09/07/23 1500 09/07/23 1551  BP: (!) 95/53 (!) 104/58    Pulse: 84 85    Resp:   20 16  Temp:    98.6 F (37 C)  TempSrc:    Oral  SpO2: 96% 97%    Weight:      Height:       General: {Exam;  general:21111117} Lochia: {Desc; appropriate/inappropriate:30686::"appropriate"} Uterine Fundus: {Desc; firm/soft:30687} Incision: {Exam; incision:21111123} DVT Evaluation: {Exam; dvt:2111122} Labs: Lab Results  Component Value Date   WBC 11.7 (H) 09/07/2023   HGB 10.3 (L) 09/07/2023   HCT 31.8 (L) 09/07/2023   MCV 90.3 09/07/2023   PLT 237 09/07/2023      Latest Ref Rng & Units 08/17/2023    9:14 AM  CMP  Glucose 70 - 99 mg/dL 73   BUN 6 - 20 mg/dL 6   Creatinine 4.09 - 8.11 mg/dL 9.14   Sodium 782 - 956 mmol/L 136   Potassium 3.5 - 5.2 mmol/L 4.6   Chloride 96 - 106 mmol/L 103   CO2 20 - 29 mmol/L 20   Calcium 8.7 - 10.2 mg/dL 8.6   Total Protein 6.0 - 8.5 g/dL 6.2   Total Bilirubin 0.0 - 1.2 mg/dL 0.3   Alkaline Phos 44 - 121 IU/L 250   AST 0 - 40 IU/L 47   ALT 0 - 32 IU/L 124    Edinburgh Score:    02/05/2023    3:34 PM  Edinburgh Postnatal Depression Scale Screening Tool  I have been able to laugh and see the funny side of things. 0  I have looked forward with enjoyment to things. 0  I have blamed myself unnecessarily when things went wrong. 0  I have been anxious or worried for no good reason. 0  I have felt scared or panicky for  no good reason. 0  Things have been getting on top of me. 0  I have been so unhappy that I have had difficulty sleeping. 0  I have felt sad or miserable. 0  I have been so unhappy that I have been crying. 0  The thought of harming myself has occurred to me. 0  Edinburgh Postnatal Depression Scale Total 0      After visit meds:  Allergies as of 09/07/2023   No Known Allergies   Med Rec must be completed prior to using this [facility]***        Discharge home in stable condition Infant Feeding: {Baby feeding:23562} Infant Disposition:{CHL IP OB HOME WITH UYQIHK:74259} Discharge instruction: per After Visit Summary and Postpartum booklet. Activity: Advance as tolerated. Pelvic rest for 6 weeks.  Diet: routine  diet Anticipated Birth Control: {Birth Control:23956} Postpartum Appointment:{Outpatient follow up:23559} Additional Postpartum F/U: {PP Procedure:23957} Future Appointments:No future appointments. Follow up Visit:      09/07/2023 Phylliss Brenner, CNM

## 2023-09-07 NOTE — H&P (Signed)
 [facility] Labor & Delivery  History and Physical   HPI   Chief Complaint: here for scheduled induction of labor  Robin Campbell is a 23 y.o. G3P1011 at [redacted]w[redacted]d who presents for scheduled induction of labor due to cholestasis.   Endorses fetal movement, denies loss of fluid or vaginal bleeding.  Pregnancy Complications Patient Active Problem List   Diagnosis Date Noted   Encounter for induction of labor 09/07/2023   GBS carrier 09/07/2023   [redacted] weeks gestation of pregnancy 08/24/2023   Cholestasis 08/17/2023   [redacted] weeks gestation of pregnancy 08/17/2023   Group B Streptococcus urinary tract infection affecting pregnancy, antepartum 03/22/2023   Supervision of other normal pregnancy, antepartum 02/05/2023   First degree uterine prolapse 06/19/2022   Choledocholithiasis 09/17/2021    Review of Systems A twelve point review of systems was negative except as stated in HPI.   HISTORY   Medications Medications Prior to Admission  Medication Sig Dispense Refill Last Dose/Taking   prenatal vitamin w/FE, FA (NATACHEW) 29-1 MG CHEW chewable tablet Chew 2 tablets by mouth daily at 12 noon.   Past Week   ursodiol  (ACTIGALL ) 300 MG capsule Take 1 capsule (300 mg total) by mouth 2 (two) times daily. 60 capsule 0 Past Week    Allergies has no known allergies.   OB History OB History  Gravida Para Term Preterm AB Living  3 1 1  0 1 1  SAB IAB Ectopic Multiple Live Births  1 0 0 0 1    # Outcome Date GA Lbr Len/2nd Weight Sex Type Anes PTL Lv  3 Current           2 SAB 09/2022     SAB     1 Term 07/27/21 [redacted]w[redacted]d  3204 g [name]    Past Medical History Past Medical History:  Diagnosis Date   Elevated transaminase level 01/15/2021   Encounter for care or examination of lactating mother 07/27/2021   Hypokalemia 01/15/2021   Medical history non-contributory     Past Surgical History Past Surgical History:  Procedure Laterality Date   CHOLECYSTECTOMY   2023   ERCP N/A 09/18/2021   Procedure: ENDOSCOPIC RETROGRADE CHOLANGIOPANCREATOGRAPHY (ERCP);  Surgeon: Marnee Sink, MD;  Location: [facility] ENDOSCOPY;  Service: Endoscopy;  Laterality: N/A;    Social History  reports that she has never smoked. She has never been exposed to tobacco smoke. She has never used smokeless tobacco. She reports that she does not currently use alcohol. She reports that she does not use drugs.   Family History family history includes Colon cancer in her father; Diabetes in her maternal grandfather; Healthy in her brother, brother, brother, brother, brother, maternal grandmother, sister, sister, sister, sister, and sister; Other in her mother; Throat cancer in her father.   PHYSICAL EXAM   Vitals:   09/07/23 0518 09/07/23 0537  BP: 113/62   Pulse: 91   Resp: 18   Temp: 98.4 F (36.9 C)   TempSrc: Oral   Weight:  87.1 kg  Height:  5\' 3"  (1.6 m)    Constitutional: No acute distress, well appearing, and well nourished. Neurologic: She is alert and conversational.  Psychiatric: She has a normal mood and affect.  Musculoskeletal: Normal gait, grossly normal range of motion Cardiovascular: Normal rate.   Pulmonary/Chest: Normal work of breathing.  Gastrointestinal/Abdominal: Soft. Gravid. There is no tenderness.  Skin: Skin is warm and dry. No rash noted.  Genitourinary: Normal external female genitalia.  SVE:   Dilation: Fingertip Effacement (%): 50 Station: -3 Exam by:: Ecologist RN   NST Interpretation Indication: IOL Baseline: 140 bpm Variability: moderate Accelerations: present Decelerations: absent Contractions: irregular Time noted:  See OBIX Impression: reactive Authenticated by: Forestine Igo    PRENATAL LABS FROM OB RESULTS CONSOLE  ABO, Rh: --/--/PENDING (05/06 1610) Antibody: PENDING (05/06 0529) Rubella: 2.89 (11/11 1007) RPR: Non Reactive (03/06 0915)  HBsAg: Negative (11/11 1007)  HIV: Non Reactive (03/06 0915)  GBS:  Positive  ENCOUNTER LABS    Results for orders placed or performed during the hospital encounter of 09/07/23 (from the past 24 hours)  CBC     Status: Abnormal   Collection Time: 09/07/23  5:29 AM  Result Value Ref Range   WBC 11.7 (H) 4.0 - 10.5 K/uL   RBC 3.52 (L) 3.87 - 5.11 MIL/uL   Hemoglobin 10.3 (L) 12.0 - 15.0 g/dL   HCT 96.0 (L) 45.4 - 09.8 %   MCV 90.3 80.0 - 100.0 fL   MCH 29.3 26.0 - 34.0 pg   MCHC 32.4 30.0 - 36.0 g/dL   RDW 11.9 14.7 - 82.9 %   Platelets 237 150 - 400 K/uL   nRBC 0.0 0.0 - 0.2 %  Type and screen     Status: None (Preliminary result)   Collection Time: 09/07/23  5:29 AM  Result Value Ref Range   ABO/RH(D) PENDING    Antibody Screen PENDING    Sample Expiration      09/10/2023,2359 Performed at [facility], [address] [place], [place] [address]        ASSESSMENT AND PLAN   [name] is a 23 y.o. G3P1011 at [redacted]w[redacted]d with EDD: 09/23/2023, by Last Menstrual Period admitted for cholestasis.  Misoprostol  25mcg po & 25mcg pv for ripening.   Fetal Status: - cephalic presentation by ultrasound 4/28 - EFW: 7lb by [place] - CEFM - FHT currently cat I  GBS positive - Penicillin for prophylaxis  Labs/Immunizations: TDAP: recevied prenatally, 3/20 Flu: n/a Rubella: immune Varicella: immune HIV: NR Hep B: Negative Hep C: NR RPR: NR GBS: positiive  Lab Results  Component Value Date   VZVIGG Reactive 03/15/2023   HIV Non Reactive 07/08/2023     Postpartum Plan: - Feeding: Breast Milk - Contraception: plans undecided - Prenatal Care Provider: [facility]  Attending Dr. Luster Salters was immediately available for the care of the patient.

## 2023-09-07 NOTE — Anesthesia Preprocedure Evaluation (Signed)
 Anesthesia Evaluation  Patient identified by MRN, date of birth, ID band Patient awake    Reviewed: Allergy & Precautions, H&P , NPO status , Patient's Chart, lab work & pertinent test results  Airway Mallampati: II       Dental   Pulmonary    Pulmonary exam normal        Cardiovascular Normal cardiovascular exam     Neuro/Psych    GI/Hepatic ,GERD  ,,  Endo/Other    Renal/GU      Musculoskeletal   Abdominal   Peds  Hematology   Anesthesia Other Findings   Reproductive/Obstetrics (+) Pregnancy                             Anesthesia Physical Anesthesia Plan  ASA: 2  Anesthesia Plan: Epidural   Post-op Pain Management:    Induction:   PONV Risk Score and Plan:   Airway Management Planned:   Additional Equipment:   Intra-op Plan:   Post-operative Plan:   Informed Consent: I have reviewed the patients History and Physical, chart, labs and discussed the procedure including the risks, benefits and alternatives for the proposed anesthesia with the patient or authorized representative who has indicated his/her understanding and acceptance.     Dental Advisory Given  Plan Discussed with: Anesthesiologist and CRNA  Anesthesia Plan Comments:        Anesthesia Quick Evaluation

## 2023-09-08 LAB — CBC
HCT: 31.2 % — ABNORMAL LOW (ref 36.0–46.0)
Hemoglobin: 10 g/dL — ABNORMAL LOW (ref 12.0–15.0)
MCH: 29.9 pg (ref 26.0–34.0)
MCHC: 32.1 g/dL (ref 30.0–36.0)
MCV: 93.1 fL (ref 80.0–100.0)
Platelets: 201 10*3/uL (ref 150–400)
RBC: 3.35 MIL/uL — ABNORMAL LOW (ref 3.87–5.11)
RDW: 13 % (ref 11.5–15.5)
WBC: 14.3 10*3/uL — ABNORMAL HIGH (ref 4.0–10.5)
nRBC: 0 % (ref 0.0–0.2)

## 2023-09-08 MED ORDER — DOCUSATE SODIUM 100 MG PO CAPS: 100.0000 mg | ORAL_CAPSULE | Freq: Two times a day (BID) | ORAL | 0 refills | Status: AC

## 2023-09-08 MED ORDER — IBUPROFEN 600 MG PO TABS: 600.0000 mg | ORAL_TABLET | Freq: Four times a day (QID) | ORAL | 0 refills | Status: AC

## 2023-09-08 MED ORDER — FERROUS SULFATE 325 (65 FE) MG PO TABS: 325.0000 mg | ORAL_TABLET | Freq: Every day | ORAL | 0 refills | Status: AC

## 2023-09-08 NOTE — Progress Notes (Signed)
 Patient discharged. Discharge instructions given. Patient verbalizes understanding. Transported by axillary.

## 2023-09-08 NOTE — Discharge Instructions (Signed)
Discharge instructions Bleeding: Your bleeding could continue up to 6 weeks, the flow should gradually decrease and the color should become dark then lightened over the next couple of weeks. If you notice you are bleeding heavily or passing clots larger than the size of your fist, PLEASE call your physician. No TAMPONS, DOUCHING, ENEMAS OR SEXUAL INTERCOURSE for 6 weeks. Stitches: Shower daily with mild soap and water. Stitches will dissolve over the next couple of weeks, if you experience any discomfort in the vaginal area you may sit in warm water 15-20 minutes, 3-4 times per day. Just enough water to cover vaginal area. AfterPains: This is the uterus contracting back to its normal position and size. Use medications prescribed or recommended by your physician to help relieve this discomfort. Bowels/Hemorrhoids: Drink plenty of water and stay active. Increase fiber, fresh fruits and vegetables in your diet. Rest/Activity: Rest when the baby is resting;  Do not lift > 10 lbs for 6 weeks. No driving for 1-2 weeks. Bathing: Shower daily! Diet: Continue daily prenatal vitamin and iron until your follow up visit to help replenish nutrients and vitamins. If breastfeeding eat extra calories and increase your fluid intake to 12 glasses a day. Contraception: Consult with your provider on what method of birth control you would like to use. Breastfeeding: You may have a slight fever when your milk comes in, but it should go away on its own. If it does not, and rises above 101.0 please call the doctor. Bottlefeeding: wear a snug fitting bra without underwires continuously for 3-5days, avoid any nipple/breast stimulation. If engorgement occurs, take ibuprofen as prescribed and apply fresh green cabbage leaves directly to your breasts inside the bra cups. Postpartum "BLUES": It is common to emotional days after delivery, however if it persist for greater than 2 weeks or if you feel concerned please let your physician  know immediately. This is hormone driven and nothing you can control so please let someone know how you feel. Follow Up Visit: Please schedule a follow up visit with your delivering provider  Call office if you have any of the following: headache, visual changes, fever >101.0 F, chills, breast concerns, excessive vaginal bleeding, incision drainage or problems, leg pain or redness, depression or any other concerns.  For concerns about your baby, please call your pediatrician For breastfeeding concerns, the lactation consultant can be reached at [phone]

## 2023-09-08 NOTE — Final Progress Note (Signed)
 Post Partum Day 0 Subjective: Robin Campbell is feeling well overall. She is ambulating, voiding, and tolerating POs without difficulty. Her pain is well-controlled and her bleeding is WNL. She is sad and worried about the baby, but her mood is stable. She slept through the night last night has not yet started pumping. She desires early d/c to be near Agricultural engineer.  Objective: Blood pressure 106/70, pulse 71, temperature 98.2 F (36.8 C), temperature source Oral, resp. rate 18, height 5\' 3"  (1.6 m), weight 87.1 kg, last menstrual period 12/17/2022, SpO2 99%, unknown if currently breastfeeding.  Physical Exam:  General: alert and cooperative Lochia: appropriate Uterine Fundus: firm Perineum: healing well DVT Evaluation: No evidence of DVT seen on physical exam.  Recent Labs    09/07/23 0529 09/08/23 0606  HGB 10.3* 10.0*  HCT 31.8* 31.2*    Assessment/Plan: Discharge home Discharge instructions reviewed Undecided about contraception Encoutaged to pump q 2-3 hours Video visit in 2 weeks, office visit in 6 weeks   LOS: 1 day   Phylliss Brenner, CNM 09/08/2023, 7:29 AM

## 2023-09-08 NOTE — Anesthesia Postprocedure Evaluation (Signed)
 Anesthesia Post Note  Patient: Jainaba Digangi  Procedure(s) Performed: AN AD HOC LABOR EPIDURAL  Patient location during evaluation: Mother Baby Anesthesia Type: Epidural Level of consciousness: awake and alert Pain management: pain level controlled Vital Signs Assessment: post-procedure vital signs reviewed and stable Respiratory status: spontaneous breathing, nonlabored ventilation and respiratory function stable Cardiovascular status: stable Postop Assessment: no headache, no backache and epidural receding Anesthetic complications: no   No notable events documented.   Last Vitals:  Vitals:   09/08/23 0327 09/08/23 0838  BP: 106/70 108/70  Pulse: 71 80  Resp: 18 17  Temp: 36.8 C 36.6 C  SpO2: 99% 100%    Last Pain:  Vitals:   09/08/23 0840  TempSrc:   PainSc: 2                  Jahmarion Popoff Darlen Eglin

## 2023-09-09 ENCOUNTER — Other Ambulatory Visit: Payer: Self-pay | Admitting: Obstetrics and Gynecology

## 2023-09-09 DIAGNOSIS — K831 Obstruction of bile duct: Secondary | ICD-10-CM

## 2023-09-13 DIAGNOSIS — Z419 Encounter for procedure for purposes other than remedying health state, unspecified: Secondary | ICD-10-CM | POA: Diagnosis not present

## 2023-09-14 ENCOUNTER — Telehealth (INDEPENDENT_AMBULATORY_CARE_PROVIDER_SITE_OTHER): Admitting: Obstetrics

## 2023-09-14 DIAGNOSIS — M6289 Other specified disorders of muscle: Secondary | ICD-10-CM

## 2023-09-14 NOTE — Progress Notes (Signed)
   Virtual Visit via Video Note   I connected with Robin Campbell  on 09/14/23 2:33 PM by a video enabled telemedicine application and verified that I am speaking with the correct person using two identifiers.   Location: Patient: home Provider: [facility]   I discussed the limitations of evaluation and management by telemedicine and the availability of in person appointments. The patient expressed understanding and agreed to proceed. Subjective:    Subjective  Robin Campbell is a 23 y.o. female who presents for a postpartum visit. She is2 week  postpartum following a spontaneous vaginal delivery. I have fully reviewed the prenatal and intrapartum course and was present at the delivery. The delivery was at [redacted]w[redacted]d. She was induced for ICP.  Anesthesia: epidural.    Postpartum course has been normal. She has no pain and no other concerns. Baby Robin Campbell was discharged from [facility] yesterday. He was diagnosed with SVT. Baby is feeding by  breast and bottle. Bleeding: normal. Bowel function is normal. Bladder function is normal.  .  Contraception method: NFP. Postpartum depression screening:      Review of Systems Pertinent positives noted in HPI. Remainder of comprehensive ROS otherwise negative.     Objective:      Objective [] Expand by Default There were no vitals taken for this visit.  General:  alert, cooperative, and no distress        Assessment:    Normal postpartum course, mood stable.   Plan:      Plan -Anticipatory guidance about the postpartum period -Reviewed s/s of PPD and PPA -Discussed when to seek medical care -Office visit at 6 weeks postpartum  I discussed the assessment and treatment plan with the patient. The patient was provided an opportunity to ask questions and all were answered. The patient agreed with the plan and demonstrated an understanding of the instructions.   The patient was advised to call back or seek an in-person evaluation if the  symptoms worsen or if the condition fails to improve as anticipated.   I provided 8 minutes of non-face-to-face time during this encounter.     Kelten Enochs M Fredrik Mogel, CNM

## 2023-09-22 ENCOUNTER — Ambulatory Visit (INDEPENDENT_AMBULATORY_CARE_PROVIDER_SITE_OTHER): Admitting: Obstetrics

## 2023-09-22 ENCOUNTER — Encounter: Payer: Self-pay | Admitting: Obstetrics

## 2023-09-22 VITALS — BP 111/74 | HR 90 | Ht 63.0 in | Wt 169.0 lb

## 2023-09-22 DIAGNOSIS — R102 Pelvic and perineal pain: Secondary | ICD-10-CM | POA: Diagnosis not present

## 2023-09-22 DIAGNOSIS — O3480 Maternal care for other abnormalities of pelvic organs, unspecified trimester: Secondary | ICD-10-CM

## 2023-09-22 LAB — POCT URINALYSIS DIPSTICK
Bilirubin, UA: NEGATIVE
Glucose, UA: NEGATIVE
Ketones, UA: NEGATIVE
Leukocytes, UA: NEGATIVE
Nitrite, UA: NEGATIVE
Protein, UA: NEGATIVE
Spec Grav, UA: 1.01 (ref 1.010–1.025)
Urobilinogen, UA: 0.2 U/dL
pH, UA: 7 (ref 5.0–8.0)

## 2023-09-22 MED ORDER — ESTROGENS CONJUGATED 0.625 MG/GM VA CREA: TOPICAL_CREAM | VAGINAL | 1 refills | Status: AC

## 2023-09-22 NOTE — Progress Notes (Signed)
   Postpartum Problem Visit   Subjective:    Subjective  Robin Campbell is a 23 y.o. female who presents for a postpartum visit. She is2 weeks  postpartum following a spontaneous vaginal delivery. I have fully reviewed the prenatal and intrapartum course.   . She has been having a normal healing process, but a few days ago started having some pain and pressure in her vagina. She noted what felt like a prolapse and also feels her perineum is irritated. Bleeding: brown. Bowel function is normal. She feels she is having harder time emptying her bladder.   Postpartum depression screening: negative  Baby's course has been normal. Baby is feeding by  breast.   Review of Systems Pertinent positives noted in HPI. Remainder of comprehensive ROS otherwise negative.     Objective:     BP 111/74   Pulse 90   Ht 5\' 3"  (1.6 m)   Wt 169 lb (76.7 kg)   Breastfeeding Yes   BMI 29.94 kg/m    UA: neg leuks/nitrites/ketones. Culture sent  General:  alert, cooperative, and no distress    Pelvic:  Prolapse of anterior vaginal wall. Some cervical prolapse noted with Valsalva. Laceration well-healed. Erythema noted on inside of bilateral labia minora.    Assessment:    Normal postpartum course, mood stable. Pelvic organ prolapse   Plan:    -Discussed pelvic PT for 6 weeks PP. Referral sent. Reviewed gentle exercises to start now.  -Rx sent for vaginal estrogen cream to use nightly x 2 weeks  -6-week visit 10/20/23  Phylliss Brenner, CNM

## 2023-09-24 LAB — URINE CULTURE

## 2023-10-14 DIAGNOSIS — Z419 Encounter for procedure for purposes other than remedying health state, unspecified: Secondary | ICD-10-CM | POA: Diagnosis not present

## 2023-10-20 ENCOUNTER — Encounter: Payer: Self-pay | Admitting: Obstetrics

## 2023-10-20 ENCOUNTER — Ambulatory Visit (INDEPENDENT_AMBULATORY_CARE_PROVIDER_SITE_OTHER): Admitting: Obstetrics

## 2023-10-20 DIAGNOSIS — O3480 Maternal care for other abnormalities of pelvic organs, unspecified trimester: Secondary | ICD-10-CM

## 2023-10-20 NOTE — Progress Notes (Signed)
 Post Partum Visit Note  Robin Campbell is a 23 y.o. [phone] female who presents for a postpartum visit. She is 6 weeks postpartum following a normal spontaneous vaginal delivery.  I have fully reviewed the prenatal and intrapartum course and was present at the birth. The delivery was at [redacted]w[redacted]d.  Anesthesia: epidural. Postpartum course has been uncomplicated. Baby is doing well. He was diagnosed with SVT and is taking propranolol daily. Baby is feeding by breast. Bleeding: menses have resumed. Bowel function is normal. Bladder function is normal. Her prolapse symptoms have improved a little. She is interested in pelvic PT. She did not use the vaginal estrogen. Patient is not sexually active. Contraception method is natural family planning. Postpartum depression screening: negative. She feels her anxiety has improved significantly now that her baby is stable.    Edinburgh Postnatal Depression Scale - 10/20/23 1434       Edinburgh Postnatal Depression Scale:  In the Past 7 Days   I have been able to laugh and see the funny side of things. 0    I have looked forward with enjoyment to things. 0    I have blamed myself unnecessarily when things went wrong. 0    I have been anxious or worried for no good reason. 0    I have felt scared or panicky for no good reason. 0    Things have been getting on top of me. 0    I have been so unhappy that I have had difficulty sleeping. 0    I have felt sad or miserable. 0    I have been so unhappy that I have been crying. 0    The thought of harming myself has occurred to me. 0    Edinburgh Postnatal Depression Scale Total 0          Health Maintenance Due  Topic Date Due   HPV VACCINES (1 - 3-dose series) Never done   Meningococcal B Vaccine (1 of 2 - Standard) Never done   COVID-19 Vaccine (1 - 2024-25 season) Never done    The following portions of the patient's history were reviewed and updated as appropriate: allergies, current medications,  past medical history, past surgical history, and problem list.  Review of Systems Pertinent items are noted in HPI.  Objective:  BP 109/75   Pulse 70   Ht 5' 3 (1.6 m)   Wt 173 lb (78.5 kg)   LMP 10/16/2023 (Exact Date)   Breastfeeding Yes   BMI 30.65 kg/m    General:  alert and cooperative   Breasts:  normal  Lungs: clear to auscultation bilaterally  Heart:  regular rate and rhythm, S1, S2 normal, no murmur, click, rub or gallop  Abdomen: soft, non-tender; bowel sounds normal; no masses,  no organomegaly   Wound: N/A  GU exam:  declined       Assessment:    Normal postpartum exam.   Plan:   Essential components of care per ACOG recommendations:  1.  Mood and well being: Patient with negative depression screening today. Reviewed local resources for support.  - Patient tobacco use? No.   - hx of drug use? No.    2. Infant care and feeding:  -Patient currently breastmilk feeding? Yes. Reviewed importance of draining breast regularly to support lactation. Recommend sunflower lecithin for clogged ducts. -Social determinants of health (SDOH) reviewed in EPIC. No concerns. 3. Sexuality, contraception and birth spacing - Patient does not want a pregnancy in the  next year.   - Reviewed reproductive life planning. Reviewed contraceptive methods based on pt preferences and effectiveness.  Patient desired NFP today.     4. Sleep and fatigue -Encouraged family/partner/community support of 4 hrs of uninterrupted sleep to help with mood and fatigue  5. Physical Recovery  - Discussed patients delivery and complications. She describes her labor as good. - Patient had a normal spontaneous vaginal birth. Patient had a 1st degree laceration. Perineal healing reviewed. Patient expressed understanding - Patient has urinary incontinence? No. - Patient is safe to resume physical and sexual activity  6.  Health Maintenance - Last pap smear  Diagnosis  Date Value Ref Range Status   03/15/2023   Final   - Negative for intraepithelial lesion or malignancy (NILM)   Pap smear not done at today's visit.  -Breast Cancer screening indicated? No.   7. Chronic Disease/Pregnancy Condition follow up: None  - PCP follow up  Phylliss Brenner, CNM [facility]

## 2023-10-26 IMAGING — US US ABDOMEN LIMITED
1 series · 14 of 25 positions shown · non-contrast
Comparison: None Available.

CLINICAL DATA: RIGHT upper quadrant pain for 2 weeks with
increasing severity.

EXAM:
ULTRASOUND ABDOMEN LIMITED RIGHT UPPER QUADRANT

[Series 1: us abdomen limited ruq (liver/gb) · 14 of 65 slices shown]
[im 1/65]
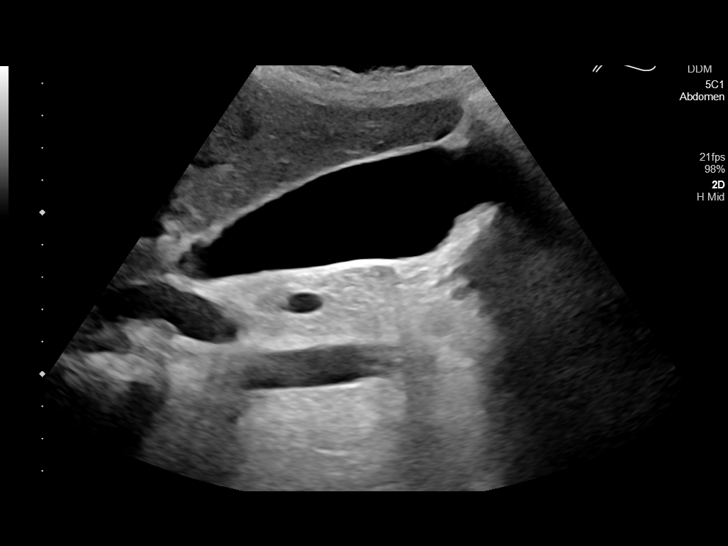
[im 6/65]
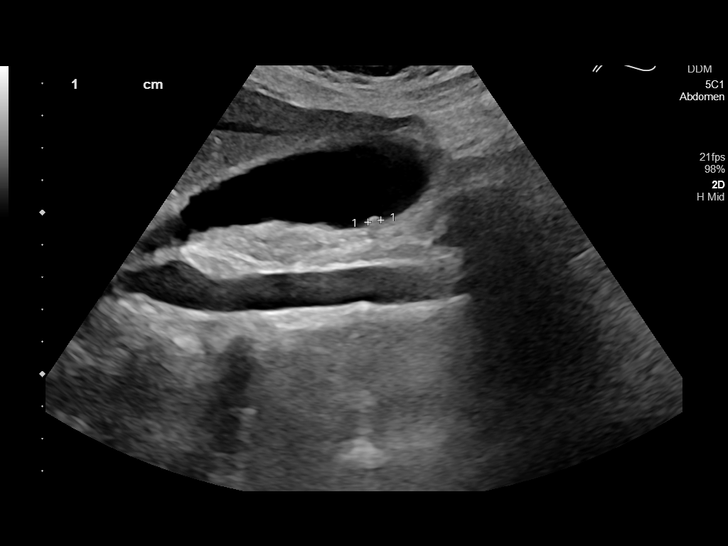
[im 11/65]
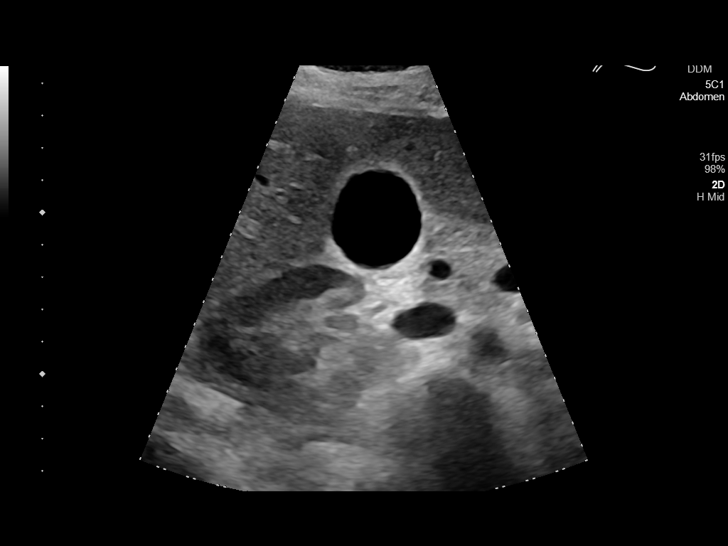
[im 17/65]
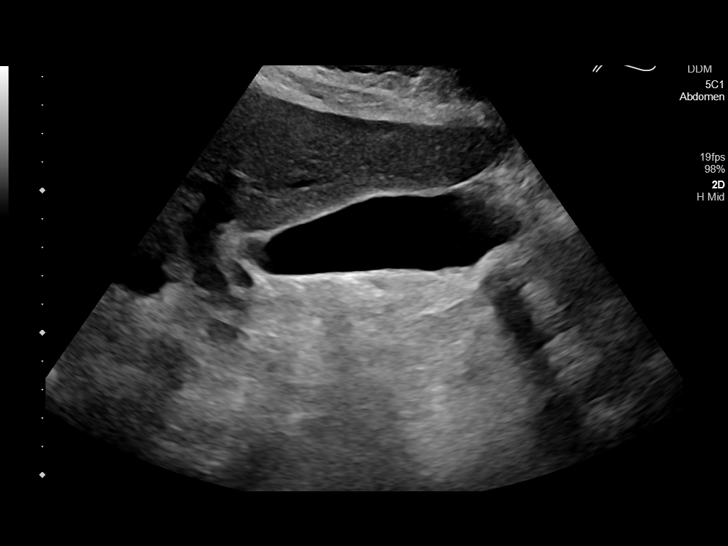
[im 22/65]
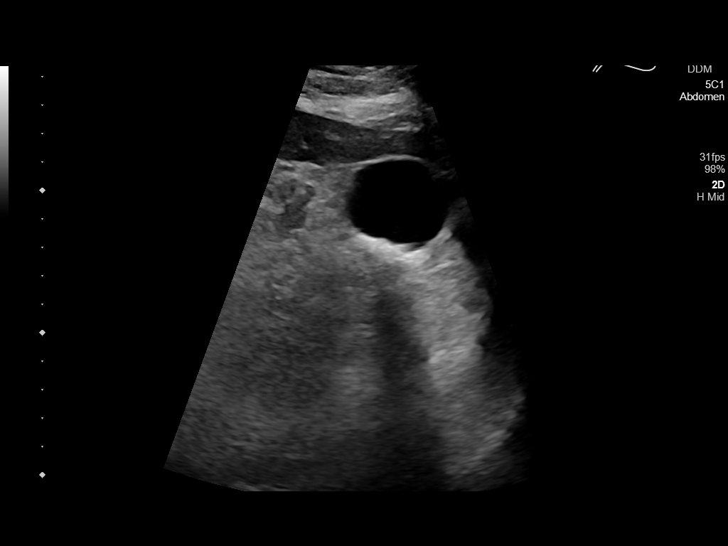
[im 25/65]
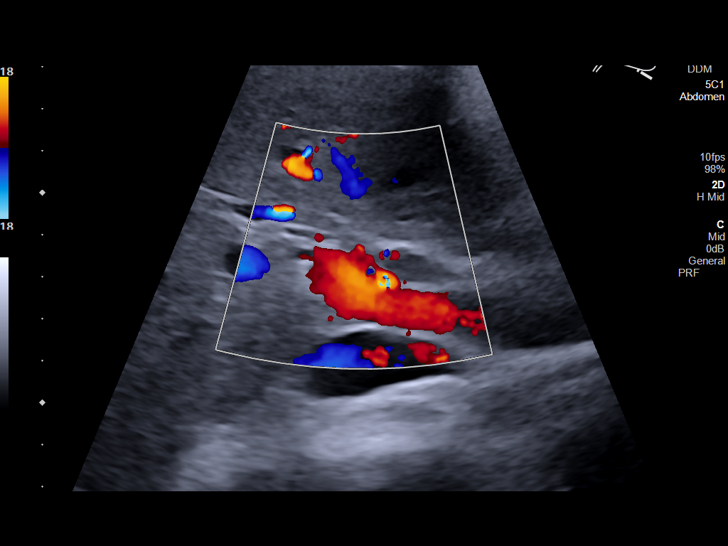
[im 30/65]
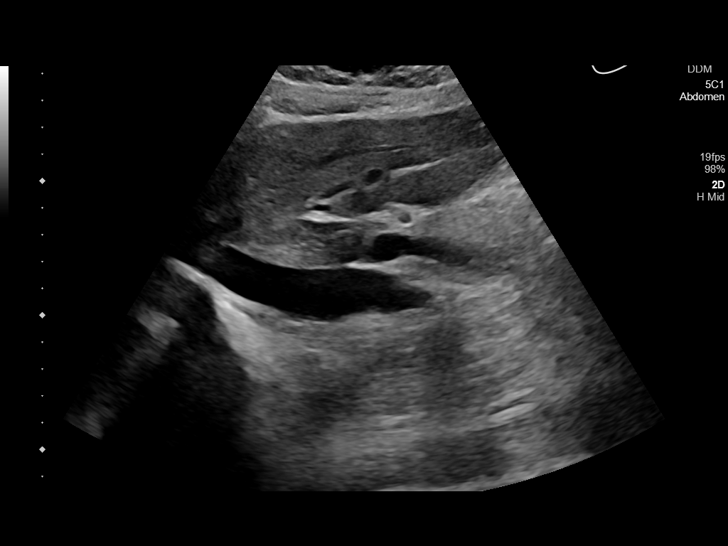
[im 35/65]
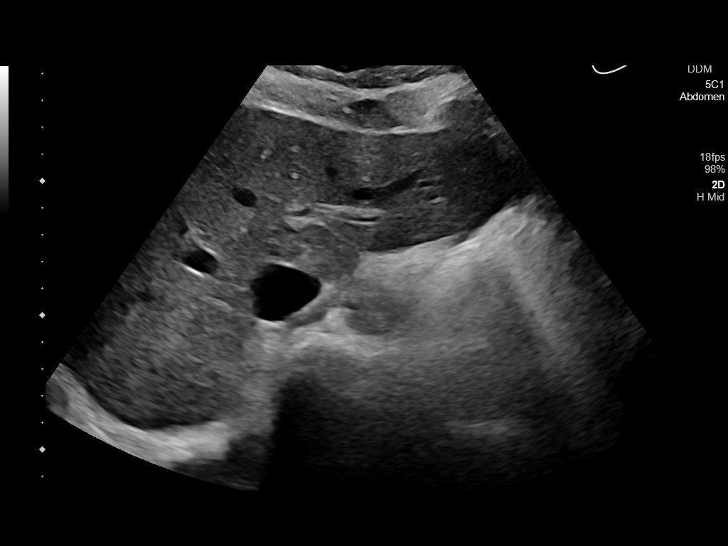
[im 41/65]
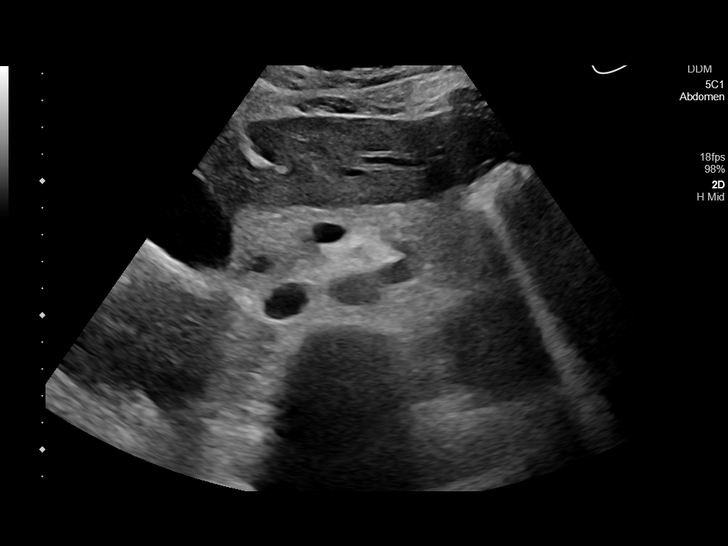
[im 43/65]
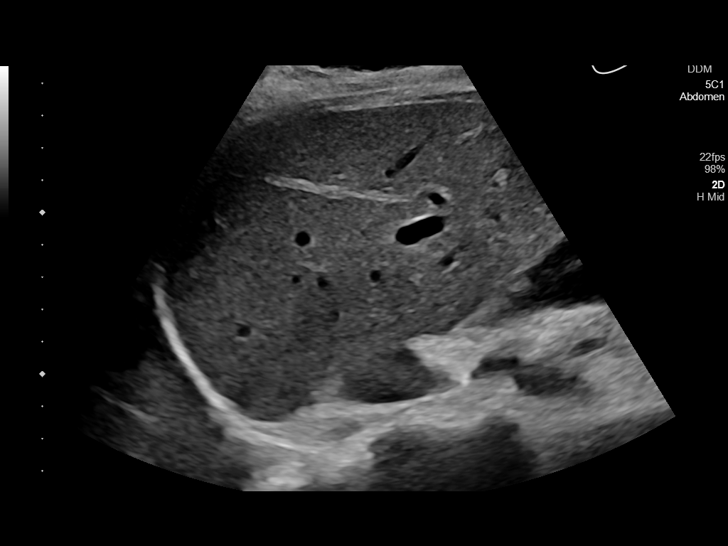
[im 49/65]
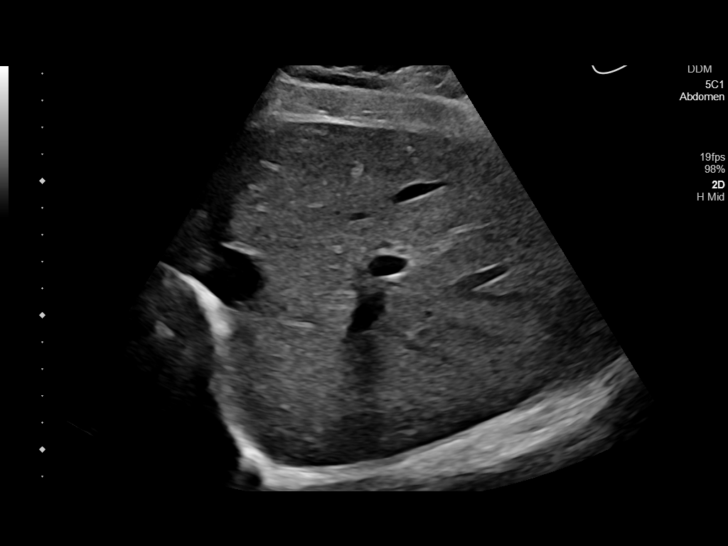
[im 54/65]
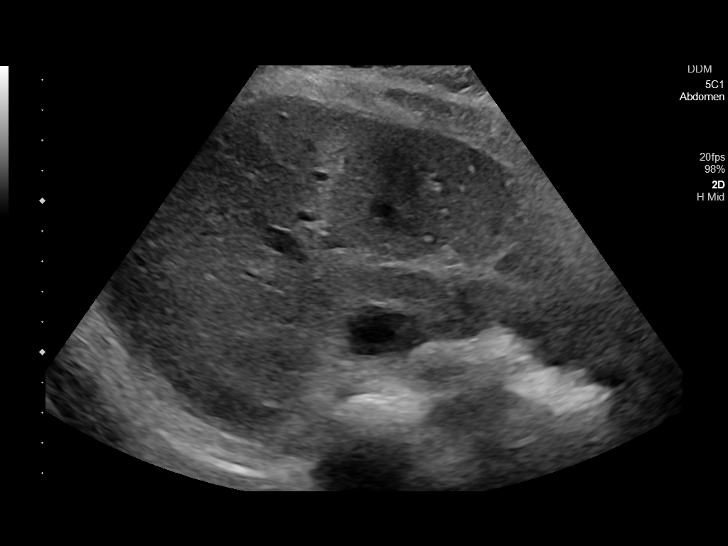
[im 59/65]
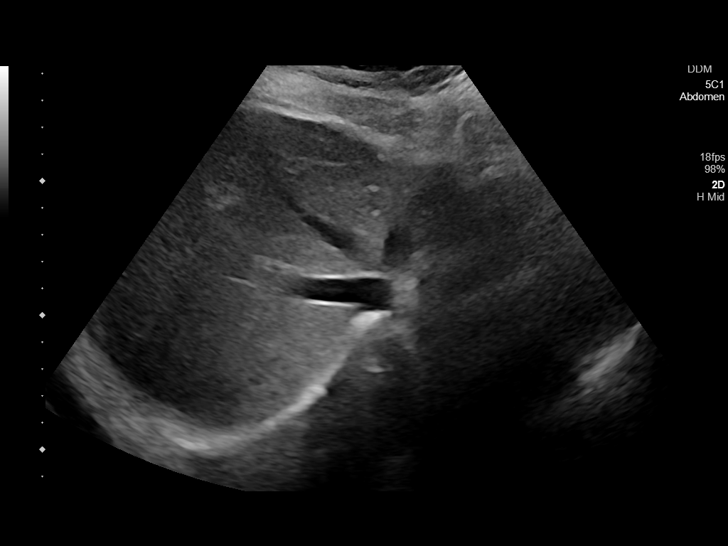
[im 65/65]
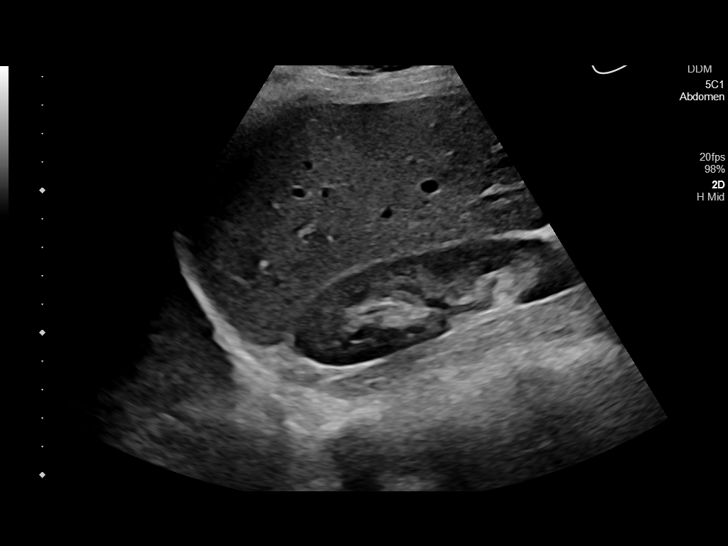

[14 of 25 positions shown; findings below may reference images not displayed]

FINDINGS: Gallbladder:

Numerous gallstones in the gallbladder. No reported tenderness over
the gallbladder. No wall thickening or pericholecystic fluid.
Largest gallstone in the gallbladder approximately 4 mm.

Common bile duct:

Diameter: 7.4 mm

Intrahepatic biliary tree with mild intrahepatic biliary duct
distension as well.

Liver:

Normal hepatic echotexture. Subtle echogenic focus in the RIGHT lobe
of the liver measuring 12 mm may represent averaging of periportal
fat or small hemangioma not well assessed. Portal vein is patent on
color Doppler imaging with normal direction of blood flow towards
the liver.

Other: None.
IMPRESSION: Cholelithiasis without acute cholecystitis.

Biliary duct obstruction with mild intra and extrahepatic biliary
duct dilation. MRI/MRCP may be helpful for further evaluation.

Question small RIGHT hepatic lesion likely hemangioma or periportal
fat.

## 2023-10-26 IMAGING — MR MR ABDOMEN WO/W CM MRCP
19 of 22 series · 43 of 48 positions shown · IV contrast (gadavist)
Comparison: Ultrasound on 09/17/2021

CLINICAL DATA: Right upper quadrant pain and vomiting. Elevated
liver function tests. Cholelithiasis. Biliary ductal dilatation on
recent ultrasound.

EXAM:
MRI ABDOMEN WITHOUT AND WITH CONTRAST (INCLUDING MRCP)
TECHNIQUE: Multiplanar multisequence MR imaging of the abdomen was performed
both before and after the administration of intravenous contrast.
Heavily T2-weighted images of the biliary and pancreatic ducts were
obtained, and three-dimensional MRCP images were rendered by post
processing.
CONTRAST:  7mL GADAVIST GADOBUTROL 1 MMOL/ML IV SOLN

[Series 3: T2 · coronal · 6.0mm · 1.06mm/px · 1 of 24 slices shown (1 of 2)]
[im 1/24]
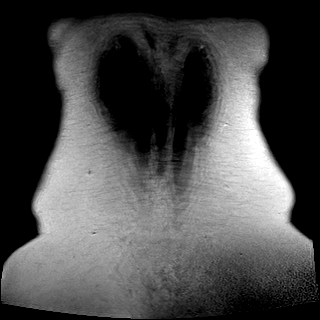

[Series 4: T2 · axial · 6.0mm · 1.06mm/px · 1 of 32 slices shown (2 of 2)]
[im 1/32]
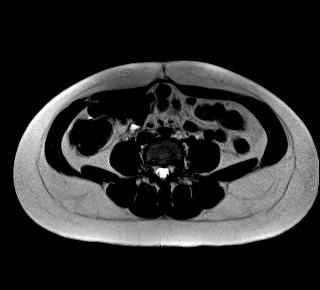

[Series 5: in & out · axial · 3.0mm · 1.06mm/px · z∈[-83,+130]mm · 3 of 72 slices shown (1 of 2)]
[im 1/72]
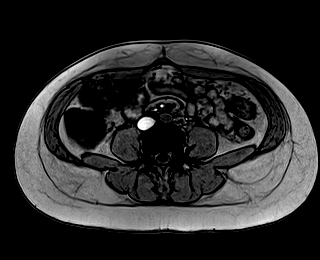
[im 36/72]
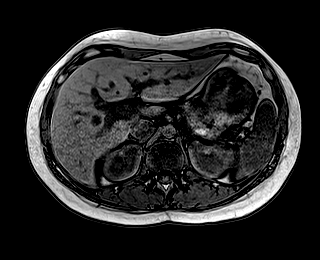
[im 72/72]
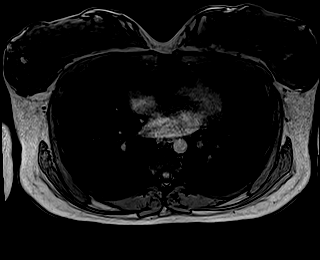

[Series 6: in & out · axial · 3.0mm · 1.06mm/px · z∈[-83,+130]mm · 3 of 72 slices shown (2 of 2)]
[im 1/72]
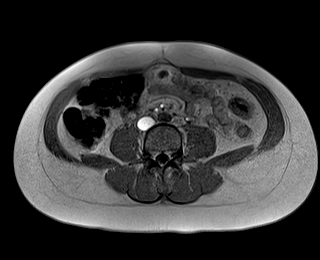
[im 36/72]
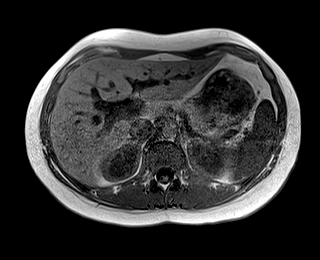
[im 72/72]
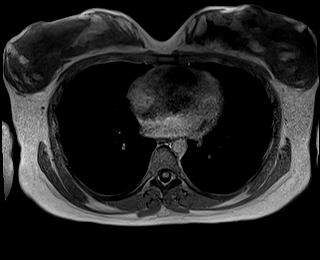

[Series 9: T2 fat-sat · axial · 6.0mm · 1.19mm/px · 1 of 30 slices shown]
[im 1/30]
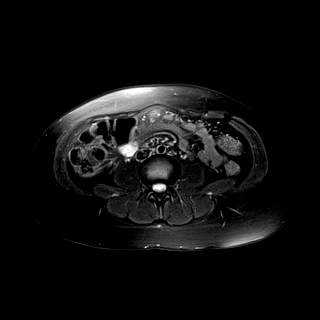

[Series 10: ax dwi_tracew · axial · 6.0mm · 1.42mm/px · z∈[-70,+139]mm · 2 of 60 slices shown]
[im 1/60]
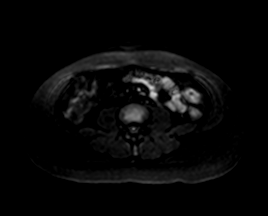
[im 60/60]
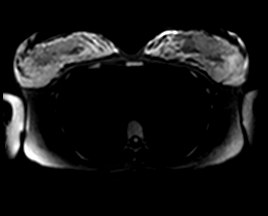

[Series 11: ax dwi_adc · axial · 6.0mm · 1.42mm/px · 1 of 30 slices shown]
[im 1/30]
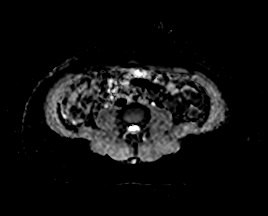

[Series 15: MRCP · coronal · 3.0mm · 1.12mm/px · 1 of 20 slices shown]
[im 1/20]
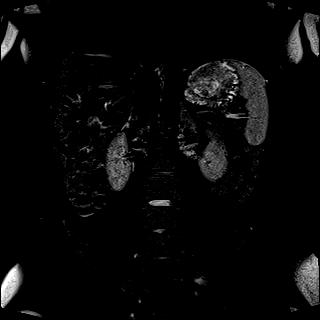

[Series 16: radials · coronal · 50.0mm · 0.78mm/px · 1 of 5 slices shown]
[im 1/5]
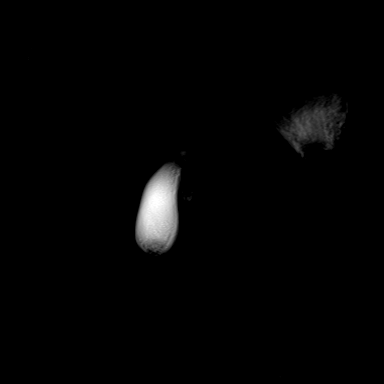

[Series 17: T1 dynamic fat-sat · axial · non-contrast · 3.0mm · 1.19mm/px · z∈[-95,+142]mm · 3 of 80 slices shown (1 of 5)]
[im 1/80]
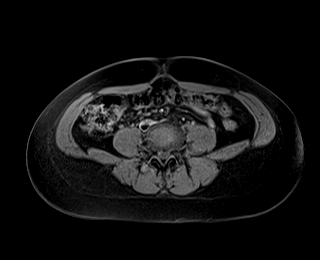
[im 40/80]
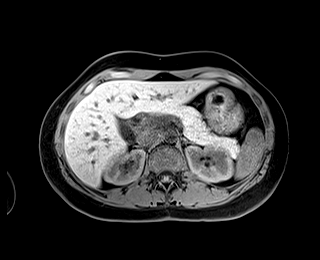
[im 80/80]
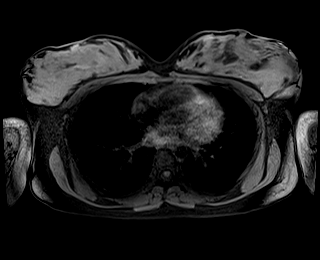

[Series 18: T1 dynamic fat-sat post-contrast · axial · 3.0mm · 1.19mm/px · z∈[-95,+142]mm · 3 of 80 slices shown (1 of 4)]
[im 1/80]
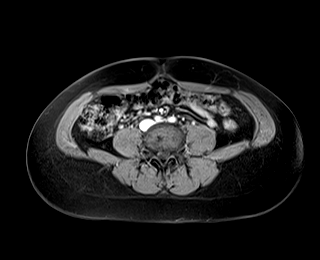
[im 40/80]
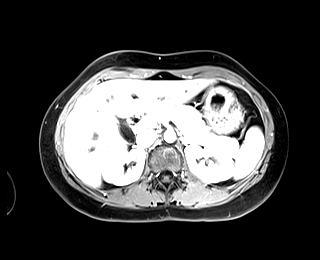
[im 80/80]
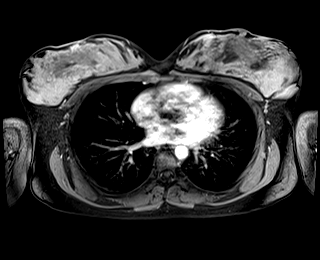

[Series 19: T1 dynamic fat-sat · axial · 3.0mm · 1.19mm/px · z∈[-95,+142]mm · 3 of 80 slices shown (2 of 5)]
[im 1/80]
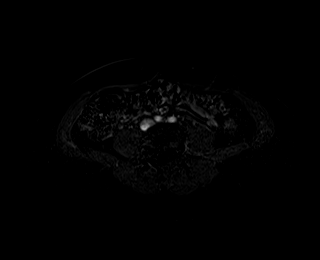
[im 40/80]
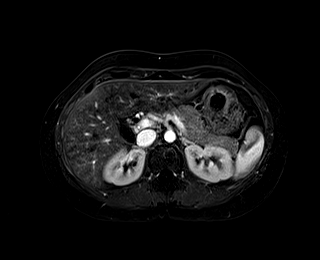
[im 80/80]
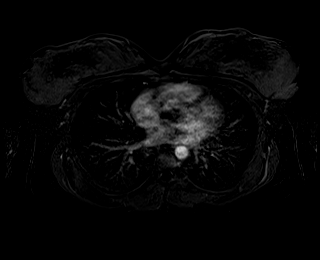

[Series 20: T1 dynamic fat-sat post-contrast · axial · 3.0mm · 1.19mm/px · z∈[-95,+142]mm · 3 of 80 slices shown (2 of 4)]
[im 1/80]
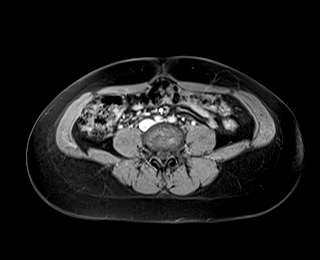
[im 40/80]
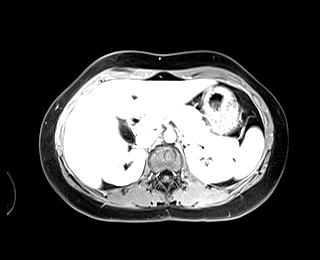
[im 80/80]
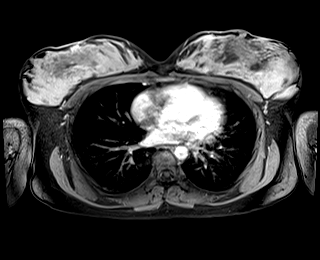

[Series 21: T1 dynamic fat-sat · axial · 3.0mm · 1.19mm/px · z∈[-95,+142]mm · 3 of 80 slices shown (3 of 5)]
[im 1/80]
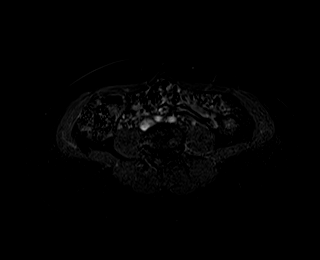
[im 40/80]
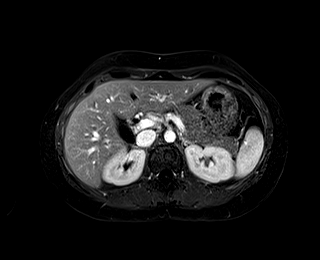
[im 80/80]
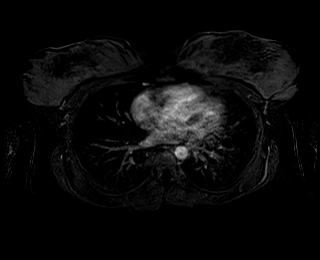

[Series 22: T1 dynamic fat-sat post-contrast · axial · 3.0mm · 1.19mm/px · z∈[-95,+142]mm · 3 of 80 slices shown (3 of 4)]
[im 1/80]
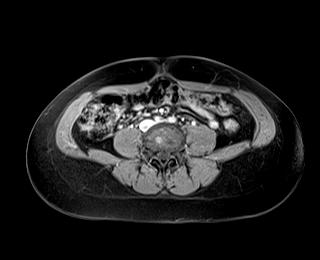
[im 40/80]
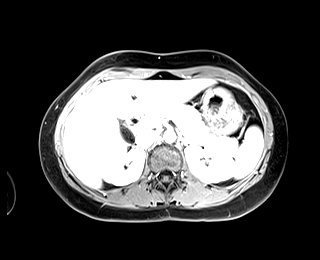
[im 80/80]
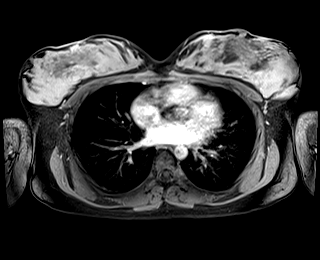

[Series 23: T1 dynamic fat-sat · axial · 3.0mm · 1.19mm/px · z∈[-95,+142]mm · 3 of 80 slices shown (4 of 5)]
[im 1/80]
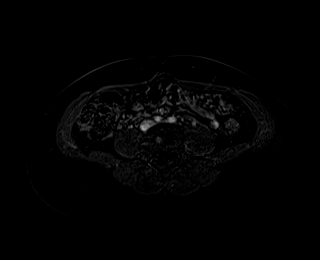
[im 40/80]
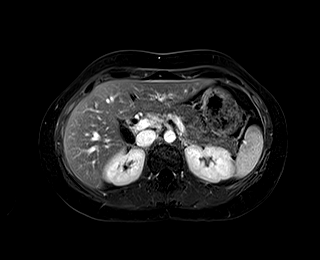
[im 80/80]
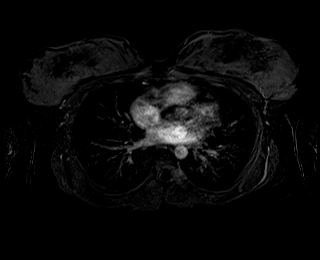

[Series 24: T1 dynamic post-contrast · coronal · 3.0mm · 1.31mm/px · 2 of 56 slices shown]
[im 1/56]
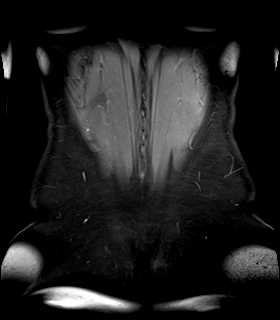
[im 56/56]
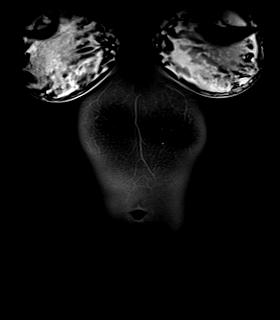

[Series 25: T1 dynamic fat-sat post-contrast · axial · 3.0mm · 1.19mm/px · z∈[-95,+142]mm · 3 of 80 slices shown (4 of 4)]
[im 1/80]
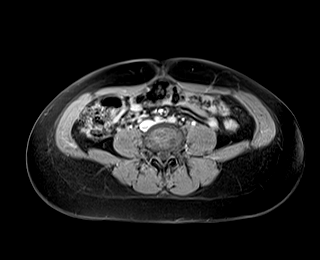
[im 40/80]
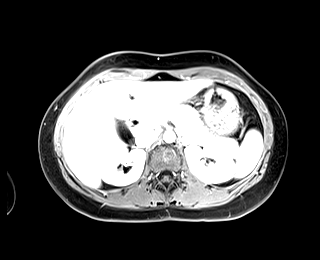
[im 80/80]
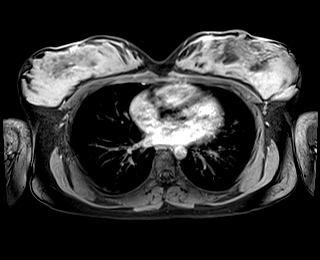

[Series 26: T1 dynamic fat-sat · axial · 3.0mm · 1.19mm/px · z∈[-95,+142]mm · 3 of 80 slices shown (5 of 5)]
[im 1/80]
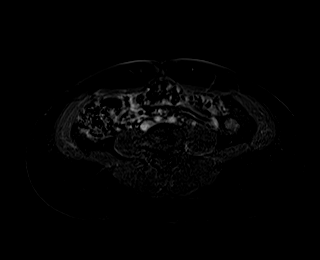
[im 40/80]
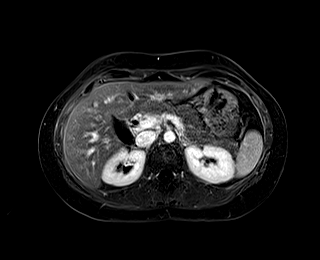
[im 80/80]
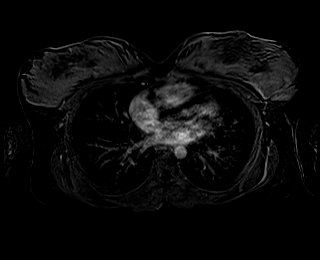

[43 of 48 positions shown; findings below may reference images not displayed]

FINDINGS: Lower chest: No acute findings.

Hepatobiliary: No hepatic masses identified. Multiple tiny
gallstones are seen, however there is no evidence of cholecystitis.
Mild biliary ductal dilatation is seen, with common bile duct
measuring 7 mm. A single tiny less than 5 mm calculus is seen in the
distal common bile duct (e.g. Image 23/13).

Pancreas: No mass or inflammatory changes. No evidence of pancreatic
ductal dilatation.

Spleen:  Within normal limits in size and appearance.

Adrenals/Urinary Tract: No masses identified. No evidence of
hydronephrosis.

Stomach/Bowel: Unremarkable.

Vascular/Lymphatic: No pathologically enlarged lymph nodes
identified. No acute vascular findings.

Other:  None.

Musculoskeletal:  No suspicious bone lesions identified.
IMPRESSION: Cholelithiasis. No radiographic evidence of cholecystitis.

Mild biliary ductal dilatation, with tiny less than 5 mm calculus in
distal common bile duct.

## 2023-11-13 DIAGNOSIS — Z419 Encounter for procedure for purposes other than remedying health state, unspecified: Secondary | ICD-10-CM | POA: Diagnosis not present

## 2023-11-26 DIAGNOSIS — Z3482 Encounter for supervision of other normal pregnancy, second trimester: Secondary | ICD-10-CM | POA: Diagnosis not present

## 2023-11-26 DIAGNOSIS — Z3483 Encounter for supervision of other normal pregnancy, third trimester: Secondary | ICD-10-CM | POA: Diagnosis not present

## 2023-12-14 DIAGNOSIS — Z419 Encounter for procedure for purposes other than remedying health state, unspecified: Secondary | ICD-10-CM | POA: Diagnosis not present

## 2024-01-14 DIAGNOSIS — Z419 Encounter for procedure for purposes other than remedying health state, unspecified: Secondary | ICD-10-CM | POA: Diagnosis not present
